# Patient Record
Sex: Male | Born: 1945
Health system: Southern US, Community
[De-identification: ages and names within clinical notes are randomized; demographics above are authoritative.]

## PROBLEM LIST (undated history)

## (undated) DIAGNOSIS — M1990 Unspecified osteoarthritis, unspecified site: Secondary | ICD-10-CM

## (undated) DIAGNOSIS — T4145XA Adverse effect of unspecified anesthetic, initial encounter: Secondary | ICD-10-CM

## (undated) DIAGNOSIS — H269 Unspecified cataract: Secondary | ICD-10-CM

## (undated) DIAGNOSIS — T8859XA Other complications of anesthesia, initial encounter: Secondary | ICD-10-CM

## (undated) DIAGNOSIS — I10 Essential (primary) hypertension: Secondary | ICD-10-CM

## (undated) DIAGNOSIS — G709 Myoneural disorder, unspecified: Secondary | ICD-10-CM

## (undated) DIAGNOSIS — K219 Gastro-esophageal reflux disease without esophagitis: Secondary | ICD-10-CM

## (undated) DIAGNOSIS — G4730 Sleep apnea, unspecified: Secondary | ICD-10-CM

## (undated) DIAGNOSIS — E785 Hyperlipidemia, unspecified: Secondary | ICD-10-CM

## (undated) HISTORY — PX: COLONOSCOPY: SHX174

## (undated) HISTORY — DX: Sleep apnea, unspecified: G47.30

## (undated) HISTORY — DX: Hyperlipidemia, unspecified: E78.5

## (undated) HISTORY — PX: JOINT REPLACEMENT: SHX530

## (undated) HISTORY — DX: Unspecified cataract: H26.9

## (undated) HISTORY — PX: CATARACT EXTRACTION: SUR2

## (undated) HISTORY — DX: Gastro-esophageal reflux disease without esophagitis: K21.9

---

## 1997-12-18 ENCOUNTER — Emergency Department (HOSPITAL_COMMUNITY): Admission: EM | Admit: 1997-12-18 | Discharge: 1997-12-18 | Payer: Self-pay | Admitting: Emergency Medicine

## 2000-02-23 ENCOUNTER — Ambulatory Visit (HOSPITAL_COMMUNITY): Admission: RE | Admit: 2000-02-23 | Discharge: 2000-02-24 | Payer: Self-pay | Admitting: Orthopedic Surgery

## 2000-02-23 ENCOUNTER — Encounter: Payer: Self-pay | Admitting: Orthopedic Surgery

## 2000-02-28 ENCOUNTER — Encounter: Admission: RE | Admit: 2000-02-28 | Discharge: 2000-05-28 | Payer: Self-pay | Admitting: Orthopedic Surgery

## 2002-02-20 ENCOUNTER — Encounter: Admission: RE | Admit: 2002-02-20 | Discharge: 2002-02-20 | Payer: Self-pay | Admitting: Family Medicine

## 2002-02-20 ENCOUNTER — Encounter: Payer: Self-pay | Admitting: Family Medicine

## 2002-10-14 ENCOUNTER — Inpatient Hospital Stay (HOSPITAL_COMMUNITY): Admission: RE | Admit: 2002-10-14 | Discharge: 2002-10-18 | Payer: Self-pay | Admitting: Orthopedic Surgery

## 2002-10-14 ENCOUNTER — Encounter: Payer: Self-pay | Admitting: Orthopedic Surgery

## 2004-04-26 ENCOUNTER — Ambulatory Visit: Payer: Self-pay | Admitting: Family Medicine

## 2004-05-03 ENCOUNTER — Ambulatory Visit: Payer: Self-pay | Admitting: Family Medicine

## 2005-02-09 ENCOUNTER — Emergency Department (HOSPITAL_COMMUNITY): Admission: EM | Admit: 2005-02-09 | Discharge: 2005-02-09 | Payer: Self-pay | Admitting: Emergency Medicine

## 2005-02-13 ENCOUNTER — Ambulatory Visit: Payer: Self-pay | Admitting: Family Medicine

## 2005-04-14 ENCOUNTER — Ambulatory Visit: Payer: Self-pay | Admitting: Family Medicine

## 2005-04-20 ENCOUNTER — Ambulatory Visit: Payer: Self-pay | Admitting: Family Medicine

## 2005-04-21 ENCOUNTER — Encounter: Admission: RE | Admit: 2005-04-21 | Discharge: 2005-04-21 | Payer: Self-pay | Admitting: Family Medicine

## 2005-05-02 ENCOUNTER — Ambulatory Visit: Payer: Self-pay | Admitting: Family Medicine

## 2005-06-22 ENCOUNTER — Ambulatory Visit: Admission: RE | Admit: 2005-06-22 | Discharge: 2005-06-22 | Payer: Self-pay | Admitting: Orthopedic Surgery

## 2005-07-18 ENCOUNTER — Ambulatory Visit: Payer: Self-pay | Admitting: Physical Medicine & Rehabilitation

## 2005-07-18 ENCOUNTER — Inpatient Hospital Stay (HOSPITAL_COMMUNITY): Admission: RE | Admit: 2005-07-18 | Discharge: 2005-07-23 | Payer: Self-pay | Admitting: Orthopedic Surgery

## 2005-07-18 ENCOUNTER — Encounter (INDEPENDENT_AMBULATORY_CARE_PROVIDER_SITE_OTHER): Payer: Self-pay | Admitting: *Deleted

## 2006-05-24 ENCOUNTER — Ambulatory Visit: Payer: Self-pay | Admitting: Family Medicine

## 2006-05-24 LAB — CONVERTED CEMR LAB
AST: 19 units/L (ref 0–37)
Alkaline Phosphatase: 108 units/L (ref 39–117)
Basophils Relative: 0.7 % (ref 0.0–1.0)
Calcium: 10.1 mg/dL (ref 8.4–10.5)
Chloride: 97 meq/L (ref 96–112)
Eosinophil percent: 3.4 % (ref 0.0–5.0)
GFR calc non Af Amer: 72 mL/min
Glomerular Filtration Rate, Af Am: 88 mL/min/{1.73_m2}
HDL: 41.2 mg/dL (ref >39.0)
Hemoglobin: 14.3 g/dL (ref 13.0–17.0)
LDL DIRECT: 153.4 mg/dL
Lymphocytes Relative: 45.6 % (ref 12.0–46.0)
MCHC: 34.4 g/dL (ref 30.0–36.0)
Monocytes Absolute: 0.5 10*3/uL (ref 0.2–0.7)
Monocytes Relative: 8.7 % (ref 3.0–11.0)
Neutro Abs: 2.3 10*3/uL (ref 1.4–7.7)
PSA: 1.18 ng/mL (ref 0.10–4.00)
Potassium: 4 meq/L (ref 3.5–5.1)
RDW: 11.4 % — ABNORMAL LOW (ref 11.5–14.6)
Total Protein: 7.6 g/dL (ref 6.0–8.3)
VLDL: 12 mg/dL (ref 0–40)

## 2006-06-06 ENCOUNTER — Ambulatory Visit: Payer: Self-pay | Admitting: Family Medicine

## 2006-07-02 ENCOUNTER — Ambulatory Visit: Payer: Self-pay | Admitting: Gastroenterology

## 2006-07-16 ENCOUNTER — Encounter: Payer: Self-pay | Admitting: Gastroenterology

## 2006-07-16 ENCOUNTER — Ambulatory Visit: Payer: Self-pay | Admitting: Gastroenterology

## 2006-07-16 ENCOUNTER — Encounter: Payer: Self-pay | Admitting: Family Medicine

## 2006-07-25 ENCOUNTER — Ambulatory Visit: Payer: Self-pay | Admitting: Family Medicine

## 2006-07-30 ENCOUNTER — Ambulatory Visit: Payer: Self-pay | Admitting: Family Medicine

## 2006-08-22 ENCOUNTER — Ambulatory Visit: Payer: Self-pay | Admitting: Family Medicine

## 2006-12-21 DIAGNOSIS — N259 Disorder resulting from impaired renal tubular function, unspecified: Secondary | ICD-10-CM | POA: Insufficient documentation

## 2006-12-21 DIAGNOSIS — I10 Essential (primary) hypertension: Secondary | ICD-10-CM | POA: Insufficient documentation

## 2006-12-27 ENCOUNTER — Telehealth: Payer: Self-pay | Admitting: Family Medicine

## 2007-05-06 ENCOUNTER — Ambulatory Visit: Payer: Self-pay | Admitting: Family Medicine

## 2007-05-06 LAB — CONVERTED CEMR LAB
AST: 18 units/L (ref 0–37)
Alkaline Phosphatase: 95 units/L (ref 39–117)
Blood in Urine, dipstick: NEGATIVE
CO2: 32 meq/L (ref 19–32)
Chloride: 96 meq/L (ref 96–112)
Cholesterol: 213 mg/dL (ref 0–200)
Creatinine, Ser: 1 mg/dL (ref 0.4–1.5)
Eosinophils Absolute: 0.2 10*3/uL (ref 0.0–0.6)
Eosinophils Relative: 2.8 % (ref 0.0–5.0)
GFR calc non Af Amer: 80 mL/min
Glucose, Urine, Semiquant: NEGATIVE
HDL: 34.9 mg/dL — ABNORMAL LOW (ref >39.0)
Lymphocytes Relative: 47 % — ABNORMAL HIGH (ref 12.0–46.0)
MCHC: 35.1 g/dL (ref 30.0–36.0)
Monocytes Absolute: 0.7 10*3/uL (ref 0.2–0.7)
Neutro Abs: 2.5 10*3/uL (ref 1.4–7.7)
Platelets: 268 10*3/uL (ref 150–400)
RBC: 4.4 M/uL (ref 4.22–5.81)
RDW: 11.4 % — ABNORMAL LOW (ref 11.5–14.6)
Sodium: 138 meq/L (ref 135–145)
Specific Gravity, Urine: 1.02
Triglycerides: 75 mg/dL (ref 0–149)
VLDL: 15 mg/dL (ref 0–40)
pH: 6.5

## 2007-05-17 ENCOUNTER — Ambulatory Visit: Payer: Self-pay | Admitting: Family Medicine

## 2007-05-17 DIAGNOSIS — M1710 Unilateral primary osteoarthritis, unspecified knee: Secondary | ICD-10-CM

## 2007-05-17 DIAGNOSIS — N529 Male erectile dysfunction, unspecified: Secondary | ICD-10-CM | POA: Insufficient documentation

## 2007-05-17 DIAGNOSIS — IMO0002 Reserved for concepts with insufficient information to code with codable children: Secondary | ICD-10-CM | POA: Insufficient documentation

## 2007-05-17 DIAGNOSIS — L301 Dyshidrosis [pompholyx]: Secondary | ICD-10-CM | POA: Insufficient documentation

## 2007-07-15 DIAGNOSIS — J45990 Exercise induced bronchospasm: Secondary | ICD-10-CM | POA: Insufficient documentation

## 2007-07-18 ENCOUNTER — Ambulatory Visit: Payer: Self-pay | Admitting: Family Medicine

## 2007-07-18 DIAGNOSIS — J069 Acute upper respiratory infection, unspecified: Secondary | ICD-10-CM | POA: Insufficient documentation

## 2007-08-12 DIAGNOSIS — R259 Unspecified abnormal involuntary movements: Secondary | ICD-10-CM | POA: Insufficient documentation

## 2007-08-14 ENCOUNTER — Ambulatory Visit: Payer: Self-pay | Admitting: Family Medicine

## 2007-10-31 ENCOUNTER — Ambulatory Visit: Payer: Self-pay | Admitting: Family Medicine

## 2007-10-31 LAB — CONVERTED CEMR LAB
Cholesterol: 188 mg/dL (ref 0–200)
HDL: 29.9 mg/dL — ABNORMAL LOW (ref >39.0)

## 2007-11-07 ENCOUNTER — Ambulatory Visit: Payer: Self-pay | Admitting: Family Medicine

## 2008-06-25 ENCOUNTER — Ambulatory Visit: Payer: Self-pay | Admitting: Family Medicine

## 2008-06-25 LAB — CONVERTED CEMR LAB
AST: 22 units/L (ref 0–37)
Albumin: 3.7 g/dL (ref 3.5–5.2)
Alkaline Phosphatase: 83 units/L (ref 39–117)
BUN: 14 mg/dL (ref 6–23)
Basophils Relative: 0.6 % (ref 0.0–3.0)
Bilirubin, Direct: 0.2 mg/dL (ref 0.0–0.3)
CO2: 33 meq/L — ABNORMAL HIGH (ref 19–32)
Cholesterol: 187 mg/dL (ref 0–200)
GFR calc Af Amer: 87 mL/min
Glucose, Urine, Semiquant: NEGATIVE
HCT: 40.4 % (ref 39.0–52.0)
HDL: 31.9 mg/dL — ABNORMAL LOW (ref >39.0)
Hemoglobin: 13.8 g/dL (ref 13.0–17.0)
LDL Cholesterol: 137 mg/dL — ABNORMAL HIGH (ref 0–99)
MCV: 93.9 fL (ref 78.0–100.0)
Monocytes Absolute: 0.6 10*3/uL (ref 0.1–1.0)
Neutro Abs: 2.5 10*3/uL (ref 1.4–7.7)
Neutrophils Relative %: 42.1 % — ABNORMAL LOW (ref 43.0–77.0)
Potassium: 3.9 meq/L (ref 3.5–5.1)
Specific Gravity, Urine: 1.015
Total Bilirubin: 1.3 mg/dL — ABNORMAL HIGH (ref 0.3–1.2)
Triglycerides: 90 mg/dL (ref 0–149)
VLDL: 18 mg/dL (ref 0–40)
WBC Urine, dipstick: NEGATIVE
pH: 7.5

## 2008-07-02 ENCOUNTER — Ambulatory Visit: Payer: Self-pay | Admitting: Family Medicine

## 2008-07-14 ENCOUNTER — Ambulatory Visit: Payer: Self-pay | Admitting: Family Medicine

## 2008-07-14 LAB — CONVERTED CEMR LAB
Bilirubin Urine: NEGATIVE
Glucose, Urine, Semiquant: NEGATIVE
Protein, U semiquant: NEGATIVE
Urobilinogen, UA: 1
WBC Urine, dipstick: NEGATIVE
pH: 6

## 2008-08-12 ENCOUNTER — Ambulatory Visit: Payer: Self-pay | Admitting: Family Medicine

## 2008-09-08 ENCOUNTER — Ambulatory Visit: Payer: Self-pay | Admitting: Family Medicine

## 2008-10-22 DIAGNOSIS — M5030 Other cervical disc degeneration, unspecified cervical region: Secondary | ICD-10-CM | POA: Insufficient documentation

## 2008-10-23 ENCOUNTER — Ambulatory Visit: Payer: Self-pay | Admitting: Family Medicine

## 2008-10-26 ENCOUNTER — Ambulatory Visit: Payer: Self-pay | Admitting: Family Medicine

## 2009-07-08 ENCOUNTER — Ambulatory Visit: Payer: Self-pay | Admitting: Family Medicine

## 2009-07-08 LAB — CONVERTED CEMR LAB
AST: 17 units/L (ref 0–37)
BUN: 12 mg/dL (ref 6–23)
Bilirubin, Direct: 0.1 mg/dL (ref 0.0–0.3)
CO2: 31 meq/L (ref 19–32)
Chloride: 101 meq/L (ref 96–112)
Cholesterol: 204 mg/dL — ABNORMAL HIGH (ref 0–200)
Creatinine, Ser: 1 mg/dL (ref 0.4–1.5)
Eosinophils Relative: 0.7 % (ref 0.0–5.0)
HDL: 45.2 mg/dL (ref >39.00)
Hemoglobin: 13.4 g/dL (ref 13.0–17.0)
Ketones, urine, test strip: NEGATIVE
MCHC: 33.6 g/dL (ref 30.0–36.0)
Monocytes Absolute: 0.8 10*3/uL (ref 0.1–1.0)
Monocytes Relative: 11.2 % (ref 3.0–12.0)
PSA: 1.55 ng/mL (ref 0.10–4.00)
RBC: 4.29 M/uL (ref 4.22–5.81)
RDW: 11 % — ABNORMAL LOW (ref 11.5–14.6)
Total CHOL/HDL Ratio: 5
Triglycerides: 71 mg/dL (ref 0.0–149.0)
Urobilinogen, UA: 4
VLDL: 14.2 mg/dL (ref 0.0–40.0)
WBC Urine, dipstick: NEGATIVE
pH: 5.5

## 2009-07-15 ENCOUNTER — Ambulatory Visit: Payer: Self-pay | Admitting: Family Medicine

## 2009-07-27 ENCOUNTER — Telehealth: Payer: Self-pay | Admitting: Family Medicine

## 2010-01-11 ENCOUNTER — Ambulatory Visit: Payer: Self-pay | Admitting: Family Medicine

## 2010-01-11 LAB — CONVERTED CEMR LAB
BUN: 14 mg/dL (ref 6–23)
Calcium: 9.8 mg/dL (ref 8.4–10.5)
Chloride: 102 meq/L (ref 96–112)
Creatinine, Ser: 1 mg/dL (ref 0.4–1.5)
Glucose, Bld: 105 mg/dL — ABNORMAL HIGH (ref 70–99)
Hgb A1c MFr Bld: 5.5 % (ref 4.6–6.5)
LDL Cholesterol: 136 mg/dL — ABNORMAL HIGH (ref 0–99)
VLDL: 13.2 mg/dL (ref 0.0–40.0)

## 2010-01-18 ENCOUNTER — Ambulatory Visit: Payer: Self-pay | Admitting: Family Medicine

## 2010-01-18 DIAGNOSIS — E739 Lactose intolerance, unspecified: Secondary | ICD-10-CM | POA: Insufficient documentation

## 2010-06-14 NOTE — Progress Notes (Signed)
Summary: rx sent  Phone Note Refill Request Message from:  Fax from Pharmacy on July 27, 2009 12:02 PM  Refills Requested: Medication #1:  LIDEX 0.05 %  CREA apply at bedtime Initial call taken by: Kern Reap CMA Duncan Dull),  July 27, 2009 12:04 PM    Prescriptions: LIDEX 0.05 %  CREA (FLUOCINONIDE) apply at bedtime  #60 grms. x 4   Entered by:   Kern Reap CMA (AAMA)   Authorized by:   Roderick Pee MD   Signed by:   Kern Reap CMA (AAMA) on 07/27/2009   Method used:   [facility] to        [id number]       [address]       [place], [place]  [address]       Ph: [phone] or [phone]       Fax: [phone]   RxID:   [id number]

## 2010-06-14 NOTE — Assessment & Plan Note (Signed)
Summary: 6 month rov/njr   Vital Signs:  Patient profile:   65 year old male Weight:      268 pounds Temp:     98 degrees F oral Pulse rate:   70 / minute Pulse rhythm:   regular BP sitting:   140 / 80  (right arm) Flu Vaccine Consent Questions     Do you have a history of severe allergic reactions to this vaccine? no    Any prior history of allergic reactions to egg and/or gelatin? no    Do you have a sensitivity to the preservative Thimersol? no    Do you have a past history of Guillan-Barre Syndrome? no    Do you currently have an acute febrile illness? no    Have you ever had a severe reaction to latex? no    Vaccine information given and explained to patient? yes    Are you currently pregnant? no    Lot Number:[id number]   Exp Date:11/12/2010   Site Given  Left Deltoid IM  History of Present Illness: [name] is a 65 year old, married male, nonsmoker, who comes in today for evaluation of hypertension, and glucose intolerance, and renal insufficiency.  His blood pressure and is treated with Norvasc 10 mg daily, hydrochlorothiazide 25 mg daily.  BP at home 139/80.  BP here today by me 140/80.  Right arm sitting position.  His lipids have normalized on diet and exercise.  Renal function is improved with a GFR of 94.  Fasting blood sugar 105 with a hemoglobin A1c of 5.5%.  He is on no medication for his diabetes.  He is controlling it with diet and daily walking.  Allergies: No Known Drug Allergies  Past History:  Past medical, surgical, family and social histories (including risk factors) reviewed for relevance to current acute and chronic problems.  Past Medical History: Reviewed history from 05/17/2007 and no changes required. 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 TKR-right Total knee replacement  L  Family  History: Reviewed history from 05/17/2007 and no changes required. Family History Hypertension  Social History: Reviewed history from 05/17/2007 and no changes required. Retired Married Never Smoked Alcohol use-no Drug use-no Regular exercise-yes  Review of Systems      See HPI  Physical Exam  General:  Well-developed,well-nourished,in no acute distress; alert,appropriate and cooperative throughout examination Heart:  140/80 right arm sitting position   Impression & Recommendations:  Problem # 1:  HYPERTENSION (ICD-401.9) Assessment Improved  His updated medication list for this problem includes:    Norvasc 10 Mg Tabs (Amlodipine besylate) .Marland Kitchen... Take 1 tablet by mouth every morning    Hydrochlorothiazide 25 Mg Tabs (Hydrochlorothiazide) .Marland Kitchen... Take 1 tablet by mouth every morning  Problem # 2:  RENAL INSUFFICIENCY (ICD-588.9) Assessment: Improved  Problem # 3:  IMPAIRED GLUCOSE TOLERANCE (ICD-271.3) Assessment: Improved  Complete Medication List: 1)  Adult Aspirin Ec Low Strength 81 Mg Tbec (Aspirin) 2)  Viagra 100 Mg Tabs (Sildenafil citrate) .... Uad 3)  Lidex 0.05 % Crea (Fluocinonide) .... Apply at bedtime 4)  Norvasc 10 Mg Tabs (Amlodipine besylate) .... Take 1 tablet by mouth every morning 5)  Hydrochlorothiazide 25 Mg Tabs (Hydrochlorothiazide) .... Take 1 tablet by mouth every morning 6)  Prednisone 20 Mg Tabs (Prednisone) .... Uad 7)  Flexeril 10 Mg Tabs (Cyclobenzaprine hcl) .... Take 1 tablet by mouth three times a day 8)  Vicodin Es 7.5-750 Mg Tabs (Hydrocodone-acetaminophen) .... Take 1 tablet by mouth three times a day  Other Orders: Admin 1st Vaccine (98119) Flu Vaccine 53yrs + ([address]782)  Patient Instructions: 1)  Please schedule a follow-up appointment in 6 months. 2)  BMP prior to visit, ICD-9: 3)  Hepatic Panel prior to visit, ICD-9: 4)  Lipid Panel prior to visit, ICD-9: 5)  TSH prior to visit, ICD-9: 6)  CBC w/ Diff prior to visit, ICD-9: 7)   PSA prior to visit, ICD-9: 8)  HbgA1C prior to visit, ICD-9:

## 2010-06-14 NOTE — Procedures (Signed)
Summary: Colonoscopy Report/[facility]  Colonoscopy Report/[facility]   Imported By: Maryln Gottron 10/06/2009 14:33:42  _____________________________________________________________________  External Attachment:    Type:   Image     Comment:   External Document

## 2010-06-14 NOTE — Assessment & Plan Note (Signed)
Summary: cpx//ccm   Vital Signs:  Patient profile:   65 year old male Height:      68.25 inches Weight:      268 pounds BMI:     40.60 Temp:     98.1 degrees F oral BP sitting:   150 / 90  (left arm) Cuff size:   regular  Vitals Entered By: Kern Reap CMA Duncan Dull) (July 15, 2009 2:38 PM)  Reason for Visit cpx  History of Present Illness: [name] is a 65 year old, married male, nonsmoker, who comes in today for evaluation of multiple issues.  Is a history of underlying erectile dysfunction for which he takes for her 100 mg p.r.n. working well.  He also has underlying hypertension.  He takes Norvasc 10 mg daily, hydrochlorothiazide, 25 mg daily, one adult aspirin daily.  BP at home 140/80.  He also has a history of renal insufficiency from his hypertension.  Will monitor renal functions.  He gets routine eye care, not dental care, colonoscopy, normal in GI, tetanus, 2004, seasonal flu 2010.  He has had both knees replaced for severe degenerative joint disease.  He is now able to walk and is begun losing weight.  Weight today 268  Allergies: No Known Drug Allergies  Past History:  Past medical, surgical, family and social histories (including risk factors) reviewed, and no changes noted (except as noted below).  Past Medical History: Reviewed history from 05/17/2007 and no changes required. 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 TKR-right Total knee replacement  L  Family History: Reviewed history from 05/17/2007 and no changes required. Family History Hypertension  Social History: Reviewed history from 05/17/2007 and no changes required. Retired Married Never Smoked Alcohol use-no Drug use-no Regular exercise-yes  Review of Systems      See HPI  Physical Exam  General:  Well-developed,well-nourished,in no  acute distress; alert,appropriate and cooperative throughout examination Head:  Normocephalic and atraumatic without obvious abnormalities. No apparent alopecia or balding. Eyes:  No corneal or conjunctival inflammation noted. EOMI. Perrla. Funduscopic exam benign, without hemorrhages, exudates or papilledema. Vision grossly normal. Ears:  External ear exam shows no significant lesions or deformities.  Otoscopic examination reveals clear canals, tympanic membranes are intact bilaterally without bulging, retraction, inflammation or discharge. Hearing is grossly normal bilaterally. Nose:  External nasal examination shows no deformity or inflammation. Nasal mucosa are pink and moist without lesions or exudates. Mouth:  Oral mucosa and oropharynx without lesions or exudates.  Teeth in good repair. Neck:  No deformities, masses, or tenderness noted. Chest Wall:  No deformities, masses, tenderness or gynecomastia noted. Breasts:  No masses or gynecomastia noted Lungs:  Normal respiratory effort, chest expands symmetrically. Lungs are clear to auscultation, no crackles or wheezes. Heart:  Normal rate and regular rhythm. S1 and S2 normal without gallop, murmur, click, rub or other extra sounds. Abdomen:  Bowel sounds positive,abdomen soft and non-tender without masses, organomegaly or hernias noted. Rectal:  No external abnormalities noted. Normal sphincter tone. No rectal masses or tenderness. Genitalia:  Testes bilaterally descended without nodularity, tenderness or masses. No scrotal masses or lesions. No penis lesions or urethral discharge. Prostate:  Prostate gland firm and smooth, no enlargement, nodularity, tenderness, mass, asymmetry or induration. Msk:  No deformity or scoliosis noted of thoracic or lumbar spine.   Pulses:  R and L carotid,radial,femoral,dorsalis pedis and posterior tibial pulses are full and  equal bilaterally Extremities:  No clubbing, cyanosis, edema, or deformity noted with  normal full range of motion of all joints.   Neurologic:  No cranial nerve deficits noted. Station and gait are normal. Plantar reflexes are down-going bilaterally. DTRs are symmetrical throughout. Sensory, motor and coordinative functions appear intact. Skin:  total body skin exam normal except for scars on both knees from previous total joint replacements Cervical Nodes:  No lymphadenopathy noted Axillary Nodes:  No palpable lymphadenopathy Inguinal Nodes:  No significant adenopathy Psych:  Cognition and judgment appear intact. Alert and cooperative with normal attention span and concentration. No apparent delusions, illusions, hallucinations   Impression & Recommendations:  Problem # 1:  ERECTILE DYSFUNCTION, ORGANIC (ICD-607.84) Assessment Improved  His updated medication list for this problem includes:    Viagra 100 Mg Tabs (Sildenafil citrate) ..... Uad  Orders: Prescription Created Electronically [phone])  Problem # 2:  DYSHIDROSIS (ICD-705.81) Assessment: Unchanged  Orders: Prescription Created Electronically ([phone])  Problem # 3:  RENAL INSUFFICIENCY (ICD-588.9) Assessment: Improved  Orders: Prescription Created Electronically ([phone])  Problem # 4:  HYPERTENSION (ICD-401.9) Assessment: Improved  His updated medication list for this problem includes:    Norvasc 10 Mg Tabs (Amlodipine besylate) .Marland Kitchen... Take 1 tablet by mouth every morning    Hydrochlorothiazide 25 Mg Tabs (Hydrochlorothiazide) .Marland Kitchen... Take 1 tablet by mouth every morning  Orders: Prescription Created Electronically ([phone]) EKG w/ Interpretation (93000)  Problem # 5:  Preventive Health Care (ICD-V70.0) Assessment: Unchanged  Complete Medication List: 1)  Adult Aspirin Ec Low Strength 81 Mg Tbec (Aspirin) 2)  Viagra 100 Mg Tabs (Sildenafil citrate) .... Uad 3)  Lidex 0.05 % Crea (Fluocinonide) .... Apply at bedtime 4)  Norvasc 10 Mg Tabs (Amlodipine besylate) .... Take 1 tablet by mouth every  morning 5)  Hydrochlorothiazide 25 Mg Tabs (Hydrochlorothiazide) .... Take 1 tablet by mouth every morning 6)  Prednisone 20 Mg Tabs (Prednisone) .... Uad 7)  Flexeril 10 Mg Tabs (Cyclobenzaprine hcl) .... Take 1 tablet by mouth three times a day 8)  Vicodin Es 7.5-750 Mg Tabs (Hydrocodone-acetaminophen) .... Take 1 tablet by mouth three times a day  Patient Instructions: 1)  continue your walking program and weight loss.  Follow-up in 6 months........... 2)  BMP prior to visit, ICD-9:               3)  HbgA1C prior to visit, ICD-9: 4)  Please schedule a follow-up appointment in 1 year. 5)  Schedule a colonoscopy/sigmoidoscopy to help detect colon cancer. 6)  Take an Aspirin every day. 7)  apply Lidex and over-the-counter antifungal cream to the area between your toes, nightly until the skin heals Prescriptions: HYDROCHLOROTHIAZIDE 25 MG TABS (HYDROCHLOROTHIAZIDE) Take 1 tablet by mouth every morning  #100 x 3   Entered and Authorized by:   Roderick Pee MD   Signed by:   Roderick Pee MD on 07/15/2009   Method used:   [facility]  [address] [id number]       [address]       [place], [place]  [address]       Ph: [phone] or [phone]       Fax: [phone]   RxID:   [id number] NORVASC 10 MG TABS (AMLODIPINE BESYLATE) Take 1 tablet by mouth every morning  #100 x 3   Entered and Authorized by:   Roderick Pee MD  Signed by:   Roderick Pee MD on 07/15/2009   Method used:   [facility]        [facility]  [address] [id number]       [address]       [place], [place]  [address]       Ph: [phone] or [phone]       Fax: [phone]   RxID:   [id number] LIDEX 0.05 %  CREA (FLUOCINONIDE) apply at bedtime  #60 grms. x 4   Entered and Authorized by:   Roderick Pee MD   Signed by:   Roderick Pee MD on 07/15/2009   Method used:   [facility]        [facility]  [id number]       [address]       [place], [place]  [address]       Ph: [phone] or [phone]       Fax: [phone]   RxID:   [id number] VIAGRA 100 MG  TABS (SILDENAFIL CITRATE) UAD  #6 x 11   Entered and Authorized by:   Roderick Pee MD   Signed by:   Roderick Pee MD on 07/15/2009   Method used:   [facility]        [facility]  [id number]       [address]       [place], [place]  [address]       Ph: [phone] or [phone]       Fax: [phone]   RxID:   [id number]    Immunization History:  Influenza Immunization History:    Influenza:  historical (02/12/2009)

## 2010-07-19 ENCOUNTER — Other Ambulatory Visit (INDEPENDENT_AMBULATORY_CARE_PROVIDER_SITE_OTHER): Payer: Medicare Other

## 2010-07-19 DIAGNOSIS — E119 Type 2 diabetes mellitus without complications: Secondary | ICD-10-CM

## 2010-07-19 DIAGNOSIS — D649 Anemia, unspecified: Secondary | ICD-10-CM

## 2010-07-19 DIAGNOSIS — R972 Elevated prostate specific antigen [PSA]: Secondary | ICD-10-CM

## 2010-07-19 DIAGNOSIS — E785 Hyperlipidemia, unspecified: Secondary | ICD-10-CM

## 2010-07-19 DIAGNOSIS — T887XXA Unspecified adverse effect of drug or medicament, initial encounter: Secondary | ICD-10-CM

## 2010-07-19 DIAGNOSIS — I10 Essential (primary) hypertension: Secondary | ICD-10-CM

## 2010-07-19 DIAGNOSIS — E039 Hypothyroidism, unspecified: Secondary | ICD-10-CM

## 2010-07-19 LAB — HEPATIC FUNCTION PANEL
ALT: 16 U/L (ref 0–53)
Albumin: 4.1 g/dL (ref 3.5–5.2)
Bilirubin, Direct: 0.1 mg/dL (ref 0.0–0.3)
Total Protein: 7.4 g/dL (ref 6.0–8.3)

## 2010-07-19 LAB — BASIC METABOLIC PANEL
Calcium: 9.8 mg/dL (ref 8.4–10.5)
Creatinine, Ser: 1 mg/dL (ref 0.4–1.5)
GFR: 95.24 mL/min (ref >60.00)
Glucose, Bld: 126 mg/dL — ABNORMAL HIGH (ref 70–99)
Potassium: 4.2 mEq/L (ref 3.5–5.1)

## 2010-07-19 LAB — CBC WITH DIFFERENTIAL/PLATELET
Eosinophils Absolute: 0.2 10*3/uL (ref 0.0–0.7)
HCT: 39.6 % (ref 39.0–52.0)
Hemoglobin: 13.8 g/dL (ref 13.0–17.0)
MCV: 91.7 fl (ref 78.0–100.0)
Platelets: 248 10*3/uL (ref 150.0–400.0)
RBC: 4.32 Mil/uL (ref 4.22–5.81)
RDW: 12.5 % (ref 11.5–14.6)

## 2010-07-19 LAB — HEMOGLOBIN A1C: Hgb A1c MFr Bld: 5.6 % (ref 4.6–6.5)

## 2010-07-19 LAB — LIPID PANEL
Cholesterol: 194 mg/dL (ref 0–200)
HDL: 40 mg/dL (ref >39.00)

## 2010-07-26 ENCOUNTER — Ambulatory Visit (INDEPENDENT_AMBULATORY_CARE_PROVIDER_SITE_OTHER): Payer: Medicare Other | Admitting: Family Medicine

## 2010-07-26 ENCOUNTER — Encounter: Payer: Self-pay | Admitting: Family Medicine

## 2010-07-26 DIAGNOSIS — L259 Unspecified contact dermatitis, unspecified cause: Secondary | ICD-10-CM

## 2010-07-26 DIAGNOSIS — L309 Dermatitis, unspecified: Secondary | ICD-10-CM

## 2010-07-26 DIAGNOSIS — Z0000 Encounter for general adult medical examination without abnormal findings: Secondary | ICD-10-CM

## 2010-07-26 DIAGNOSIS — I10 Essential (primary) hypertension: Secondary | ICD-10-CM

## 2010-07-26 DIAGNOSIS — E739 Lactose intolerance, unspecified: Secondary | ICD-10-CM

## 2010-07-26 DIAGNOSIS — Z23 Encounter for immunization: Secondary | ICD-10-CM

## 2010-07-26 DIAGNOSIS — N259 Disorder resulting from impaired renal tubular function, unspecified: Secondary | ICD-10-CM

## 2010-07-26 MED ORDER — FLUOCINONIDE 0.05 % EX CREA: TOPICAL_CREAM | Freq: Two times a day (BID) | CUTANEOUS | Status: DC

## 2010-07-26 MED ORDER — AMLODIPINE BESYLATE 10 MG PO TABS: 10.0000 mg | ORAL_TABLET | Freq: Every day | ORAL | Status: DC

## 2010-07-26 MED ORDER — HYDROCHLOROTHIAZIDE 25 MG PO TABS: 25.0000 mg | ORAL_TABLET | Freq: Every day | ORAL | Status: DC

## 2010-07-26 MED ORDER — PNEUMOCOCCAL VAC POLYVALENT 25 MCG/0.5ML IJ INJ: 0.5000 mL | INJECTION | Freq: Once | INTRAMUSCULAR | Status: DC

## 2010-07-26 NOTE — Progress Notes (Signed)
  Subjective:    Patient ID: [name], male    DOB: January 20, 1946, 65 y.o.   MRN: [id number]  HPI  [name] is a 65 year old male, who comes in today for evaluation of hypertension.  Seven long-standing history of hypertension, treated with hydrochlorothiazide 25 mg daily, and Norvasc, 10 mg daily, BP 122/82.  He also uses Lidex cream as needed for eczema.  He has bilateral total knee replacements.  Doing well.  No problems.  He continues to care for his elderly sister has had a stroke.  Vaccinations up to date.  Health maintenance up to date  Review of Systems  Constitutional: Negative.   HENT: Negative.   Eyes: Negative.   Respiratory: Negative.   Cardiovascular: Negative.   Gastrointestinal: Negative.   Genitourinary: Negative.   Musculoskeletal: Negative.   Skin: Negative.   Neurological: Negative.   Hematological: Negative.   Psychiatric/Behavioral: Negative.        Objective:   Physical Exam  Constitutional: He is oriented to person, place, and time. He appears well-developed and well-nourished.  HENT:  Head: Normocephalic and atraumatic.  Right Ear: External ear normal.  Left Ear: External ear normal.  Nose: Nose normal.  Mouth/Throat: Oropharynx is clear and moist.  Eyes: Conjunctivae and EOM are normal. Pupils are equal, round, and reactive to light.  Neck: Normal range of motion. Neck supple. No JVD present. No tracheal deviation present. No thyromegaly present.  Cardiovascular: Normal rate, regular rhythm, normal heart sounds and intact distal pulses.  Exam reveals no gallop and no friction rub.   No murmur heard. Pulmonary/Chest: Effort normal and breath sounds normal. No stridor. No respiratory distress. He has no wheezes. He has no rales. He exhibits no tenderness.  Abdominal: Soft. Bowel sounds are normal. He exhibits no distension and no mass. There is no tenderness. There is no rebound and no guarding.  Genitourinary: Rectum normal, prostate normal and  penis normal. Guaiac negative stool. No penile tenderness.  Musculoskeletal: Normal range of motion. He exhibits no edema and no tenderness.  Lymphadenopathy:    He has no cervical adenopathy.  Neurological: He is alert and oriented to person, place, and time. He has normal reflexes. No cranial nerve deficit. He exhibits normal muscle tone.  Skin: Skin is warm and dry. No rash noted. No erythema. No pallor.  Psychiatric: He has a normal mood and affect. His behavior is normal. Judgment and thought content normal.          Assessment & Plan:  Hypertension......... Continue current medications.  Eczema........... Continue steroid cream p.r.n.  Bilateral total knee replacements........... Continue weight loss, and exercise

## 2010-07-26 NOTE — Patient Instructions (Signed)
Continue current medications follow-up in one year 

## 2010-08-04 ENCOUNTER — Other Ambulatory Visit: Payer: Self-pay | Admitting: Family Medicine

## 2010-09-30 NOTE — H&P (Signed)
[place]. [facility]  Patient:    [name], [name]                      MRN: [id number] Adm. Date:  [id number] Attending:  Burnard Bunting Dictator:   Cammy Copa, M.D.                         History and Physical  CHIEF COMPLAINT: Right knee pain.  HISTORY OF PRESENT ILLNESS: [name] is an active working 65 year old patient with right knee osteoarthritis, who underwent his first Amviscinjection two week ago by Dr. Coralee North.  This was done without difficulty.  One week later he had a second Amvisc injection performed by Dr. Ophelia Charter DD:  02/23/00 TD:  02/24/00 Job: [id number]

## 2010-09-30 NOTE — Discharge Summary (Signed)
   [name], [name]                         ACCOUNT NO.:  [id number]   MEDICAL RECORD NO.:  [id number]                   PATIENT TYPE:  INP   LOCATION:  5027                                 FACILITY:  [facility]   PHYSICIAN:  Burnard Bunting, M.D.                 DATE OF BIRTH:  02/02/1946   DATE OF ADMISSION:  10/14/2002  DATE OF DISCHARGE:  10/18/2002                                 DISCHARGE SUMMARY   DISCHARGE DIAGNOSIS:  Right knee arthritis.   SECONDARY DIAGNOSIS:  Hypertension.   OPERATION:  Right total knee replacement performed October 14, 2002.   HOSPITAL COURSE:  [name] is a 65 year old male with right knee  arthritis who presents for total knee replacement.  The patient underwent  total knee replacement 10/14/2002 without complications.  The patient was  started on physical therapy for mobilization.  CPM was started  postprocedure.  The patient's motor and sensory functions to the foot was  intact.  Hematocrit was 30 on postop day #1.  The patient was started on  Coumadin for DVT prophylaxis.  Epidural was discontinued on postop day #2.  The patient achieved excellent range of motion prior to discharge, was  ambulating in the hall with walker.  The patient had an otherwise  unremarkable recovery.  Incision was intact at the time of discharge.   He was discharged to home in good condition with home health PT and will  follow up with me in one week for staple removal.   DISCHARGE MEDICATIONS:  1. Preadmission medications.  2. Coumadin.  3. Percocet.                                                Burnard Bunting, M.D.    GSD/MEDQ  D:  12/05/2002  T:  12/05/2002  Job:  [id number]

## 2010-09-30 NOTE — H&P (Signed)
[place]  Patient:    [name], [name]                      MRN: [id number] Adm. Date:  [id number] Attending:  Burnard Bunting                         History and Physical  CHIEF COMPLAINT:  Right knee pain.  HISTORY OF PRESENT ILLNESS:  [name] is a 65 year old patient with right knee osteoarthritis.  He underwent his first Synvisc injection two weeks ago by Dr. Coralee North without difficulty.  The patient had knee swelling after his second Synvisc injection one week later which required aspiration.  This was performed by Dr. Ophelia Charter.  The white count in this aspirate was 23,000.  No cultures were sent.  Two days ago, the patient had his third injection performed with no immediate problems.  The day after his injection, the patient presented to my clinic with significant right knee pain and swelling, but no fever or chills.  White count in the knee aspirate at that time had increased to 61,000 with a sed rate of 47 and a C-reactive protein 8.1. His white count was 8000.  Gram stain was negative for any organisms.  He presents now for diagnostic and operative arthroscopy.  CURRENT MEDICATIONS:  Antihypertensive medications.  ALLERGIES:  He denies any allergies.  PHYSICAL EXAMINATION:  HEENT:  Normal.  CHEST:  Clear to auscultation.  HEART:  Regular rate and rhythm.  ABDOMEN:  Benign.  EXTREMITIES:  Right knee demonstrates recurrent effusion and range of motion 0 to 90 which is painful.  He does have palpable pedal pulses.  No inguinal lymphadenopathy.  LABORATORY DATA:  Plain x-rays show tricompartmental osteoarthritis but no focal osteolytic lesions.  White count in the aspirated fluid was 61,000, sed rate 47, C-reactive protein 8.1.  IMPRESSION:  Possible infection versus Synvisc flare.  PLAN:  Because of the increasing white count in the knee aspirate, plan to do diagnostic and operative arthroscopy and prophylactic  antibiotics.  We will obtain an ID consult as well.  The patient was explained the risks and benefits of the procedure and will proceed with surgery. DD:  02/23/00 TD:  02/24/00 Job: [id number]

## 2010-09-30 NOTE — Op Note (Signed)
[name], [name]               ACCOUNT NO.:  [id number]   MEDICAL RECORD NO.:  [id number]          PATIENT TYPE:  INP   LOCATION:  5003                         FACILITY:  [facility]   PHYSICIAN:  Burnard Bunting, M.D.    DATE OF BIRTH:  1945/08/21   DATE OF PROCEDURE:  07/18/2005  DATE OF DISCHARGE:  07/23/2005                                 OPERATIVE REPORT   PREOPERATIVE DIAGNOSIS:  Left knee arthritis.   POSTOPERATIVE DIAGNOSIS:  Left knee arthritis.   OPERATION PERFORMED:  Left total knee replacement.   SURGEON:  Burnard Bunting, M.D.   ASSISTANT:  Jerolyn Shin. Tresa Res, M.D.   ANESTHESIA:  General endotracheal.   ESTIMATED BLOOD LOSS:  200 mL.   DRAINS:  None.   TOURNIQUET TIME:  Two hours at 300 mmHg with 30 minute let down followed by  15 more minutes at 300 mmHg.   IMPLANTS:  DePuy posterior stabilized cemented 5 femur, 5 tibia, 38 patella  thin poly.  Surgery was computer assisted.   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   DESCRIPTION OF PROCEDURE:  The patient was brought to the operating room  where general endotracheal anesthesia was induced.  Preoperative antibiotics  were administered.  Left leg was prepped with DuraPrep solution including  the foot, draped in sterile manner.  Collier Flowers was used to cover the operative  field.  The left leg was then elevated and exsanguinated with the Esmarch  wrap.  Tourniquet was inflated.  Anterior approach to the knee was utilized.  Skin and subcutaneous tissue sharply divided.  The median parapatellar  arthrotomy was made.  Precise location of this arthrotomy was marked with #1  Vicryl suture.  Thickened synovium was resected.  The synovial hypertrophy  was consistent with PVNS and therefore a complete synovectomy was performed.  The lateral patellofemoral ligament was released.  Fat pad was partially  excised. Soft tissue was resected from the anterior distal aspect of the  femur.  Two  pins were then placed in the distal anterior femur and proximal  anteromedial tibia.  Registration points including the hip center rotation,  medial and lateral malleoli and various points around the knee were  obtained.  Using computer assisted templating and planning, tibial resection  was performed followed by ligament balancing in extension and flexion.  Distal femoral cut was then made.  Both the tibia and the femur had to be  recut because of excessive tightness of the soft tissues because of the  patient's severe arthritis.  Chamfer and box cut was then performed.  Tibial  trial punch was then performed and trial reduction was made.  Patient was  noted to have fully extension, excellent alignment with the free hand  patellar cut made and a size 38 patellar trial in place.  Knee had excellent  tracking and full range of motion with good stability.  Trial components  were removed at this time.  Tourniquet was released.  Bleeding points  encountered controlled using bipolar electrocautery.  The tourniquet was  then reinflated after 30 minutes of  being down and the components were then  cemented into position with the 10 mm spacer which gave excellent range of  motion and stability in both extension, midflexion and full flexion.  After  cement was hardened and excess cement was removed, a tourniquet was again  released.  Incision was thoroughly irrigated and closed over a Hemovac drain  using a #1 Vicryl suture to reapproximate the median parapatellar arthrotomy  followed by interrupted inverted 2-0 Vicryl suture and skin staples.  The  patient tolerated the procedure well without immediate complications.           ______________________________  G. Dorene Grebe, M.D.     GSD/MEDQ  D:  07/25/2005  T:  07/27/2005  Job:  [id number]

## 2010-09-30 NOTE — Op Note (Signed)
. [facility]  Patient:    [name], [name]                      MRN: [id number] Proc. Date: 02/23/00 Adm. Date:  [id number] Disc. Date: [id number] Attending:  Burnard Bunting                           Operative Report  PREOPERATIVE DIAGNOSES:  Right knee infection versus Synvisc flare.  POSTOPERATIVE DIAGNOSES:  Right knee infection versus Synvisc flare.  PROCEDURE:  Right knee diagnostic and operative arthroscopy with irrigation and debridement.  SURGEON:  Dorene Grebe, M.D.  ANESTHESIA:  General endotracheal.  ESTIMATED BLOOD LOSS:  10 cc.  DRAINS:  Hemovac x 1.  CULTURES:  About 100 cc of knee effusion was sent for culture and analysis as well as cell count.  INDICATIONS FOR PROCEDURE:  [name] is a 65 year old patient who developed right knee swelling following his third Synvisc injection 2 days ago. Cell count and fluid was 61,000 white cells. He presents now for arthroscopic knee washout.  OPERATIVE FINDINGS: 1. Examination under anesthesia, range of motion 0-110 degrees with large    effusion. 2. Diagnostic and operative arthroscopy, 100 cc of serous quality effusion    sent for culture. 3. General synovitis in the suprapatellar, ______ gutters and retropatellar    notch region. 4. 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 5. Intact anterior cruciate ligament and posterior cruciate ligament although    the anterior cruciate ligament was sprayed. 6. Grade 4 chondromalacia over about 50% of the weightbearing surface of the    lateral tibial plateau and lateral femoral condyle. 7. Grade 2 chondromalacia on the trochlea and on the under surface of the    patella.  DESCRIPTION OF PROCEDURE:  The patient was brought to the operating room where general endotracheal anesthesia was induced. The right leg was prepped  with duraprep solution and draped in a sterile manner. The anterior superior lateral portal was established and 100 cc of serous clotty effusion was obtained and this was sent for culture and cell count. The anterior inferolateral portal was established and the anterior inferomedial portal was established under direct vision. Systematic examination of the knee joint was performed. Grade 4 chondromalacia was noted in the medial and lateral compartments. Grade 2 chondromalacia was noted in the patellofemoral compartments. No loose bodies were seen. There was some free cartilage fragments within the aspirate itself. Generalized synovitis was noted throughout the knee. The shaver was introduced and thus some loose synovitis was debrided. Following the debridement within the intercondylar notch area, the medial and lateral gutters as well as suprapatellar pouch, the knee was irrigated with a total of 10 liters of irrigating solution. A drain was placed through the anterior, superior and lateral portal. Portals were closed using 3-0 nylon suture. The patient tolerated the procedure. The patient was then injected with 10 cc of Marcaine and 4 mg of morphine. Hemovac was charged in the recovery room. A bulky dressing was placed. The patient tolerated the procedure well without immediate complications. DD:  02/23/00 TD:  02/25/00 Job: [id number]

## 2010-09-30 NOTE — Op Note (Signed)
NAMEREESE, STOCKMAN                         ACCOUNT NO.:  [id number]   MEDICAL RECORD NO.:  [id number]                   PATIENT TYPE:  INP   LOCATION:  5027                                 FACILITY:  [facility]   PHYSICIAN:  Burnard Bunting, M.D.                 DATE OF BIRTH:  1945/12/14   DATE OF PROCEDURE:  10/17/2002  DATE OF DISCHARGE:                                 OPERATIVE REPORT   PREOPERATIVE DIAGNOSIS:  Right knee arthritis.   POSTOPERATIVE DIAGNOSIS:  Right knee arthritis.   OPERATION PERFORMED:  Right total knee arthroplasty.   SURGEON:  Burnard Bunting, M.D.   ASSISTANT:  Jerolyn Shin. Tresa Res, M.D.   ANESTHESIA:  General endotracheal plus postoperative epidural.   DRAINS:  Hemovac times one.   TOURNIQUET TIME:  Two hours at followed by release for approximately  30 minutes and reinflation for 35 minutes at .   COMPONENT SIZES:  Patella size 7, tibial insert 10 mm, femoral component  size 11, tibial tray size 9.   DESCRIPTION OF PROCEDURE:  The patient was brought to the operating room  where general endotracheal anesthesia was induced.  Preoperative intravenous  antibiotics were administered.  The right leg was prepped with DuraPrep  solution down to the foot including the foot and draped in a sterile manner.  Collier Flowers was used to cover the operative field.  The leg was elevated and  exsanguinated with the Esmarch wrap and the tourniquet was inflated.  The  anterior longitudinal midline incision was made and the skin and  subcutaneous tissue sharply divided.  Median parapatellar arthrotomy was  made.  The precise location of the arthrotomy was marked with a 0 Vicryl  suture.  This patient's knee was very right.  Patella eversion was  difficult. Patellar preparation was undertaken first.  The patella was  clamped and approximately 10 to 12 mm was resected in a free hand manner.  Osteophytes were then removed from the medial and lateral femoral condyles.  The lateral patellofemoral ligament was released.  Soft tissue was cleared  from the distal anterior aspect of the femur.  ACL and PCL were released.  The periosteal soft tissue sleeve was released medially back to the  semimembranosus bursa.  At this time with the retractors in position, the  distal cutting guide was placed in 5 degrees of valgus, 12 mm resection was  performed.  At this time the tibia was prepared.  The leveling step was  performed and osteophytes were removed from the medial and lateral plateau  surfaces.  The patient had an interesting wear pattern on the medial and  lateral plateau with essentially slightly more wear laterally.  Nonetheless  the intramedullary alignment guide was placed and a size 9 tibial tray was  noted to fit nicely on the femur.  About a 10 mm resection was based off the  least effective medial  plateau.  This cut was performed with the posterior  retractor and lateral ligament retractors in position.  Box cut was then  placed on the femur.  The patellar button was also placed.  The patient was  found to have a good knee range of motion with full extension and 120  degrees of flexion with no lift off.  Patellar tracking was excellent.  Caliper measured patellar height was decreased by 2 mm following resection  and placement of trial prosthesis.  At this time the tourniquet was  released.  Further soft tissue preparation including removal of posterior  osteophytes was performed.  The tourniquet was reinflated approximately 25  minutes later. The cut bony surfaces were irrigated and components were  cemented into position.  After cementing components into position, the  excess cement was removed.  The knee again had excellent range of motion and  good stability to varus and valgus stress with full extension and flexion.  At this time the bleeding points encountered were controlled with  electrocautery.  Hemovac drain was placed.  Parapatellar  arthrotomy was  closed with the knee in slight flexion over a bolster using #1 Vicryl figure-  of-eight suture.  Skin and subcutaneous tissue were closed using interrupted  inverted 2-0 Vicryl suture and skin staples. Marcaine  morphine mixture was  placed into the knee.  The patient was then placed a knee immobilizer.  The  patient tolerated the procedure well without immediate complications.                                                Burnard Bunting, M.D.    GSD/MEDQ  D:  10/17/2002  T:  10/17/2002  Job:  [id number]

## 2010-09-30 NOTE — Discharge Summary (Addendum)
NAMEAERIC, BURNHAM               ACCOUNT NO.:  [id number]   MEDICAL RECORD NO.:  [id number]          PATIENT TYPE:  INP   LOCATION:  5003                         FACILITY:  [facility]   PHYSICIAN:  Burnard Bunting, M.D.    DATE OF BIRTH:  09/25/1945   DATE OF ADMISSION:  07/18/2005  DATE OF DISCHARGE:  07/23/2005                                 DISCHARGE SUMMARY   DISCHARGE DIAGNOSIS:  Left knee arthritis.   SECONDARY DIAGNOSIS:  Hypertension.   OPERATIONS AND CLINICAL ____ QA MARKER: 35#1:DICTATOR EFFECT ____        PROCEDURES:  Left total knee replacement performed July 18, 2005.   HOSPITAL COURSE:  Damondre Pfeifle is a 65 year old patient with end-stage left  knee arthritis.  He has done well with his right total knee replacement.  The patient underwent left total knee replacement on July 18, 2005.  He  tolerated the procedure well without any complications.  It was transferred  to the recovery room in stable condition where he was noted to have intact  pedal pulses and dorsiflexion of his foot.  The patient was started on  Coumadin for DVT prophylaxis and physical therapy for mobilization.  Hematocrit was 25 on postop day #2.  INR responded appropriately by that  time.  Incision was intact on postop day #3.  He is to start on a CPM  machine for range of motion.  He was ambulating in the halls by the time of  discharge.  He received 1 unit of packed red blood cell transfusion on July 21, 2005 for a hematocrit of 22.  He did have 2-3 days of ileus, which  resolved with DC of pain medication.  The patient, otherwise, had an  unremarkable recovery.  He was DC'd home in good condition on July 23, 2005, weight bearing as tolerated with a CPM machine.  He will follow up  with me in 1 week for staple removal.   DISCHARGE MEDICATIONS:  1.  Coumadin with INR 2 to 2.5.  2.  Percocet for pain.  3.  Robaxin as a muscle relaxer.  4.  Atenolol/hydrochlorothiazide 50/25 mg 1/2 tab  daily.           ______________________________  G. Dorene Grebe, M.D.     GSD/MEDQ  D:  09/14/2005  T:  09/14/2005  Job:  [id number]

## 2011-04-17 ENCOUNTER — Other Ambulatory Visit: Payer: Self-pay | Admitting: Family Medicine

## 2011-05-17 NOTE — H&P (Signed)
NAME:  RAIFORD, FETTERMAN                    ACCOUNT NO.:  MEDICAL RECORD NO.:  [id number]  LOCATION:                                 FACILITY:  PHYSICIAN:  Burnard Bunting, M.D.         DATE OF BIRTH:  DATE OF ADMISSION: DATE OF DISCHARGE:                             HISTORY & PHYSICAL   CHIEF COMPLAINT:  Right knee swelling.  HISTORY OF PRESENT ILLNESS:  Sunil Hue is a 66 year old patient who underwent right total knee replacement on October 17, 2002.  He has done well with that.  Reports no pain or symptoms with the right knee.  Does have some occasional swelling in the knee.  He is currently taking no pain medications.  He had left total knee replacement on July 18, 2005. He has done well with that.  He is a former smoker.  I have been following the right knee for decreased spacing of the polyethylene spacer.  This is a fixed-bearing Stryker total knee replacement.  In general, the patient is having no fever and chills and has no symptoms related to his knees.  She currently does not smoke.  Please see the orthopedic patient history form for pertinent patient information.  All systems reviewed are negative as they relate to the knee.  PHYSICAL EXAMINATION:  GENERAL:  He is well developed, well nourished, no acute distress.  Alert and oriented, some increased body mass index. Normal gait alignment. CHEST:  Clear to auscultation. HEART:  Regular rate and rhythm. ABDOMEN:  Benign. HEENT:  Normal. EXTREMITIES:  Right knee demonstrates excellent range of motion.  Full extension to 110 of flexion.  There is an effusion in the right knee. No effusion in the left.  There is no proximal lymphadenopathy on the right.  Pedal pulses are palpable on the right-hand side.  Collaterals are stable at 0, 30, and 90 degrees bilaterally.  Radiographs are reviewed.  Two views of the knee does did show some fairly significant decreasing joint space on the right knee with progressive narrowing of the  polyethylene space.  IMPRESSION:  Right total knee arthroplasty polyethylene spacer wear with increasing effusion.  There is no evidence of osteolysis in any of the bony areas in the distal femur or proximal tibia.  PLAN:  Before this becomes metal on metal, Kennett needs to have his liner exchanged.  There is no evidence of loosening either radiographically or clinically.  Risks and benefits of procedure discussed with the patient including but not limited to infection, knee stiffness, nerve and vessel damage, recovery which would be similar to but not quite as extensive as that from the total knee replacement was also discussed.  The patient understands and will proceed with surgery sometime in January.  All questions were answered.     Burnard Bunting, M.D.     GSD/MEDQ  D:  05/08/2011  T:  05/09/2011  Job:  [id number]

## 2011-05-18 ENCOUNTER — Other Ambulatory Visit (HOSPITAL_COMMUNITY): Payer: Self-pay | Admitting: Orthopedic Surgery

## 2011-05-23 ENCOUNTER — Encounter (HOSPITAL_COMMUNITY): Payer: Self-pay | Admitting: Pharmacy Technician

## 2011-05-25 ENCOUNTER — Other Ambulatory Visit (HOSPITAL_COMMUNITY): Payer: Self-pay | Admitting: *Deleted

## 2011-05-25 ENCOUNTER — Encounter (HOSPITAL_COMMUNITY): Payer: Self-pay

## 2011-05-25 ENCOUNTER — Encounter (HOSPITAL_COMMUNITY)
Admission: RE | Admit: 2011-05-25 | Discharge: 2011-05-25 | Disposition: A | Discharge status: Home / Self Care | Payer: Medicare Other | Source: Ambulatory Visit | Attending: Orthopedic Surgery | Admitting: Orthopedic Surgery

## 2011-05-25 HISTORY — DX: Essential (primary) hypertension: I10

## 2011-05-25 HISTORY — DX: Unspecified osteoarthritis, unspecified site: M19.90

## 2011-05-25 LAB — BASIC METABOLIC PANEL
CO2: 28 mEq/L (ref 19–32)
Chloride: 100 mEq/L (ref 96–112)
Glucose, Bld: 134 mg/dL — ABNORMAL HIGH (ref 70–99)
Potassium: 3.9 mEq/L (ref 3.5–5.1)
Sodium: 137 mEq/L (ref 135–145)

## 2011-05-25 LAB — CBC
Hemoglobin: 13.9 g/dL (ref 13.0–17.0)
MCH: 30.9 pg (ref 26.0–34.0)
MCV: 86.9 fL (ref 78.0–100.0)
Platelets: 272 10*3/uL (ref 150–400)
RBC: 4.5 MIL/uL (ref 4.22–5.81)
WBC: 5.7 10*3/uL (ref 4.0–10.5)

## 2011-05-25 LAB — DIFFERENTIAL
Eosinophils Absolute: 0.2 10*3/uL (ref 0.0–0.7)
Eosinophils Relative: 4 % (ref 0–5)
Lymphocytes Relative: 44 % (ref 12–46)
Lymphs Abs: 2.5 10*3/uL (ref 0.7–4.0)
Monocytes Relative: 10 % (ref 3–12)

## 2011-05-25 LAB — URINALYSIS, ROUTINE W REFLEX MICROSCOPIC
Hgb urine dipstick: NEGATIVE
Leukocytes, UA: NEGATIVE
Protein, ur: NEGATIVE mg/dL
Specific Gravity, Urine: 1.014 (ref 1.005–1.030)
Urobilinogen, UA: 4 mg/dL — ABNORMAL HIGH (ref 0.0–1.0)

## 2011-05-25 LAB — URINE CULTURE

## 2011-05-25 LAB — TYPE AND SCREEN
ABO/RH(D): B POS
Antibody Screen: NEGATIVE

## 2011-05-25 LAB — SURGICAL PCR SCREEN: Staphylococcus aureus: NEGATIVE

## 2011-05-25 NOTE — Pre-Procedure Instructions (Signed)
20 [name]  05/25/2011   Your procedure is scheduled on: 05-30-2011 @ 11:29 AM Report to [facility] at 9:30 AM.  Call this number if you have problems the morning of surgery: [phone]   Remember:   Do not eat food:After Midnight.  May have clear liquids: up to 4 Hours before arrival.  Clear liquids include soda, tea, black coffee, apple or grape juice, broth. Until 5:30 AM  Take these medicines the morning of surgery with A SIP OF WATER:Norvasc   Do not wear jewelry, make-up or nail polish.  Do not wear lotions, powders, or perfumes. You may wear deodorant.  Do not shave 48 hours prior to surgery.  Do not bring valuables to the hospital.  Contacts, dentures or bridgework may not be worn into surgery.  Leave suitcase in the car. After surgery it may be brought to your room.  For patients admitted to the hospital, checkout time is 11:00 AM the day of discharge.      Special Instructions: CHG Shower Use Special Wash: 1/2 bottle night before surgery and 1/2 bottle morning of surgery.   Please read over the following fact sheets that you were given: Pain Booklet, Blood Transfusion Information, MRSA Information and Surgical Site Infection Prevention

## 2011-05-29 MED ORDER — CEFAZOLIN SODIUM-DEXTROSE 2-3 GM-% IV SOLR
2.0000 g | INTRAVENOUS | Status: AC
  Administered 2011-05-30: 2 g via INTRAVENOUS
  Filled 2011-05-29: qty 50

## 2011-05-29 MED ORDER — CHLORHEXIDINE GLUCONATE 4 % EX LIQD: 60.0000 mL | Freq: Once | CUTANEOUS | Status: DC

## 2011-05-30 ENCOUNTER — Encounter (HOSPITAL_COMMUNITY): Payer: Self-pay | Admitting: Anesthesiology

## 2011-05-30 ENCOUNTER — Ambulatory Visit (HOSPITAL_COMMUNITY): Payer: Medicare Other

## 2011-05-30 ENCOUNTER — Inpatient Hospital Stay (HOSPITAL_COMMUNITY)
Admission: RE | Admit: 2011-05-30 | Discharge: 2011-06-02 | DRG: 489 | Disposition: A | Discharge status: Home Health | Payer: Medicare Other | Source: Ambulatory Visit | Attending: Orthopedic Surgery | Admitting: Orthopedic Surgery

## 2011-05-30 ENCOUNTER — Encounter (HOSPITAL_COMMUNITY): Payer: Self-pay

## 2011-05-30 ENCOUNTER — Encounter (HOSPITAL_COMMUNITY)
Admission: RE | Disposition: A | Discharge status: Home Health | Payer: Self-pay | Source: Ambulatory Visit | Attending: Orthopedic Surgery

## 2011-05-30 ENCOUNTER — Ambulatory Visit (HOSPITAL_COMMUNITY): Payer: Medicare Other | Admitting: Anesthesiology

## 2011-05-30 ENCOUNTER — Other Ambulatory Visit: Payer: Self-pay

## 2011-05-30 DIAGNOSIS — Z87891 Personal history of nicotine dependence: Secondary | ICD-10-CM

## 2011-05-30 DIAGNOSIS — Z96659 Presence of unspecified artificial knee joint: Secondary | ICD-10-CM

## 2011-05-30 DIAGNOSIS — Z01812 Encounter for preprocedural laboratory examination: Secondary | ICD-10-CM

## 2011-05-30 DIAGNOSIS — Y92009 Unspecified place in unspecified non-institutional (private) residence as the place of occurrence of the external cause: Secondary | ICD-10-CM

## 2011-05-30 DIAGNOSIS — T8489XA Other specified complication of internal orthopedic prosthetic devices, implants and grafts, initial encounter: Principal | ICD-10-CM | POA: Diagnosis present

## 2011-05-30 DIAGNOSIS — I10 Essential (primary) hypertension: Secondary | ICD-10-CM | POA: Diagnosis present

## 2011-05-30 DIAGNOSIS — N259 Disorder resulting from impaired renal tubular function, unspecified: Secondary | ICD-10-CM

## 2011-05-30 DIAGNOSIS — Y831 Surgical operation with implant of artificial internal device as the cause of abnormal reaction of the patient, or of later complication, without mention of misadventure at the time of the procedure: Secondary | ICD-10-CM | POA: Diagnosis present

## 2011-05-30 DIAGNOSIS — T84063A Wear of articular bearing surface of internal prosthetic left knee joint, initial encounter: Secondary | ICD-10-CM

## 2011-05-30 HISTORY — DX: Adverse effect of unspecified anesthetic, initial encounter: T41.45XA

## 2011-05-30 HISTORY — DX: Other complications of anesthesia, initial encounter: T88.59XA

## 2011-05-30 HISTORY — DX: Myoneural disorder, unspecified: G70.9

## 2011-05-30 HISTORY — PX: TOTAL KNEE REVISION: SHX996

## 2011-05-30 SURGERY — TOTAL KNEE REVISION
Anesthesia: General | Site: Knee | Laterality: Right | Wound class: Clean

## 2011-05-30 MED ORDER — ACETAMINOPHEN 325 MG PO TABS
650.0000 mg | ORAL_TABLET | Freq: Four times a day (QID) | ORAL | Status: DC | PRN
  Administered 2011-06-01: 650 mg via ORAL
  Filled 2011-05-30: qty 2

## 2011-05-30 MED ORDER — BUPIVACAINE-EPINEPHRINE 0.5% -1:200000 IJ SOLN
INTRAMUSCULAR | Status: DC | PRN
  Administered 2011-05-30: 20 mL

## 2011-05-30 MED ORDER — RIVAROXABAN 10 MG PO TABS
10.0000 mg | ORAL_TABLET | Freq: Every day | ORAL | Status: DC
  Administered 2011-05-31 – 2011-06-02 (×3): 10 mg via ORAL
  Filled 2011-05-30 (×4): qty 1

## 2011-05-30 MED ORDER — ONDANSETRON HCL 4 MG PO TABS: 4.0000 mg | ORAL_TABLET | Freq: Four times a day (QID) | ORAL | Status: DC | PRN

## 2011-05-30 MED ORDER — MORPHINE SULFATE 4 MG/ML IJ SOLN
INTRAMUSCULAR | Status: DC | PRN
  Administered 2011-05-30: 8 mg via INTRAVENOUS

## 2011-05-30 MED ORDER — MENTHOL 3 MG MT LOZG: 1.0000 | LOZENGE | OROMUCOSAL | Status: DC | PRN

## 2011-05-30 MED ORDER — METOCLOPRAMIDE HCL 10 MG PO TABS
5.0000 mg | ORAL_TABLET | Freq: Three times a day (TID) | ORAL | Status: DC | PRN
Start: 2011-05-30 — End: 2011-06-02

## 2011-05-30 MED ORDER — MORPHINE SULFATE 2 MG/ML IJ SOLN
1.0000 mg | INTRAMUSCULAR | Status: DC | PRN
  Administered 2011-05-30 – 2011-05-31 (×2): 1 mg via INTRAVENOUS
  Filled 2011-05-30 (×2): qty 1

## 2011-05-30 MED ORDER — 0.9 % SODIUM CHLORIDE (POUR BTL) OPTIME
TOPICAL | Status: DC | PRN
  Administered 2011-05-30: 1000 mL

## 2011-05-30 MED ORDER — METHOCARBAMOL 500 MG PO TABS
500.0000 mg | ORAL_TABLET | Freq: Four times a day (QID) | ORAL | Status: DC | PRN
  Administered 2011-05-31 – 2011-06-02 (×3): 500 mg via ORAL
  Filled 2011-05-30 (×4): qty 1

## 2011-05-30 MED ORDER — ASPIRIN 81 MG PO TABS: 81.0000 mg | ORAL_TABLET | Freq: Every day | ORAL | Status: DC

## 2011-05-30 MED ORDER — POTASSIUM CHLORIDE IN NACL 20-0.9 MEQ/L-% IV SOLN
INTRAVENOUS | Status: DC
Start: 2011-05-30 — End: 2011-06-02
  Administered 2011-05-30: 1000 mL via INTRAVENOUS
  Administered 2011-05-31: 04:00:00 via INTRAVENOUS
  Filled 2011-05-30 (×8): qty 1000

## 2011-05-30 MED ORDER — PROMETHAZINE HCL 25 MG/ML IJ SOLN: 6.2500 mg | INTRAMUSCULAR | Status: DC | PRN

## 2011-05-30 MED ORDER — ONDANSETRON HCL 4 MG/2ML IJ SOLN
4.0000 mg | Freq: Four times a day (QID) | INTRAMUSCULAR | Status: DC | PRN
  Administered 2011-06-01: 4 mg via INTRAVENOUS
  Filled 2011-05-30 (×2): qty 2

## 2011-05-30 MED ORDER — LACTATED RINGERS IV SOLN: INTRAVENOUS | Status: DC

## 2011-05-30 MED ORDER — PROPOFOL 10 MG/ML IV EMUL
INTRAVENOUS | Status: DC | PRN
  Administered 2011-05-30: 200 mg via INTRAVENOUS

## 2011-05-30 MED ORDER — ASPIRIN EC 81 MG PO TBEC
81.0000 mg | DELAYED_RELEASE_TABLET | Freq: Every day | ORAL | Status: DC
  Administered 2011-05-31 – 2011-06-02 (×2): 81 mg via ORAL
  Filled 2011-05-30 (×3): qty 1

## 2011-05-30 MED ORDER — ROCURONIUM BROMIDE 100 MG/10ML IV SOLN
INTRAVENOUS | Status: DC | PRN
  Administered 2011-05-30: 50 mg via INTRAVENOUS

## 2011-05-30 MED ORDER — PHENYLEPHRINE HCL 10 MG/ML IJ SOLN
INTRAMUSCULAR | Status: DC | PRN
  Administered 2011-05-30 (×2): 80 ug via INTRAVENOUS

## 2011-05-30 MED ORDER — LACTATED RINGERS IV SOLN
INTRAVENOUS | Status: DC | PRN
  Administered 2011-05-30 (×2): via INTRAVENOUS

## 2011-05-30 MED ORDER — ONDANSETRON HCL 4 MG/2ML IJ SOLN
INTRAMUSCULAR | Status: DC | PRN
  Administered 2011-05-30: 4 mg via INTRAVENOUS

## 2011-05-30 MED ORDER — HYDROCHLOROTHIAZIDE 25 MG PO TABS
25.0000 mg | ORAL_TABLET | Freq: Every day | ORAL | Status: DC
  Administered 2011-05-30 – 2011-06-02 (×4): 25 mg via ORAL
  Filled 2011-05-30 (×4): qty 1

## 2011-05-30 MED ORDER — SODIUM CHLORIDE 0.9 % IR SOLN
Status: DC | PRN
  Administered 2011-05-30: 3000 mL

## 2011-05-30 MED ORDER — CLONIDINE HCL (ANALGESIA) 100 MCG/ML EP SOLN
EPIDURAL | Status: DC | PRN
  Administered 2011-05-30: 1 mL via INTRA_ARTICULAR

## 2011-05-30 MED ORDER — OXYCODONE HCL 5 MG PO TABS
5.0000 mg | ORAL_TABLET | ORAL | Status: DC | PRN
  Administered 2011-05-31 – 2011-06-02 (×8): 10 mg via ORAL
  Filled 2011-05-30 (×9): qty 2

## 2011-05-30 MED ORDER — PHENOL 1.4 % MT LIQD
1.0000 | OROMUCOSAL | Status: DC | PRN
  Filled 2011-05-30: qty 177

## 2011-05-30 MED ORDER — ACETAMINOPHEN 650 MG RE SUPP: 650.0000 mg | Freq: Four times a day (QID) | RECTAL | Status: DC | PRN

## 2011-05-30 MED ORDER — MIDAZOLAM HCL 5 MG/5ML IJ SOLN
INTRAMUSCULAR | Status: DC | PRN
  Administered 2011-05-30: 2 mg via INTRAVENOUS

## 2011-05-30 MED ORDER — CEFAZOLIN SODIUM 1-5 GM-% IV SOLN
1.0000 g | Freq: Three times a day (TID) | INTRAVENOUS | Status: AC
  Administered 2011-05-30 – 2011-05-31 (×3): 1 g via INTRAVENOUS
  Filled 2011-05-30 (×3): qty 50

## 2011-05-30 MED ORDER — METOCLOPRAMIDE HCL 5 MG/ML IJ SOLN
5.0000 mg | Freq: Three times a day (TID) | INTRAMUSCULAR | Status: DC | PRN
  Filled 2011-05-30: qty 2

## 2011-05-30 MED ORDER — NEOSTIGMINE METHYLSULFATE 1 MG/ML IJ SOLN
INTRAMUSCULAR | Status: DC | PRN
  Administered 2011-05-30: 3 mg via INTRAVENOUS

## 2011-05-30 MED ORDER — CLONIDINE HCL (ANALGESIA) 100 MCG/ML EP SOLN
150.0000 ug | EPIDURAL | Status: DC
  Filled 2011-05-30: qty 1.5

## 2011-05-30 MED ORDER — ALUM & MAG HYDROXIDE-SIMETH 200-200-20 MG/5ML PO SUSP: 30.0000 mL | ORAL | Status: DC | PRN

## 2011-05-30 MED ORDER — GLYCOPYRROLATE 0.2 MG/ML IJ SOLN
INTRAMUSCULAR | Status: DC | PRN
  Administered 2011-05-30: .4 mg via INTRAVENOUS

## 2011-05-30 MED ORDER — HYDROMORPHONE HCL PF 1 MG/ML IJ SOLN: 0.2500 mg | INTRAMUSCULAR | Status: DC | PRN

## 2011-05-30 MED ORDER — FENTANYL CITRATE 0.05 MG/ML IJ SOLN
INTRAMUSCULAR | Status: DC | PRN
  Administered 2011-05-30: 50 ug via INTRAVENOUS
  Administered 2011-05-30: 100 ug via INTRAVENOUS
  Administered 2011-05-30: 50 ug via INTRAVENOUS
  Administered 2011-05-30: 250 ug via INTRAVENOUS

## 2011-05-30 MED ORDER — METHOCARBAMOL 100 MG/ML IJ SOLN
500.0000 mg | Freq: Four times a day (QID) | INTRAVENOUS | Status: DC | PRN
  Filled 2011-05-30: qty 5

## 2011-05-30 MED ORDER — AMLODIPINE BESYLATE 10 MG PO TABS
10.0000 mg | ORAL_TABLET | Freq: Every day | ORAL | Status: DC
  Administered 2011-05-30 – 2011-06-02 (×4): 10 mg via ORAL
  Filled 2011-05-30 (×4): qty 1

## 2011-05-30 SURGICAL SUPPLY — 80 items
BANDAGE ELASTIC 4 VELCRO ST LF (GAUZE/BANDAGES/DRESSINGS) ×2 IMPLANT
BANDAGE ELASTIC 6 VELCRO ST LF (GAUZE/BANDAGES/DRESSINGS) ×4 IMPLANT
BANDAGE ESMARK 6X9 LF (GAUZE/BANDAGES/DRESSINGS) ×1 IMPLANT
BLADE SAG 18X100X1.27 (BLADE) ×1 IMPLANT
BLADE SAW SGTL 13.0X1.19X90.0M (BLADE) ×1 IMPLANT
BLADE SURG 10 STRL SS (BLADE) ×6 IMPLANT
BNDG CMPR 9X6 STRL LF SNTH (GAUZE/BANDAGES/DRESSINGS) ×1
BNDG COHESIVE 6X5 TAN STRL LF (GAUZE/BANDAGES/DRESSINGS) ×2 IMPLANT
BNDG ESMARK 6X9 LF (GAUZE/BANDAGES/DRESSINGS) ×2
BOWL SMART MIX CTS (DISPOSABLE) ×1 IMPLANT
CLOTH BEACON ORANGE TIMEOUT ST (SAFETY) ×2 IMPLANT
COVER BACK TABLE 24X17X13 BIG (DRAPES) IMPLANT
COVER SURGICAL LIGHT HANDLE (MISCELLANEOUS) ×2 IMPLANT
CUFF TOURNIQUET SINGLE 34IN LL (TOURNIQUET CUFF) ×2 IMPLANT
CUFF TOURNIQUET SINGLE 44IN (TOURNIQUET CUFF) IMPLANT
DRAPE INCISE IOBAN 66X45 STRL (DRAPES) ×1 IMPLANT
DRAPE ORTHO SPLIT 77X108 STRL (DRAPES) ×4
DRAPE PROXIMA HALF (DRAPES) ×2 IMPLANT
DRAPE SURG ORHT 6 SPLT 77X108 (DRAPES) ×2 IMPLANT
DRAPE U-SHAPE 47X51 STRL (DRAPES) ×2 IMPLANT
DRAPE X-RAY CASS 24X20 (DRAPES) IMPLANT
DRSG PAD ABDOMINAL 8X10 ST (GAUZE/BANDAGES/DRESSINGS) ×3 IMPLANT
DURAPREP 26ML APPLICATOR (WOUND CARE) ×2 IMPLANT
ELECT REM PT RETURN 9FT ADLT (ELECTROSURGICAL) ×2
ELECTRODE REM PT RTRN 9FT ADLT (ELECTROSURGICAL) ×1 IMPLANT
EVACUATOR 1/8 PVC DRAIN (DRAIN) ×1 IMPLANT
FACESHIELD LNG OPTICON STERILE (SAFETY) ×1 IMPLANT
GAUZE SPONGE 4X4 12PLY STRL LF (GAUZE/BANDAGES/DRESSINGS) ×1 IMPLANT
GAUZE XEROFORM 1X8 LF (GAUZE/BANDAGES/DRESSINGS) ×1 IMPLANT
GAUZE XEROFORM 5X9 LF (GAUZE/BANDAGES/DRESSINGS) ×2 IMPLANT
GLOVE BIO SURGEON ST LM GN SZ9 (GLOVE) ×2 IMPLANT
GLOVE BIOGEL PI IND STRL 7.5 (GLOVE) IMPLANT
GLOVE BIOGEL PI IND STRL 8 (GLOVE) ×1 IMPLANT
GLOVE BIOGEL PI INDICATOR 7.5 (GLOVE)
GLOVE BIOGEL PI INDICATOR 8 (GLOVE) ×1
GLOVE ECLIPSE 7.0 STRL STRAW (GLOVE) IMPLANT
GLOVE SURG ORTHO 8.0 STRL STRW (GLOVE) ×2 IMPLANT
GOWN PREVENTION PLUS LG XLONG (DISPOSABLE) ×1 IMPLANT
GOWN PREVENTION PLUS XLARGE (GOWN DISPOSABLE) ×2 IMPLANT
GOWN STRL NON-REIN LRG LVL3 (GOWN DISPOSABLE) ×5 IMPLANT
HANDPIECE INTERPULSE COAX TIP (DISPOSABLE) ×2
HOOD PEEL AWAY FACE SHEILD DIS (HOOD) ×6 IMPLANT
IMMBOLIZER KNEE 19 BLUE UNIV (SOFTGOODS) ×1 IMPLANT
IMMOBILIZER KNEE 20 (SOFTGOODS)
IMMOBILIZER KNEE 20 THIGH 36 (SOFTGOODS) IMPLANT
IMMOBILIZER KNEE 22 UNIV (SOFTGOODS) ×2 IMPLANT
IMMOBILIZER KNEE 24 THIGH 36 (MISCELLANEOUS) IMPLANT
IMMOBILIZER KNEE 24 UNIV (MISCELLANEOUS)
INSERT TIBIAL X3 (Orthopedic Implant) ×1 IMPLANT
KIT BASIN OR (CUSTOM PROCEDURE TRAY) ×2 IMPLANT
KIT ROOM TURNOVER OR (KITS) ×2 IMPLANT
MANIFOLD NEPTUNE II (INSTRUMENTS) ×2 IMPLANT
NDL SPNL 18GX3.5 QUINCKE PK (NEEDLE) ×1 IMPLANT
NEEDLE SPNL 18GX3.5 QUINCKE PK (NEEDLE) IMPLANT
NS IRRIG 1000ML POUR BTL (IV SOLUTION) ×2 IMPLANT
PACK TOTAL JOINT (CUSTOM PROCEDURE TRAY) ×2 IMPLANT
PAD ARMBOARD 7.5X6 YLW CONV (MISCELLANEOUS) ×4 IMPLANT
PAD CAST 4YDX4 CTTN HI CHSV (CAST SUPPLIES) ×1 IMPLANT
PADDING CAST COTTON 4X4 STRL (CAST SUPPLIES) ×2
PADDING CAST COTTON 6X4 STRL (CAST SUPPLIES) ×6 IMPLANT
PADDING WEBRIL 6 STERILE (GAUZE/BANDAGES/DRESSINGS) ×1 IMPLANT
RUBBERBAND STERILE (MISCELLANEOUS) ×1 IMPLANT
SET HNDPC FAN SPRY TIP SCT (DISPOSABLE) ×1 IMPLANT
SPONGE GAUZE 4X4 12PLY (GAUZE/BANDAGES/DRESSINGS) ×2 IMPLANT
SPONGE LAP 18X18 X RAY DECT (DISPOSABLE) ×3 IMPLANT
STAPLER VISISTAT 35W (STAPLE) ×2 IMPLANT
STRIP CLOSURE SKIN 1/2X4 (GAUZE/BANDAGES/DRESSINGS) ×1 IMPLANT
SUCTION FRAZIER TIP 10 FR DISP (SUCTIONS) ×1 IMPLANT
SUT ETHILON 3 0 PS 1 (SUTURE) ×2 IMPLANT
SUT VIC AB 0 CTB1 27 (SUTURE) ×6 IMPLANT
SUT VIC AB 1 CT1 27 (SUTURE) ×10
SUT VIC AB 1 CT1 27XBRD ANBCTR (SUTURE) ×5 IMPLANT
SUT VIC AB 2-0 CT1 27 (SUTURE) ×4
SUT VIC AB 2-0 CT1 TAPERPNT 27 (SUTURE) ×2 IMPLANT
SYR 30ML SLIP (SYRINGE) ×2 IMPLANT
TOWEL OR 17X24 6PK STRL BLUE (TOWEL DISPOSABLE) ×2 IMPLANT
TOWEL OR 17X26 10 PK STRL BLUE (TOWEL DISPOSABLE) ×4 IMPLANT
TRAY FOLEY CATH 14FR (SET/KITS/TRAYS/PACK) ×2 IMPLANT
TUBE ANAEROBIC SPECIMEN COL (MISCELLANEOUS) ×1 IMPLANT
WATER STERILE IRR 1000ML POUR (IV SOLUTION) ×4 IMPLANT

## 2011-05-30 NOTE — Preoperative (Signed)
Beta Blockers   Reason not to administer Beta Blockers:Not Applicable 

## 2011-05-30 NOTE — Interval H&P Note (Signed)
History and Physical Interval Note:  05/30/2011 7:29 AM  [name]  has presented today for surgery, with the diagnosis of Right TKA polyethylene spacer wearing with effusion  The various methods of treatment have been discussed with the patient and family. After consideration of risks, benefits and other options for treatment, the patient has consented to  Procedure(s): TOTAL KNEE REVISION as a surgical intervention .  The patients' history has been reviewed, patient examined, no change in status, stable for surgery.  I have reviewed the patients' chart and labs.  Questions were answered to the patient's satisfaction.     DEAN,GREGORY SCOTT

## 2011-05-30 NOTE — Progress Notes (Signed)
Xray done as ordered.

## 2011-05-30 NOTE — Brief Op Note (Signed)
05/30/2011  9:55 AM  PATIENT:  [name]  66 y.o. male  PRE-OPERATIVE DIAGNOSIS:  Right TKA polyethylene spacer wearing with effusion  POST-OPERATIVE DIAGNOSIS:  Right TKA polyethylene spacer wearing with effusion  PROCEDURE:  Procedure(s): partial KNEE REVISION, tibial poly exchange  SURGEON:  Surgeon(s): Corrie Mckusick Allycia Pitz  ASSISTANT: Marita Kansas PA  ANESTHESIA:   general  EBL: 50  ml    Total I/O In: 1600 [I.V.:1600] Out: 150 [Urine:150]  BLOOD ADMINISTERED: none  DRAINS: (clamped) Hemovact drain(s) in the knee with  Suction Clamped   LOCAL MEDICATIONS USED:  none  SPECIMEN:  No Specimen  COUNTS:  YES  TOURNIQUET:   Total Tourniquet Time Documented: Thigh (Right) - 47 minutes  DICTATION: .Other Dictation: Dictation Number [id number]  PLAN OF CARE: Admit to inpatient   PATIENT DISPOSITION:  PACU - hemodynamically stable

## 2011-05-30 NOTE — Transfer of Care (Signed)
Immediate Anesthesia Transfer of Care Note  Patient: [name]  Procedure(s) Performed:  TOTAL KNEE REVISION - Right total knee arthroplasty polyethylene spacer exchange  Patient Location: PACU  Anesthesia Type: General  Level of Consciousness: awake  Airway & Oxygen Therapy: Patient Spontanous Breathing and Patient connected to face mask oxygen  Post-op Assessment: Report given to PACU RN and Post -op Vital signs reviewed and stable  Post vital signs: Reviewed and stable Filed Vitals:   05/30/11 1025  BP:   Pulse:   Temp: 36.5 C  Resp:     Complications: No apparent anesthesia complications

## 2011-05-30 NOTE — Progress Notes (Signed)
Report given to shelly rn as caregiver 

## 2011-05-30 NOTE — Progress Notes (Signed)
calll in to dr. Alfonso Patten or room to ask if he wanted patient placed in cpm now or later at order did not state.  He would like pt placed in cpm at 3pm today.

## 2011-05-30 NOTE — Anesthesia Preprocedure Evaluation (Signed)
Anesthesia Evaluation  Patient identified by MRN, date of birth, ID band Patient awake    Reviewed: Allergy & Precautions, H&P , NPO status , Patient's Chart, lab work & pertinent test results  Airway Mallampati: III TM Distance: >3 FB Neck ROM: Full  Mouth opening: Limited Mouth Opening  Dental  (+) Partial Upper, Caps and Dental Advisory Given   Pulmonary  clear to auscultation  Pulmonary exam normal       Cardiovascular hypertension, Pt. on medications Regular Normal    Neuro/Psych    GI/Hepatic negative GI ROS, Neg liver ROS,   Endo/Other  Morbid obesity  Renal/GU negative Renal ROS     Musculoskeletal  (+) Arthritis -,   Abdominal (+) obese,   Peds  Hematology   Anesthesia Other Findings   Reproductive/Obstetrics                           Anesthesia Physical Anesthesia Plan  ASA: II  Anesthesia Plan: General   Post-op Pain Management:    Induction: Intravenous  Airway Management Planned: Oral ETT  Additional Equipment:   Intra-op Plan:   Post-operative Plan: Extubation in OR  Informed Consent: I have reviewed the patients History and Physical, chart, labs and discussed the procedure including the risks, benefits and alternatives for the proposed anesthesia with the patient or authorized representative who has indicated his/her understanding and acceptance.   Dental advisory given  Plan Discussed with: Surgeon and CRNA  Anesthesia Plan Comments: (Plan routine monitors, GETA.  No Femoral nerve block as per Dr. August Saucer.)        Anesthesia Quick Evaluation

## 2011-05-30 NOTE — Anesthesia Postprocedure Evaluation (Signed)
  Anesthesia Post-op Note  Patient: [name]  Procedure(s) Performed:  TOTAL KNEE REVISION - Right total knee arthroplasty polyethylene spacer exchange  Patient Location: PACU  Anesthesia Type: General  Level of Consciousness: awake, alert  and oriented  Airway and Oxygen Therapy: Patient Spontanous Breathing and Patient connected to nasal cannula oxygen  Post-op Pain: mild  Post-op Assessment: Post-op Vital signs reviewed, Patient's Cardiovascular Status Stable, Respiratory Function Stable, Patent Airway, No signs of Nausea or vomiting and Pain level controlled  Post-op Vital Signs: Reviewed and stable  Complications: No apparent anesthesia complications

## 2011-05-31 LAB — CBC
MCHC: 35.2 g/dL (ref 30.0–36.0)
Platelets: 214 10*3/uL (ref 150–400)
RDW: 11.9 % (ref 11.5–15.5)

## 2011-05-31 LAB — GLUCOSE, CAPILLARY: Glucose-Capillary: 136 mg/dL — ABNORMAL HIGH (ref 70–99)

## 2011-05-31 NOTE — Progress Notes (Signed)
D/cd foley, per dr dean. Pt tol well.

## 2011-05-31 NOTE — Progress Notes (Signed)
Occupational Therapy Evaluation Patient Details Name: [name] MRN: [id number] DOB: 1945/12/25 Today's Date: 05/31/2011 14:50-15:25  evII Problem List:  Patient Active Problem List  Diagnoses  . IMPAIRED GLUCOSE TOLERANCE  . HYPERTENSION  . EXERCISE INDUCED BRONCHOSPASM  . RENAL INSUFFICIENCY  . ERECTILE DYSFUNCTION, ORGANIC  . DYSHIDROSIS  . DEGENERATIVE JOINT DISEASE, BOTH KNEES, SEVERE  . DISC DISEASE, CERVICAL  . 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 Hypertension   . Arthritis   . Complication of anesthesia   . Neuromuscular disorder     pinched nerve in foot   Past Surgical History:  Past Surgical History  Procedure Date  . Joint replacement     bilateral knee replacenent    OT Assessment/Plan/Recommendation OT Assessment Clinical Impression Statement: Pleasant 66 yr old male admitted for elective right TKA.  Pt currently presents at a supervision/min assist level for simulated ADLs.  Will have 24 hour assistance from his wife and family.  Already has all needed DME and is not interested in purchasing any tub equipment at this time.  Feel he does not warrant any further OT needs. OT Recommendation/Assessment: Patient does not need any further OT services OT Recommendation Follow Up Recommendations: No OT follow up Equipment Recommended: None recommended by OT Individuals Consulted Consulted and Agree with Results and Recommendations: Patient OT Goals    OT Evaluation Precautions/Restrictions  Precautions Precautions: Knee Required Braces or Orthoses: Yes Knee Immobilizer: On when out of bed or walking Restrictions Weight Bearing Restrictions: No RLE Weight Bearing: Weight bearing as tolerated Prior Functioning Home Living Lives With: Spouse Receives Help From: Family Type of Home: House Home Layout: One level Home Access: Stairs to enter Entrance Stairs-Rails:  Right;Left;Can reach both Secretary/administrator of Steps: 4 Bathroom Shower/Tub: Engineer, manufacturing systems: Handicapped height Bathroom Accessibility: Yes Home Adaptive Equipment: Bedside commode/3-in-1;Walker - rolling Prior Function Level of Independence: Independent with homemaking with ambulation;Independent with basic ADLs Driving: Yes Vocation: Retired ADL ADL Eating/Feeding: Simulated;Independent Where Assessed - Eating/Feeding: Chair Grooming: Simulated;Supervision/safety Where Assessed - Grooming: Standing at sink Upper Body Bathing: Simulated;Set up Where Assessed - Upper Body Bathing: Sitting, chair Lower Body Bathing: Simulated;Supervision/safety Where Assessed - Lower Body Bathing: Sit to stand from chair Upper Body Dressing: Simulated;Set up Where Assessed - Upper Body Dressing: Sit to stand from chair Lower Body Dressing: Performed;Minimal assistance Where Assessed - Lower Body Dressing: Sit to stand from chair Toilet Transfer: Performed;Supervision/safety Toilet Transfer Method: Proofreader: Comfort height toilet Toileting - Clothing Manipulation: Performed;Supervision/safety Where Assessed - Toileting Clothing Manipulation: Sit to stand from 3-in-1 or toilet Toileting - Hygiene: Simulated;Supervision/safety Where Assessed - Toileting Hygiene: Sit to stand from 3-in-1 or toilet Tub/Shower Transfer: Not assessed (Pt plans to sponge bathe at home.) Tub/Shower Transfer Method: Not assessed Equipment Used: Rolling walker Ambulation Related to ADLs: Pt supervision for amubulation to and from 3:1 over the toilet. ADL Comments: Pt overall supervision except with LB dressing where he will have initial assistance from his family.  Discussed possible shower/tub equipment however pt chosing to sponge bathe until he can step over the edge of the tub.   Vision/Perception  Vision - History Baseline Vision: No visual deficits Patient Visual  Report: No change from baseline Cognition Cognition Arousal/Alertness: Awake/alert Overall Cognitive Status: Appears within functional limits for tasks assessed Orientation Level: Oriented X4 Sensation/Coordination Sensation Light Touch: Appears Intact Stereognosis: Not tested Hot/Cold: Not tested  Proprioception: Not tested Extremity Assessment RUE Assessment RUE Assessment: Within Functional Limits LUE Assessment LUE Assessment: Within Functional Limits Mobility  Bed Mobility Bed Mobility: Yes Sit to Supine: 4: Min assist;HOB flat Transfers Transfers: Yes Sit to Stand: 5: Supervision;With upper extremity assist;With armrests Stand to Sit: With upper extremity assist;To chair/3-in-1 Stand to Sit Details: Pt needed min insturctional cues to remember to slide the RLE out to help prevent knee pain. Exercises   End of Session OT - End of Session Equipment Utilized During Treatment: Other (comment) (Rolling Walker) Activity Tolerance: Patient tolerated treatment well Patient left: in bed;with call bell in reach General Behavior During Session: [facility] for tasks performed Cognition: [facility] for tasks performed   Markasia Carrol OTR/L 05/31/2011, 4:27 PM  Pager number [phone]]

## 2011-05-31 NOTE — Progress Notes (Signed)
Physical Therapy Evaluation Patient Details Name: [name] MRN: [id number] DOB: [birth year] Today's Date: 05/31/2011  Problem List:  Patient Active Problem List  Diagnoses  . IMPAIRED GLUCOSE TOLERANCE  . HYPERTENSION  . EXERCISE INDUCED BRONCHOSPASM  . RENAL INSUFFICIENCY  . ERECTILE DYSFUNCTION, ORGANIC  . DYSHIDROSIS  . DEGENERATIVE JOINT DISEASE, BOTH KNEES, SEVERE  . DISC DISEASE, CERVICAL  . 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 Hypertension   . Arthritis   . Complication of anesthesia   . Neuromuscular disorder     pinched nerve in foot   Past Surgical History:  Past Surgical History  Procedure Date  . Joint replacement     bilateral knee replacenent    PT Assessment/Plan/Recommendation PT Assessment Clinical Impression Statement: 66yo male s/p TKA revision right presents with decr mobility and impairments listed below; will benefit from PT to maximize independence and safety with mobility and enable safe dc home PT Recommendation/Assessment: Patient will need skilled PT in the acute care venue PT Problem List: Decreased strength;Decreased range of motion;Decreased mobility;Decreased knowledge of use of DME;Decreased knowledge of precautions;Pain PT Therapy Diagnosis : Difficulty walking;Abnormality of gait;Acute pain PT Plan PT Frequency: 7X/week PT Treatment/Interventions: DME instruction;Gait training;Stair training;Functional mobility training;Therapeutic activities;Therapeutic exercise;Patient/family education PT Recommendation Recommendations for Other Services: OT consult Follow Up Recommendations: Home health PT Equipment Recommended: None recommended by PT (will need to confirm, but I think pt already has equipment) PT Goals  Acute Rehab PT Goals PT Goal Formulation: With patient Time For Goal Achievement: 7 days Pt will go Supine/Side to Sit: with modified  independence PT Goal: Supine/Side to Sit - Progress: Other (comment) Pt will go Sit to Supine/Side: with modified independence PT Goal: Sit to Supine/Side - Progress: Other (comment) Pt will go Sit to Stand: with modified independence PT Goal: Sit to Stand - Progress: Other (comment) Pt will go Stand to Sit: with modified independence PT Goal: Stand to Sit - Progress: Other (comment) Pt will Ambulate: >150 feet;with modified independence;with rolling walker PT Goal: Ambulate - Progress: Other (comment) Pt will Go Up / Down Stairs: 3-5 stairs;with rail(s) PT Goal: Up/Down Stairs - Progress: Other (comment) Pt will Perform Home Exercise Program: Independently PT Goal: Perform Home Exercise Program - Progress: Other (comment)  PT Evaluation Precautions/Restrictions  Precautions Precautions: Knee Required Braces or Orthoses: Yes Knee Immobilizer: On when out of bed or walking Restrictions Weight Bearing Restrictions: Yes RLE Weight Bearing: Weight bearing as tolerated Prior Functioning  Home Living Lives With: Spouse Receives Help From: Family Type of Home: House Home Layout: One level Home Access: Stairs to enter Entrance Stairs-Rails: Right;Left;Can reach both Entrance Stairs-Number of Steps: 4 Home Adaptive Equipment: Bedside commode/3-in-1;Walker - rolling Prior Function Level of Independence: Independent with homemaking with ambulation Driving: Yes Vocation: Full time employment Cognition Cognition Arousal/Alertness: Awake/alert Overall Cognitive Status: Appears within functional limits for tasks assessed Orientation Level: Oriented X4 Sensation/Coordination Sensation Light Touch: Appears Intact Coordination Gross Motor Movements are Fluid and Coordinated: Yes Fine Motor Movements are Fluid and Coordinated: Not tested Extremity Assessment RUE Assessment RUE Assessment: Within Functional Limits LUE Assessment LUE Assessment: Within Functional Limits RLE  Assessment RLE Assessment: Exceptions to [facility] RLE Strength RLE Overall Strength Comments: grossly dcereased AROM and strength, liimited by pain postop; good quad activation LLE Assessment LLE Assessment: Within Functional Limits Mobility (including Balance) Bed Mobility Bed Mobility: Yes Supine to Sit: 3: Mod assist;With rails;HOB  flat Supine to Sit Details (indicate cue type and reason): cues for technique Transfers Transfers: Yes Sit to Stand: 4: Min assist;From bed;With upper extremity assist Sit to Stand Details (indicate cue type and reason): cues for safe technique Stand to Sit: 4: Min assist;With upper extremity assist;To chair/3-in-1 Stand to Sit Details: cues for safe technique Ambulation/Gait Ambulation/Gait: Yes Ambulation/Gait Assistance: 4: Min assist (second person pushing IV) Ambulation/Gait Assistance Details (indicate cue type and reason): cues for sequence and to activate Right quad in stance for stability Ambulation Distance (Feet): 30 Feet Assistive device: Rolling walker Gait Pattern: Step-to pattern Gait velocity: slow Stairs: No Wheelchair Mobility Wheelchair Mobility: No  Posture/Postural Control Posture/Postural Control: No significant limitations Balance Balance Assessed: No Exercise  Total Joint Exercises Ankle Circles/Pumps: AROM;Both;20 reps;Supine Quad Sets: AROM;Right;10 reps;Supine Heel Slides: AAROM;Right;5 reps;Supine End of Session PT - End of Session Equipment Utilized During Treatment: Gait belt;Right knee immobilizer Activity Tolerance: Patient tolerated treatment well Patient left: in chair;with call bell in reach General Behavior During Session: [facility] for tasks performed Cognition: [facility] for tasks performed  Van Clines [facility] [place], [place] [phone]  05/31/2011, 12:01 PM

## 2011-05-31 NOTE — Progress Notes (Signed)
UR COMPLETED  

## 2011-05-31 NOTE — Progress Notes (Signed)
Referred to this CSW today for ?SNF. Chart reviewed and have spoken with RNCM and Care Coordinator who indicate patient plans to d/c home with HH and DME. CSW to sign off- please contact us if SW needs arise. Samaiyah Howes, MSW, LCSWA [phone]   

## 2011-05-31 NOTE — Op Note (Signed)
NAMEZAKARI, BATHE NO.:  [id number]  MEDICAL RECORD NO.:  [id number]  LOCATION:  5020                         FACILITY:  [facility]  PHYSICIAN:  Burnard Bunting, M.D.    DATE OF BIRTH:  Oct 19, 1945  DATE OF PROCEDURE:  05/30/2011 DATE OF DISCHARGE:                              OPERATIVE REPORT   PREOPERATIVE DIAGNOSIS:  Right knee poly wear.  POSTOP DIAGNOSIS:  Right knee poly wear.  PROCEDURE:  Right knee arthrotomy with synovectomy and poly exchange, 9- 10 cm, Scorpio size 9.  SURGEON:  Burnard Bunting, MD  ASSISTANT:  Wende Neighbors, PA  ANESTHESIA:  General endotracheal.  ESTIMATED BLOOD LOSS:  75 mL.  DRAINS:  Hemovac x1.  INDICATIONS:  [name] is a patient who had knee replacement done in 2005.  He has had progressive effusion and some narrowing on x-ray. He has had no pain, no fevers and chills, but presents now for polyethylene exchange due to persistence of synovitis and effusion and narrowing of polyethylene on the plain radiograph.  OPERATIVE FINDINGS:  Upon entering the knee, there was evidence of particle wear.  There was no evidence of infection.  PROCEDURE:  The patient was brought to the operating room, where general endotracheal anesthesia induced.  Preoperative antibiotics were administered.  Right leg was prescrubbed with alcohol and Betadine, which allowed to air dry, prepped with DuraPrep solution, draped in a sterile manner.  Median parapatellar arthrotomy was made.  The patient's knee generally needs really was very constrained.  There was evidence of reactive synovitis from 3rd  particle where.  Complete synovectomy was performed.  No evidence of infection.  No evidence of loosening of either component, although there was some wear particle debris present at the bone prosthetic interface.  All this was removed.  The polyethylene was removed once adequate exposure was achieved.  The patient did have wear on the medial  greater than lateral aspect of the polyethylene.  There was no pitting or cracks in the polyethylene, just wear.  Knee joint was thoroughly irrigated.  Following complete synovectomy, new poly was placed.  The tourniquet was released. Bleeding points encountered and controlled using electrocautery. Incision was then closed over Hemovac drain using interrupted inverted #1 suture Vicryl, interrupted inverted 0 Vicryl suture, 2-0 Vicryl suture and a running 3-0 pullout Prolene.  The patient was then placed in a bulky knee dressing and knee immobilizer.  Solution of Marcaine, morphine, and clonidine injected into the knee for postop pain relief. He tolerated well without immediate complication.  Velna Hatchet Vernon's assistance was required all times during the case for retraction of important neurovascular structures, limb positioning.  Her assistance was a medical necessity.     Burnard Bunting, M.D.     GSD/MEDQ  D:  05/30/2011  T:  05/31/2011  Job:  [id number]

## 2011-05-31 NOTE — Progress Notes (Signed)
Subjective: Pt stable   Objective: Vital signs in last 24 hours: Temp:  [97.3 F (36.3 C)-99.5 F (37.5 C)] 99.5 F (37.5 C) (01/16 0628) Pulse Rate:  [70-82] 82  (01/16 0628) Resp:  [10-18] 18  (01/16 0628) BP: (122-132)/(64-82) 132/64 mmHg (01/16 0628) SpO2:  [93 %-100 %] 95 % (01/16 0628)  Intake/Output from previous day: 01/15 0701 - 01/16 0700 In: 3116.3 [P.O.:125; I.V.:2991.3] Out: 1625 [Urine:1475; Drains:100; Blood:50] Intake/Output this shift:    Exam:  Neurovascular intact Sensation intact distally Intact pulses distally  Labs:  Basename 05/31/11 0555  HGB 10.6*    Basename 05/31/11 0555  WBC 9.3  RBC 3.43*  HCT 30.1*  PLT 214   No results found for this basename: NA:2,K:2,CL:2,CO2:2,BUN:2,CREATININE:2,GLUCOSE:2,CALCIUM:2 in the last 72 hours No results found for this basename: LABPT:2,INR:2 in the last 72 hours  Assessment/Plan: Dc foley mobilize - possible dc am   [name] 05/31/2011, 7:56 AM

## 2011-06-01 ENCOUNTER — Encounter (HOSPITAL_COMMUNITY): Payer: Self-pay | Admitting: Orthopedic Surgery

## 2011-06-01 LAB — CBC
MCV: 87 fL (ref 78.0–100.0)
Platelets: 185 10*3/uL (ref 150–400)
RDW: 11.6 % (ref 11.5–15.5)
WBC: 8.2 10*3/uL (ref 4.0–10.5)

## 2011-06-01 NOTE — Progress Notes (Signed)
Physical Therapy Treatment Patient Details Name: [name] MRN: [id number] DOB: Jul 25, 1945 Today's Date: 06/01/2011  PT Assessment/Plan  PT - Assessment/Plan Comments on Treatment Session: Pt. progressing well in PT though limited to full potential by nausea.  Anticipate he can be ready for DC this afternoon  from PT standpoint if pt. pushing  for Thurs. DC., However feel nausea needs to under better control, so Friday  DC may be most practical and in pt's best interest. PT Plan: Discharge plan remains appropriate;Frequency remains appropriate PT Frequency: 7X/week Follow Up Recommendations: Home health PT Equipment Recommended: None recommended by PT PT Goals  Acute Rehab PT Goals PT Goal: Supine/Side to Sit - Progress: Progressing toward goal PT Goal: Sit to Stand - Progress: Progressing toward goal PT Goal: Stand to Sit - Progress: Progressing toward goal PT Goal: Ambulate - Progress: Progressing toward goal PT Goal: Perform Home Exercise Program - Progress: Progressing toward goal  PT Treatment Precautions/Restrictions  Precautions Precautions: Knee Precaution Booklet Issued: Yes (comment) Precaution Comments: no pillow under right knee discussed  Required Braces or Orthoses: Yes Knee Immobilizer: On when out of bed or walking Restrictions Weight Bearing Restrictions: Yes RLE Weight Bearing: Weight bearing as tolerated Mobility (including Balance) Bed Mobility Bed Mobility: Yes Supine to Sit: 4: Min assist;With rails Supine to Sit Details (indicate cue type and reason): technique cues Sit to Supine: Not Tested (comment) Transfers Transfers: Yes Sit to Stand: 5: Supervision;From bed;With upper extremity assist Sit to Stand Details (indicate cue type and reason): cues for hand placement as pt tried to pull up on RW Stand to Sit: 5: Supervision;With armrests;To chair/3-in-1 Stand to Sit Details: remembered to slide right LE forward without cues  today Ambulation/Gait Ambulation/Gait: Yes Ambulation/Gait Assistance: 4: Min assist Ambulation/Gait Assistance Details (indicate cue type and reason): initial cues for sequence Ambulation Distance (Feet): 80 Feet Assistive device: Rolling walker Gait Pattern: Step-to pattern Gait velocity: decreased Stairs: No    Exercise  Total Joint Exercises Ankle Circles/Pumps: AROM;Both;20 reps Quad Sets: AROM;10 reps;Supine;Other (comment) (;good quad activation; near zero active extension) Short Arc Quad: AAROM;Right;Supine;10 reps (could control descent to flexion by end of ex) Knee Flexion: AROM;Right;Seated (95 degrees actively) End of Session PT - End of Session Equipment Utilized During Treatment: Gait belt;Right knee immobilizer Activity Tolerance: Patient tolerated treatment well;Other (comment) (pt. experiencing nausea which limited progress; RN aware ) Nurse Communication: Weight bearing status;Mobility status for ambulation;Mobility status for transfers (pt. c/o nausea; RN in with nausea med)  Ferman Hamming 06/01/2011, 9:49 AM

## 2011-06-01 NOTE — Progress Notes (Signed)
Physical Therapy Treatment Patient Details Name: [name] MRN: [id number] DOB: 10/13/1945 Today's Date: 06/01/2011  PT Assessment/Plan  PT - Assessment/Plan Comments on Treatment Session: Pt. was not bothered by nausea this pm, and was able to begin steps negotiation.  Pt. is not DC'ing this pm.  Will see in am prior to DC. PT Plan: Discharge plan remains appropriate;Frequency remains appropriate PT Frequency: 7X/week Follow Up Recommendations: Home health PT Equipment Recommended: Defer to next venue;None recommended by PT PT Goals  Acute Rehab PT Goals PT Goal: Sit to Stand - Progress: Progressing toward goal PT Goal: Stand to Sit - Progress: Progressing toward goal PT Goal: Ambulate - Progress: Progressing toward goal PT Goal: Up/Down Stairs - Progress: Progressing toward goal  PT Treatment Precautions/Restrictions  Precautions Precautions: Knee Precaution Booklet Issued: Yes (comment) Precaution Comments: no pillow under right knee discussed  Required Braces or Orthoses: Yes Knee Immobilizer: On when out of bed or walking Restrictions Weight Bearing Restrictions: Yes RLE Weight Bearing: Weight bearing as tolerated Mobility (including Balance) Bed Mobility Bed Mobility: No Transfers Transfers: Yes Sit to Stand: 5: Supervision;With upper extremity assist;From chair/3-in-1 Sit to Stand Details (indicate cue type and reason): pt. did not try to pull up on RW; used proper technique for sit to stand Stand to Sit: 5: Supervision;To chair/3-in-1;With upper extremity assist Stand to Sit Details: cues for backing up completely to recliner for safe sit technique Ambulation/Gait Ambulation/Gait: Yes Ambulation/Gait Assistance: 4: Min assist Ambulation/Gait Assistance Details (indicate cue type and reason): pt. able to demonstrate correct gait sequence, min assist for balance and stability Ambulation Distance (Feet): 90 Feet Assistive device: Rolling walker Gait Pattern:  Step-to pattern Stairs: Yes Stairs Assistance: 4: Min assist (second person to brace RW) Stairs Assistance Details (indicate cue type and reason): vc's for technique and safe use of RW on steps Stair Management Technique: No rails;Step to pattern;Backwards;With walker Number of Stairs: 3  Height of Stairs: 6     Exercise    End of Session PT - End of Session Equipment Utilized During Treatment: Gait belt;Right knee immobilizer Activity Tolerance: Patient tolerated treatment well (denies nausea this pm) Patient left: in chair;with call bell in reach (left in chair at pt's request) General Behavior During Session: [facility] for tasks performed Cognition: [facility] for tasks performed  Ferman Hamming 06/01/2011, 2:21 PM Acute Rehabilitation Services [phone] (pager)

## 2011-06-01 NOTE — Progress Notes (Signed)
Subjective: i feel better - can get around some    Objective: Vital signs in last 24 hours: Temp:  [98.7 F (37.1 C)-99.1 F (37.3 C)] 98.8 F (37.1 C) (01/17 1300) Pulse Rate:  [77-87] 77  (01/17 1300) Resp:  [18-22] 18  (01/17 1300) BP: (117-156)/(64-81) 132/81 mmHg (01/17 1300) SpO2:  [93 %-99 %] 99 % (01/17 1300)  Intake/Output from previous day: 01/16 0701 - 01/17 0700 In: 240 [P.O.:240] Out: 1825 [Urine:1825] Intake/Output this shift:    Exam:  Neurovascular intact Sensation intact distally Intact pulses distally Dorsiflexion/Plantar flexion intact  Labs:  Basename 06/01/11 0635 05/31/11 0555  HGB 10.5* 10.6*    Basename 06/01/11 0635 05/31/11 0555  WBC 8.2 9.3  RBC 3.46* 3.43*  HCT 30.1* 30.1*  PLT 185 214   No results found for this basename: NA:2,K:2,CL:2,CO2:2,BUN:2,CREATININE:2,GLUCOSE:2,CALCIUM:2 in the last 72 hours No results found for this basename: LABPT:2,INR:2 in the last 72 hours  Assessment/Plan: Pt stable mobilizing well - will plan dc am - change dressing today   [name] 06/01/2011, 2:27 PM

## 2011-06-02 LAB — CBC
Hemoglobin: 10 g/dL — ABNORMAL LOW (ref 13.0–17.0)
MCHC: 35.5 g/dL (ref 30.0–36.0)
RBC: 3.25 MIL/uL — ABNORMAL LOW (ref 4.22–5.81)

## 2011-06-02 MED ORDER — METHOCARBAMOL 500 MG PO TABS: 500.0000 mg | ORAL_TABLET | Freq: Four times a day (QID) | ORAL | Status: AC | PRN

## 2011-06-02 MED ORDER — OXYCODONE HCL 5 MG PO TABS: 5.0000 mg | ORAL_TABLET | ORAL | Status: AC | PRN

## 2011-06-02 MED ORDER — RIVAROXABAN 10 MG PO TABS: 10.0000 mg | ORAL_TABLET | Freq: Every day | ORAL | Status: DC

## 2011-06-02 NOTE — Progress Notes (Signed)
Pt stable amb in hall - pain controlled - dc today

## 2011-06-02 NOTE — Progress Notes (Signed)
Physical Therapy Treatment Patient Details Name: [name] MRN: [id number] DOB: 1946/01/02 Today's Date: 06/02/2011  PT Assessment/Plan  PT - Assessment/Plan Comments on Treatment Session: Pt. has progressed in PT and is ready for DC home with f/u HHPT. PT Plan: All goals met and education completed, patient dischaged from PT services Follow Up Recommendations: Home health PT Equipment Recommended: None recommended by PT (has in the home already) PT Goals  Acute Rehab PT Goals PT Goal: Sit to Stand - Progress: Met PT Goal: Stand to Sit - Progress: Met PT Goal: Ambulate - Progress: Met PT Goal: Up/Down Stairs - Progress: Met PT Goal: Perform Home Exercise Program - Progress: Progressing toward goal  PT Treatment Precautions/Restrictions  Precautions Precautions: Knee Precaution Booklet Issued: Yes (comment) Precaution Comments: no pillow under right knee discussed  Required Braces or Orthoses: Yes Knee Immobilizer: On when out of bed or walking Restrictions Weight Bearing Restrictions: Yes RLE Weight Bearing: Weight bearing as tolerated Mobility (including Balance) Bed Mobility Bed Mobility: No Supine to Sit: 6: Modified independent (Device/Increase time) Transfers Transfers: Yes Sit to Stand: 6: Modified independent (Device/Increase time) Sit to Stand Details (indicate cue type and reason): no cues needed for safe and appropriate technique Stand to Sit: 6: Modified independent (Device/Increase time) Stand to Sit Details: demonstrated safe and appropriate technique Ambulation/Gait Ambulation/Gait: Yes Ambulation/Gait Assistance: 6: Modified independent (Device/Increase time) Ambulation/Gait Assistance Details (indicate cue type and reason): pt. with no overt LOB while walking.  Steady and stable with walking. Ambulation Distance (Feet): 180 Feet Assistive device: Rolling walker Gait Pattern: Step-to pattern Stairs: Yes Stairs Assistance: 4: Min assist (assist only  for bracing RW) Stairs Assistance Details (indicate cue type and reason): one cue for correct placement of RW on 2nd step going up Stair Management Technique: No rails;Step to pattern;Backwards;With walker Number of Stairs: 3  Height of Stairs: 6     Exercise  Total Joint Exercises Ankle Circles/Pumps: AROM;Both;20 reps Quad Sets: AROM;10 reps;Seated;Right Knee Flexion: AROM;Right;Seated (93 degrees) End of Session PT - End of Session Equipment Utilized During Treatment: Gait belt;Right knee immobilizer Activity Tolerance: Patient tolerated treatment well Patient left: in chair;with call bell in reach Nurse Communication: Mobility status for transfers;Mobility status for ambulation;Weight bearing status General Behavior During Session: [facility] for tasks performed Cognition: [facility] for tasks performed  Ferman Hamming 06/02/2011, 1:39 PM Acute Rehabilitation Services [phone] (pager)

## 2011-06-02 NOTE — Progress Notes (Signed)
CARE MANAGEMENT NOTE 06/02/2011  Discharge planning. Spoke with patient. Choice offered. Has used [facility], will do so now. Has rolling walker, 3in1, cane, CPM has been delivered Family support. Entered in [facility]. Patient will be going home on Xarelto 10 mg,, Called his pharmacy- [facility] on [address] [phone], spoke with Jill Alexanders, Pharmacist, they have it in stock.

## 2011-06-13 NOTE — Discharge Planning (Signed)
Physician Discharge Summary  Patient ID: [name] MRN: [id number] DOB/AGE: 67-May-1947 67 y.o.  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  They were brought to the operating room on 05/30/2011 and underwent the above named procedures.    Antibiotics given:  Anti-infectives     Start     Dose/Rate Route Frequency Ordered Stop   05/30/11 1400   ceFAZolin (ANCEF) IVPB 1 g/50 mL premix        1 g 100 mL/hr over 30 Minutes Intravenous 3 times per day 05/30/11 1300 05/31/11 0544   05/29/11 1530   ceFAZolin (ANCEF) IVPB 2 g/50 mL premix        2 g 100 mL/hr over 30 Minutes Intravenous 60 min pre-op 05/29/11 1524 05/30/11 0748        .  Recent vital signs:  Filed Vitals:   06/02/11 0927  BP: 129/73  Pulse:   Temp:   Resp:     Recent laboratory studies:  Results for orders placed during the hospital encounter of 05/30/11  CBC      Component Value Range   WBC 9.3  4.0 - 10.5 (K/uL)   RBC 3.43 (*) 4.22 - 5.81 (MIL/uL)   Hemoglobin 10.6 (*) 13.0 - 17.0 (g/dL)   HCT 40.9 (*) 81.1 - 52.0 (%)   MCV 87.8  78.0 - 100.0 (fL)   MCH 30.9  26.0 - 34.0 (pg)   MCHC 35.2  30.0 - 36.0 (g/dL)   RDW 91.4  78.2 - 95.6 (%)   Platelets 214  150 - 400 (K/uL)  GLUCOSE, CAPILLARY      Component Value Range   Glucose-Capillary 136 (*) 70 - 99 (mg/dL)  CBC      Component Value Range   WBC 8.2  4.0 - 10.5 (K/uL)   RBC 3.46 (*) 4.22 - 5.81 (MIL/uL)   Hemoglobin 10.5 (*) 13.0 - 17.0 (g/dL)   HCT 21.3 (*) 08.6 - 52.0 (%)   MCV 87.0  78.0 - 100.0  (fL)   MCH 30.3  26.0 - 34.0 (pg)   MCHC 34.9  30.0 - 36.0 (g/dL)   RDW 57.8  46.9 - 62.9 (%)   Platelets 185  150 - 400 (K/uL)  CBC      Component Value Range   WBC 8.3  4.0 - 10.5 (K/uL)   RBC 3.25 (*) 4.22 - 5.81 (MIL/uL)   Hemoglobin 10.0 (*) 13.0 - 17.0 (g/dL)   HCT 52.8 (*) 41.3 - 52.0 (%)   MCV 86.8  78.0 - 100.0 (fL)   MCH 30.8  26.0 - 34.0 (pg)   MCHC 35.5  30.0 - 36.0 (g/dL)   RDW 24.4 (*) 01.0 - 15.5 (%)   Platelets 196  150 - 400 (K/uL)    Discharge Medications:   Medication List  As of 06/13/2011  7:25 AM   STOP taking these medications         aspirin 81 MG tablet         TAKE these medications         amLODipine  10 MG tablet   Commonly known as: NORVASC   TAKE 1 TABLET BY MOUTH EVERY MORNING      fluocinonide cream 0.05 %   Commonly known as: LIDEX   APPLY TOPICALLY 2 (TWO) TIMES DAILY.      hydrochlorothiazide 25 MG tablet   Commonly known as: HYDRODIURIL   Take 1 tablet (25 mg total) by mouth daily.      rivaroxaban 10 MG Tabs tablet   Commonly known as: XARELTO   Take 1 tablet (10 mg total) by mouth daily with breakfast.            Diagnostic Studies: Dg Chest 2 View  05/25/2011  *RADIOLOGY REPORT*  Clinical Data: Preop, bilateral knee osteoarthritis, hypertension  CHEST - 2 VIEW  Comparison: 04/21/2005  Findings: Cardiomediastinal silhouette is stable.  No acute infiltrate or pleural effusion. Minimal basilar atelectasis.  Bony thorax is stable.  IMPRESSION: No active disease.  No significant change.  Original Report Authenticated By: Natasha Mead, M.D.   X-ray Knee Right Port  05/30/2011  *RADIOLOGY REPORT*  Clinical Data: Postop arthroplasty  PORTABLE RIGHT KNEE - 1-2 VIEW  Comparison: None.  Findings: There are immediate postoperative changes of right total knee arthroplasty. A drain is seen within the joint.  Expected suprapatellar joint fluid and small locules of gas.  No hardware complication or periprosthetic fracture.  IMPRESSION: Postop  changes of right knee total arthroplasty.  No acute complicating features.  Original Report Authenticated By: Britta Mccreedy, M.D.    Disposition: Home-Health Care Svc  Discharge Orders    Future Orders Please Complete By Expires   Diet - low sodium heart healthy      Call MD / Call 911      Comments:   If you experience chest pain or shortness of breath, CALL 911 and be transported to the hospital emergency room.  If you develope a fever above 101 F, pus (white drainage) or increased drainage or redness at the wound, or calf pain, call your surgeon's office.   Constipation Prevention      Comments:   Drink plenty of fluids.  Prune juice may be helpful.  You may use a stool softener, such as Colace (over the counter) 100 mg twice a day.  Use MiraLax (over the counter) for constipation as needed.   Increase activity slowly as tolerated      Weight Bearing as taught in Physical Therapy      Comments:   Use a walker or crutches as instructed.   Discharge instructions      Comments:   CPM 6 hours per day Keep incision dry Ok to discontinue knee immobilizer on Monday         Signed: DEAN,GREGORY SCOTT 06/13/2011, 7:25 AM

## 2011-08-08 ENCOUNTER — Other Ambulatory Visit: Payer: Self-pay | Admitting: Family Medicine

## 2011-08-11 ENCOUNTER — Other Ambulatory Visit: Payer: Self-pay | Admitting: Family Medicine

## 2011-08-29 ENCOUNTER — Other Ambulatory Visit (INDEPENDENT_AMBULATORY_CARE_PROVIDER_SITE_OTHER): Payer: Medicare Other

## 2011-08-29 DIAGNOSIS — I10 Essential (primary) hypertension: Secondary | ICD-10-CM

## 2011-08-29 DIAGNOSIS — Z125 Encounter for screening for malignant neoplasm of prostate: Secondary | ICD-10-CM

## 2011-08-29 DIAGNOSIS — Z0000 Encounter for general adult medical examination without abnormal findings: Secondary | ICD-10-CM

## 2011-08-29 DIAGNOSIS — N259 Disorder resulting from impaired renal tubular function, unspecified: Secondary | ICD-10-CM

## 2011-08-29 LAB — POCT URINALYSIS DIPSTICK
Bilirubin, UA: NEGATIVE
Leukocytes, UA: NEGATIVE
Nitrite, UA: NEGATIVE

## 2011-08-29 LAB — BASIC METABOLIC PANEL
GFR: 104.39 mL/min (ref >60.00)
Glucose, Bld: 120 mg/dL — ABNORMAL HIGH (ref 70–99)
Potassium: 4 mEq/L (ref 3.5–5.1)
Sodium: 141 mEq/L (ref 135–145)

## 2011-08-29 LAB — CBC WITH DIFFERENTIAL/PLATELET
Basophils Relative: 0.7 % (ref 0.0–3.0)
Eosinophils Relative: 3.9 % (ref 0.0–5.0)
HCT: 39 % (ref 39.0–52.0)
Hemoglobin: 13.2 g/dL (ref 13.0–17.0)
Lymphs Abs: 2.9 10*3/uL (ref 0.7–4.0)
Monocytes Relative: 9.6 % (ref 3.0–12.0)
Neutro Abs: 2 10*3/uL (ref 1.4–7.7)
RDW: 12.7 % (ref 11.5–14.6)

## 2011-08-29 LAB — HEPATIC FUNCTION PANEL
ALT: 13 U/L (ref 0–53)
AST: 15 U/L (ref 0–37)
Albumin: 4 g/dL (ref 3.5–5.2)
Total Protein: 7.3 g/dL (ref 6.0–8.3)

## 2011-08-29 LAB — TSH: TSH: 1.78 u[IU]/mL (ref 0.35–5.50)

## 2011-08-29 LAB — PSA: PSA: 1.5 ng/mL (ref 0.10–4.00)

## 2011-09-05 ENCOUNTER — Encounter: Payer: Medicare Other | Admitting: Family Medicine

## 2011-10-18 ENCOUNTER — Encounter: Payer: Self-pay | Admitting: Family Medicine

## 2011-10-19 ENCOUNTER — Ambulatory Visit (INDEPENDENT_AMBULATORY_CARE_PROVIDER_SITE_OTHER): Payer: Medicare Other | Admitting: Family Medicine

## 2011-10-19 ENCOUNTER — Encounter: Payer: Self-pay | Admitting: Family Medicine

## 2011-10-19 VITALS — BP 120/80 | Temp 98.1°F | Ht 68.5 in | Wt 255.0 lb

## 2011-10-19 DIAGNOSIS — I10 Essential (primary) hypertension: Secondary | ICD-10-CM

## 2011-10-19 DIAGNOSIS — N259 Disorder resulting from impaired renal tubular function, unspecified: Secondary | ICD-10-CM

## 2011-10-19 DIAGNOSIS — Z0000 Encounter for general adult medical examination without abnormal findings: Secondary | ICD-10-CM

## 2011-10-19 DIAGNOSIS — L301 Dyshidrosis [pompholyx]: Secondary | ICD-10-CM

## 2011-10-19 DIAGNOSIS — E739 Lactose intolerance, unspecified: Secondary | ICD-10-CM

## 2011-10-19 MED ORDER — FLUOCINONIDE 0.05 % EX CREA: TOPICAL_CREAM | Freq: Two times a day (BID) | CUTANEOUS | Status: DC

## 2011-10-19 MED ORDER — AMLODIPINE BESYLATE 10 MG PO TABS: 10.0000 mg | ORAL_TABLET | Freq: Every day | ORAL | Status: DC

## 2011-10-19 MED ORDER — HYDROCHLOROTHIAZIDE 25 MG PO TABS: 25.0000 mg | ORAL_TABLET | Freq: Every day | ORAL | Status: DC

## 2011-10-19 NOTE — Patient Instructions (Signed)
Continue your current medications  Return in one year sooner if any problems 

## 2011-10-19 NOTE — Progress Notes (Signed)
  Subjective:    Patient ID: [name], male    DOB: 06-26-1945, 65 y.o.   MRN: [id number]  HPI [name] is a 66 year old male who comes in today for a Medicare wellness examination because of a history of hypertension renal insufficiency and dyshidrotic eczema  He takes Norvasc 10 mg daily along with hydrochlorothiazide 25 mg daily BP 120/80  Head his right knee redone in January no complications.  He gets routine eye care, dental care, colonoscopy and GI, tetanus 2004, Pneumovax 2012, information given on shingles  Cognitive function normal he walks on a regular basis home health safety reviewed no issues identified, no guns in the house, he does have a health care power of attorney and living will   Review of Systems  Constitutional: Negative.   HENT: Negative.   Eyes: Negative.   Respiratory: Negative.   Cardiovascular: Negative.   Gastrointestinal: Negative.   Genitourinary: Negative.   Musculoskeletal: Negative.   Skin: Negative.   Neurological: Negative.   Hematological: Negative.   Psychiatric/Behavioral: Negative.        Objective:   Physical Exam  Constitutional: He is oriented to person, place, and time. He appears well-developed and well-nourished.  HENT:  Head: Normocephalic and atraumatic.  Right Ear: External ear normal.  Left Ear: External ear normal.  Nose: Nose normal.  Mouth/Throat: Oropharynx is clear and moist.  Eyes: Conjunctivae and EOM are normal. Pupils are equal, round, and reactive to light.  Neck: Normal range of motion. Neck supple. No JVD present. No tracheal deviation present. No thyromegaly present.  Cardiovascular: Normal rate, regular rhythm, normal heart sounds and intact distal pulses.  Exam reveals no gallop and no friction rub.   No murmur heard. Pulmonary/Chest: Effort normal and breath sounds normal. No stridor. No respiratory distress. He has no wheezes. He has no rales. He exhibits no tenderness.  Abdominal: Soft. Bowel sounds  are normal. He exhibits no distension and no mass. There is no tenderness. There is no rebound and no guarding.  Genitourinary: Rectum normal, prostate normal and penis normal. Guaiac negative stool. No penile tenderness.  Musculoskeletal: Normal range of motion. He exhibits no edema and no tenderness.  Lymphadenopathy:    He has no cervical adenopathy.  Neurological: He is alert and oriented to person, place, and time. He has normal reflexes. No cranial nerve deficit. He exhibits normal muscle tone.  Skin: Skin is warm and dry. No rash noted. No erythema. No pallor.       Skin exam normal scars right and left knee from previous knee replacements  Psychiatric: He has a normal mood and affect. His behavior is normal. Judgment and thought content normal.          Assessment & Plan:  Healthy male  Hypertension continue current meds  Status post total knee replacements right and left

## 2011-11-12 ENCOUNTER — Other Ambulatory Visit: Payer: Self-pay | Admitting: Family Medicine

## 2012-03-15 ENCOUNTER — Other Ambulatory Visit: Payer: Self-pay | Admitting: Family Medicine

## 2012-10-17 ENCOUNTER — Other Ambulatory Visit (INDEPENDENT_AMBULATORY_CARE_PROVIDER_SITE_OTHER): Payer: Medicare Other

## 2012-10-17 DIAGNOSIS — Z0000 Encounter for general adult medical examination without abnormal findings: Secondary | ICD-10-CM

## 2012-10-17 DIAGNOSIS — N259 Disorder resulting from impaired renal tubular function, unspecified: Secondary | ICD-10-CM

## 2012-10-17 DIAGNOSIS — I10 Essential (primary) hypertension: Secondary | ICD-10-CM

## 2012-10-17 LAB — HEPATIC FUNCTION PANEL
Albumin: 3.8 g/dL (ref 3.5–5.2)
Alkaline Phosphatase: 97 U/L (ref 39–117)
Bilirubin, Direct: 0.1 mg/dL (ref 0.0–0.3)

## 2012-10-17 LAB — BASIC METABOLIC PANEL
BUN: 15 mg/dL (ref 6–23)
Creatinine, Ser: 1 mg/dL (ref 0.4–1.5)
GFR: 92.47 mL/min (ref >60.00)

## 2012-10-17 LAB — CBC WITH DIFFERENTIAL/PLATELET
Basophils Relative: 1.2 % (ref 0.0–3.0)
Eosinophils Absolute: 0.1 10*3/uL (ref 0.0–0.7)
Hemoglobin: 13.7 g/dL (ref 13.0–17.0)
MCHC: 33.8 g/dL (ref 30.0–36.0)
MCV: 93.3 fl (ref 78.0–100.0)
Monocytes Absolute: 0.5 10*3/uL (ref 0.1–1.0)
Neutro Abs: 1.8 10*3/uL (ref 1.4–7.7)
Neutrophils Relative %: 37.2 % — ABNORMAL LOW (ref 43.0–77.0)
RBC: 4.34 Mil/uL (ref 4.22–5.81)

## 2012-10-17 LAB — POCT URINALYSIS DIPSTICK
Glucose, UA: NEGATIVE
Spec Grav, UA: 1.025

## 2012-10-17 LAB — PSA: PSA: 1.85 ng/mL (ref 0.10–4.00)

## 2012-10-17 LAB — TSH: TSH: 1.21 u[IU]/mL (ref 0.35–5.50)

## 2012-10-17 LAB — LIPID PANEL: Cholesterol: 183 mg/dL (ref 0–200)

## 2012-10-24 ENCOUNTER — Encounter: Payer: Self-pay | Admitting: Family Medicine

## 2012-10-24 ENCOUNTER — Ambulatory Visit (INDEPENDENT_AMBULATORY_CARE_PROVIDER_SITE_OTHER): Payer: Medicare Other | Admitting: Family Medicine

## 2012-10-24 VITALS — BP 110/80 | Temp 98.3°F | Ht 68.5 in | Wt 254.0 lb

## 2012-10-24 DIAGNOSIS — E739 Lactose intolerance, unspecified: Secondary | ICD-10-CM

## 2012-10-24 DIAGNOSIS — N529 Male erectile dysfunction, unspecified: Secondary | ICD-10-CM

## 2012-10-24 DIAGNOSIS — I10 Essential (primary) hypertension: Secondary | ICD-10-CM

## 2012-10-24 DIAGNOSIS — N259 Disorder resulting from impaired renal tubular function, unspecified: Secondary | ICD-10-CM

## 2012-10-24 DIAGNOSIS — Z23 Encounter for immunization: Secondary | ICD-10-CM

## 2012-10-24 DIAGNOSIS — L301 Dyshidrosis [pompholyx]: Secondary | ICD-10-CM

## 2012-10-24 MED ORDER — AMLODIPINE BESYLATE 10 MG PO TABS: ORAL_TABLET | ORAL | Status: DC

## 2012-10-24 MED ORDER — HYDROCHLOROTHIAZIDE 25 MG PO TABS: ORAL_TABLET | ORAL | Status: DC

## 2012-10-24 MED ORDER — FLUOCINONIDE 0.05 % EX CREA: TOPICAL_CREAM | Freq: Two times a day (BID) | CUTANEOUS | Status: DC

## 2012-10-24 NOTE — Progress Notes (Signed)
  Subjective:    Patient ID: [name], male    DOB: 31-Oct-1945, 67 y.o.   MRN: [id number]  HPI [name] is a 67 year old married male nonsmoker who comes in today for a Medicare wellness exam because of a history of hypertension  He takes 10 mg of Norvasc along with hydrochlorothiazide 25 mg daily BP 110/80 no side effects to medication  He gets routine eye care, dental care, colonoscopy, tetanus booster today  Cognitive function normal he walks on a regular basis home health safety reviewed no issues identified, no guns in the house, he does have a health care power of attorney and living well  He's had both knees replaced and a redo on his right knee,,,,,,,,, they replaced the spacer   Review of Systems  Constitutional: Negative.   HENT: Negative.   Eyes: Negative.   Respiratory: Negative.   Cardiovascular: Negative.   Gastrointestinal: Negative.   Genitourinary: Negative.   Musculoskeletal: Negative.   Skin: Negative.   Neurological: Negative.   Psychiatric/Behavioral: Negative.        Objective:   Physical Exam  Constitutional: He is oriented to person, place, and time. He appears well-developed and well-nourished.  HENT:  Head: Normocephalic and atraumatic.  Right Ear: External ear normal.  Left Ear: External ear normal.  Nose: Nose normal.  Mouth/Throat: Oropharynx is clear and moist.  Eyes: Conjunctivae and EOM are normal. Pupils are equal, round, and reactive to light. Right eye exhibits no discharge. Left eye exhibits no discharge. No scleral icterus.  Early bilateral cataracts  Neck: Normal range of motion. Neck supple. No JVD present. No tracheal deviation present. No thyromegaly present.  Cardiovascular: Normal rate, regular rhythm, normal heart sounds and intact distal pulses.  Exam reveals no gallop and no friction rub.   No murmur heard. Pulmonary/Chest: Effort normal and breath sounds normal. No stridor. No respiratory distress. He has no wheezes. He  has no rales. He exhibits no tenderness.  Abdominal: Soft. Bowel sounds are normal. He exhibits no distension and no mass. There is no tenderness. There is no rebound and no guarding.  Genitourinary: Rectum normal, prostate normal and penis normal. Guaiac negative stool. No penile tenderness.  Musculoskeletal: Normal range of motion. He exhibits no edema and no tenderness.  Scars right and left knees from previous TKR  Lymphadenopathy:    He has no cervical adenopathy.  Neurological: He is alert and oriented to person, place, and time. He has normal reflexes. No cranial nerve deficit. He exhibits normal muscle tone.  Skin: Skin is warm and dry. No rash noted. No erythema. No pallor.  Psychiatric: He has a normal mood and affect. His behavior is normal. Judgment and thought content normal.          Assessment & Plan:  Healthy male  Hypertension at goal continue current therapy  History of bilateral TKR,,,,,,,, with redo right knee replaced a spacer,,,,,,,,  Early bilateral cataracts

## 2012-10-24 NOTE — Patient Instructions (Signed)
Continue your current medications Norvasc and hydrochlorothiazide,,,,,,,,,,, one of each daily in the morning for good blood pressure control  Lidex cream when necessary for rashes  Continue to walk 30 minutes daily  Followup in 1 year sooner if any problems

## 2012-10-30 ENCOUNTER — Encounter: Payer: Medicare Other | Admitting: Family Medicine

## 2013-08-07 ENCOUNTER — Encounter: Payer: Self-pay | Admitting: Family Medicine

## 2013-08-07 ENCOUNTER — Ambulatory Visit (INDEPENDENT_AMBULATORY_CARE_PROVIDER_SITE_OTHER): Payer: Medicare Other | Admitting: Family Medicine

## 2013-08-07 VITALS — BP 110/68 | Temp 100.0°F | Wt 258.0 lb

## 2013-08-07 DIAGNOSIS — J181 Lobar pneumonia, unspecified organism: Principal | ICD-10-CM

## 2013-08-07 DIAGNOSIS — J189 Pneumonia, unspecified organism: Secondary | ICD-10-CM

## 2013-08-07 MED ORDER — HYDROCODONE-HOMATROPINE 5-1.5 MG/5ML PO SYRP: 5.0000 mL | ORAL_SOLUTION | Freq: Three times a day (TID) | ORAL | Status: DC | PRN

## 2013-08-07 MED ORDER — MINOCYCLINE HCL 100 MG PO CAPS: 100.0000 mg | ORAL_CAPSULE | Freq: Two times a day (BID) | ORAL | Status: DC

## 2013-08-07 NOTE — Progress Notes (Signed)
Pre visit review using our clinic review tool, if applicable. No additional management support is needed unless otherwise documented below in the visit note. 

## 2013-08-07 NOTE — Progress Notes (Signed)
   Subjective:    Patient ID: [name], male    DOB: 1946-05-03, 68 y.o.   MRN: [id number]  HPI [name] is a 68 year old married male nonsmoker who comes in today with a cough for week  Michela Pitcher his daughter had a cold and then up in the hospital for 4 days.  He's had no fever earache nausea vomiting diarrhea sore throat. He has some slightly discolored sputum.   Review of Systems    review of systems negative Objective:   Physical Exam  Well-developed well-nourished male no acute distress vital signs stable is afebrile HEENT negative neck was supple no adenopathy lungs are clear except for crackles left base 6 consistent with a left lower lobe pneumonia      Assessment & Plan:  Left lower no pneumonia,,,,,,,,,,, treat with antibiotics liquids cough syrup etc.,

## 2013-08-07 NOTE — Patient Instructions (Signed)
Rest at home  Drink lots of water  Hydromet...Marland KitchenMarland KitchenMarland Kitchen 1/2-1 teaspoon 3 times daily. For cough  Doxycycline........ one twice daily x10 days  Return when necessary

## 2013-10-21 ENCOUNTER — Other Ambulatory Visit (INDEPENDENT_AMBULATORY_CARE_PROVIDER_SITE_OTHER): Payer: Medicare Other

## 2013-10-21 DIAGNOSIS — I10 Essential (primary) hypertension: Secondary | ICD-10-CM

## 2013-10-21 DIAGNOSIS — Z0000 Encounter for general adult medical examination without abnormal findings: Secondary | ICD-10-CM

## 2013-10-21 LAB — POCT URINALYSIS DIPSTICK
Bilirubin, UA: NEGATIVE
Glucose, UA: NEGATIVE
KETONES UA: NEGATIVE
Leukocytes, UA: NEGATIVE
Nitrite, UA: NEGATIVE
PH UA: 7
SPEC GRAV UA: 1.02
Urobilinogen, UA: 2

## 2013-10-21 LAB — BASIC METABOLIC PANEL
BUN: 13 mg/dL (ref 6–23)
CHLORIDE: 103 meq/L (ref 96–112)
CO2: 27 meq/L (ref 19–32)
Calcium: 9.7 mg/dL (ref 8.4–10.5)
Creatinine, Ser: 0.9 mg/dL (ref 0.4–1.5)
GFR: 106.36 mL/min (ref >60.00)
GLUCOSE: 115 mg/dL — AB (ref 70–99)
POTASSIUM: 3.9 meq/L (ref 3.5–5.1)
SODIUM: 139 meq/L (ref 135–145)

## 2013-10-21 LAB — HEPATIC FUNCTION PANEL
ALBUMIN: 4 g/dL (ref 3.5–5.2)
ALT: 14 U/L (ref 0–53)
AST: 21 U/L (ref 0–37)
Alkaline Phosphatase: 89 U/L (ref 39–117)
Bilirubin, Direct: 0.2 mg/dL (ref 0.0–0.3)
Total Bilirubin: 0.9 mg/dL (ref 0.2–1.2)
Total Protein: 7.5 g/dL (ref 6.0–8.3)

## 2013-10-21 LAB — LIPID PANEL
CHOL/HDL RATIO: 4
Cholesterol: 192 mg/dL (ref 0–200)
HDL: 43.5 mg/dL (ref >39.00)
LDL CALC: 142 mg/dL — AB (ref 0–99)
NONHDL: 148.5
Triglycerides: 33 mg/dL (ref 0.0–149.0)
VLDL: 6.6 mg/dL (ref 0.0–40.0)

## 2013-10-21 LAB — CBC WITH DIFFERENTIAL/PLATELET
BASOS ABS: 0 10*3/uL (ref 0.0–0.1)
Basophils Relative: 0.4 % (ref 0.0–3.0)
Eosinophils Absolute: 0.3 10*3/uL (ref 0.0–0.7)
Eosinophils Relative: 6 % — ABNORMAL HIGH (ref 0.0–5.0)
HEMATOCRIT: 41 % (ref 39.0–52.0)
HEMOGLOBIN: 13.9 g/dL (ref 13.0–17.0)
LYMPHS ABS: 2.6 10*3/uL (ref 0.7–4.0)
Lymphocytes Relative: 47 % — ABNORMAL HIGH (ref 12.0–46.0)
MCHC: 33.9 g/dL (ref 30.0–36.0)
MCV: 91.3 fl (ref 78.0–100.0)
MONO ABS: 0.5 10*3/uL (ref 0.1–1.0)
MONOS PCT: 8.8 % (ref 3.0–12.0)
NEUTROS ABS: 2.1 10*3/uL (ref 1.4–7.7)
Neutrophils Relative %: 37.8 % — ABNORMAL LOW (ref 43.0–77.0)
PLATELETS: 247 10*3/uL (ref 150.0–400.0)
RBC: 4.49 Mil/uL (ref 4.22–5.81)
RDW: 12.6 % (ref 11.5–15.5)
WBC: 5.5 10*3/uL (ref 4.0–10.5)

## 2013-10-21 LAB — TSH: TSH: 1 u[IU]/mL (ref 0.35–4.50)

## 2013-10-21 LAB — PSA: PSA: 2 ng/mL (ref 0.10–4.00)

## 2013-10-28 ENCOUNTER — Encounter: Payer: Self-pay | Admitting: Family Medicine

## 2013-10-28 ENCOUNTER — Ambulatory Visit (INDEPENDENT_AMBULATORY_CARE_PROVIDER_SITE_OTHER): Payer: Medicare Other | Admitting: Family Medicine

## 2013-10-28 VITALS — BP 130/80 | Temp 98.5°F | Ht 68.25 in | Wt 255.0 lb

## 2013-10-28 DIAGNOSIS — I10 Essential (primary) hypertension: Secondary | ICD-10-CM

## 2013-10-28 DIAGNOSIS — E739 Lactose intolerance, unspecified: Secondary | ICD-10-CM

## 2013-10-28 DIAGNOSIS — L301 Dyshidrosis [pompholyx]: Secondary | ICD-10-CM

## 2013-10-28 DIAGNOSIS — N529 Male erectile dysfunction, unspecified: Secondary | ICD-10-CM

## 2013-10-28 DIAGNOSIS — N259 Disorder resulting from impaired renal tubular function, unspecified: Secondary | ICD-10-CM

## 2013-10-28 DIAGNOSIS — Z23 Encounter for immunization: Secondary | ICD-10-CM

## 2013-10-28 MED ORDER — FLUOCINONIDE 0.05 % EX CREA: TOPICAL_CREAM | Freq: Two times a day (BID) | CUTANEOUS | Status: DC

## 2013-10-28 MED ORDER — AMLODIPINE BESYLATE 10 MG PO TABS: ORAL_TABLET | ORAL | Status: DC

## 2013-10-28 MED ORDER — HYDROCHLOROTHIAZIDE 25 MG PO TABS: ORAL_TABLET | ORAL | Status: DC

## 2013-10-28 NOTE — Patient Instructions (Signed)
Continue to walk 30 minutes daily  Continue your current medication daily  Followup in 1 year sooner if any problems  Dr. Hillis Range ophthalmologist.......... cataract surgeon when necessary

## 2013-10-28 NOTE — Progress Notes (Signed)
Pre visit review using our clinic review tool, if applicable. No additional management support is needed unless otherwise documented below in the visit note. 

## 2013-10-28 NOTE — Progress Notes (Signed)
   Subjective:    Patient ID: [name], male    DOB: 12-15-1945, 68 y.o.   MRN: [id number]  HPI [name] is a 68 year old male nonsmoker who comes in today for evaluation of hypertension and eczema  He takes Norvasc 10 mg hydrochlorothiazide 25 mg daily BP 130/80  He has eczema and uses Lidex when necessary  He gets routine eye care, dental care, colonoscopy and GI, vaccinations updated by [name]   Review of Systems  Constitutional: Negative.   HENT: Negative.   Eyes: Negative.   Respiratory: Negative.   Cardiovascular: Negative.   Gastrointestinal: Negative.   Genitourinary: Negative.   Musculoskeletal: Negative.   Skin: Negative.   Neurological: Negative.   Psychiatric/Behavioral: Negative.        Objective:   Physical Exam  Nursing note and vitals reviewed. Constitutional: He is oriented to person, place, and time. He appears well-developed and well-nourished.  HENT:  Head: Normocephalic and atraumatic.  Right Ear: External ear normal.  Left Ear: External ear normal.  Nose: Nose normal.  Mouth/Throat: Oropharynx is clear and moist.  Eyes: Conjunctivae and EOM are normal. Pupils are equal, round, and reactive to light.  Neck: Normal range of motion. Neck supple. No JVD present. No tracheal deviation present. No thyromegaly present.  Cardiovascular: Normal rate, regular rhythm, normal heart sounds and intact distal pulses.  Exam reveals no gallop and no friction rub.   No murmur heard. No carotid nor aortic bruits peripheral pulses 1+ and symmetrical  Pulmonary/Chest: Effort normal and breath sounds normal. No stridor. No respiratory distress. He has no wheezes. He has no rales. He exhibits no tenderness.  Abdominal: Soft. Bowel sounds are normal. He exhibits no distension and no mass. There is no tenderness. There is no rebound and no guarding.  Genitourinary: Rectum normal and penis normal. Guaiac negative stool. No penile tenderness.  1+ symmetrical nonnodular  BPH  Musculoskeletal: Normal range of motion. He exhibits no edema and no tenderness.  Scar right and left anterior knees from previous knee replacements  Lymphadenopathy:    He has no cervical adenopathy.  Neurological: He is alert and oriented to person, place, and time. He has normal reflexes. No cranial nerve deficit. He exhibits normal muscle tone.  Skin: Skin is warm and dry. No rash noted. No erythema. No pallor.  Psychiatric: He has a normal mood and affect. His behavior is normal. Judgment and thought content normal.          Assessment & Plan:  Healthy male  Hypertension at goal continue current therapy  And [facility] August checked sugar and A1 C.  Status post right left total knee replacement continue exercise and weight loss program  Renal insufficiency monitor labs

## 2013-10-29 ENCOUNTER — Telehealth: Payer: Self-pay | Admitting: Family Medicine

## 2013-10-29 NOTE — Telephone Encounter (Signed)
Relevant patient education assigned to patient using Emmi. ° °

## 2013-11-01 ENCOUNTER — Other Ambulatory Visit: Payer: Self-pay | Admitting: Family Medicine

## 2014-02-26 ENCOUNTER — Ambulatory Visit (INDEPENDENT_AMBULATORY_CARE_PROVIDER_SITE_OTHER): Payer: Medicare Other

## 2014-02-26 DIAGNOSIS — Z23 Encounter for immunization: Secondary | ICD-10-CM

## 2014-03-03 ENCOUNTER — Encounter: Payer: Self-pay | Admitting: Family Medicine

## 2014-03-03 ENCOUNTER — Ambulatory Visit (INDEPENDENT_AMBULATORY_CARE_PROVIDER_SITE_OTHER): Payer: Medicare Other | Admitting: Family Medicine

## 2014-03-03 VITALS — BP 130/80 | Temp 97.5°F | Wt 250.0 lb

## 2014-03-03 DIAGNOSIS — J069 Acute upper respiratory infection, unspecified: Secondary | ICD-10-CM

## 2014-03-03 MED ORDER — HYDROCODONE-HOMATROPINE 5-1.5 MG/5ML PO SYRP: 5.0000 mL | ORAL_SOLUTION | Freq: Three times a day (TID) | ORAL | Status: DC | PRN

## 2014-03-03 NOTE — Progress Notes (Signed)
Pre visit review using our clinic review tool, if applicable. No additional management support is needed unless otherwise documented below in the visit note. 

## 2014-03-03 NOTE — Progress Notes (Signed)
   Subjective:    Patient ID: [name], male    DOB: 1946-03-06, 68 y.o.   MRN: [id number]  HPI [name] is a 68 year old male married nonsmoker who comes in today for evaluation of a cough for 5 days  His granddaughter was sick a week ago  He's had no fever earache sore throat nausea vomiting diarrhea or urinary tract symptoms. No history of asthma or lung disease he's a nonsmoker   Review of Systems Review of systems otherwise negative    Objective:   Physical Exam  Well-developed well-nourished male no acute distress vital signs stable he is afebrile HEENT negative neck was supple no adenopathy lungs are clear      Assessment & Plan:  Viral syndrome plan treat symptomatically with liquids and cough syrup antibiotics not indicated

## 2014-03-03 NOTE — Patient Instructions (Signed)
Drink lots of liquids  Hydromet...............Marland Kitchen 1/2-1 teaspoon 3 times daily. For cough  Return when necessary

## 2014-06-11 ENCOUNTER — Telehealth: Payer: Self-pay | Admitting: Family Medicine

## 2014-06-11 ENCOUNTER — Ambulatory Visit (INDEPENDENT_AMBULATORY_CARE_PROVIDER_SITE_OTHER): Payer: Medicare Other | Admitting: Family Medicine

## 2014-06-11 ENCOUNTER — Encounter: Payer: Self-pay | Admitting: Family Medicine

## 2014-06-11 VITALS — BP 120/78 | Temp 97.3°F | Wt 250.0 lb

## 2014-06-11 DIAGNOSIS — R300 Dysuria: Secondary | ICD-10-CM

## 2014-06-11 DIAGNOSIS — N410 Acute prostatitis: Secondary | ICD-10-CM

## 2014-06-11 LAB — POCT URINALYSIS DIPSTICK
BILIRUBIN UA: NEGATIVE
Glucose, UA: NEGATIVE
Ketones, UA: NEGATIVE
Nitrite, UA: POSITIVE
Protein, UA: NEGATIVE
Spec Grav, UA: 1.01
UROBILINOGEN UA: 0.2
pH, UA: 6

## 2014-06-11 MED ORDER — DOXYCYCLINE HYCLATE 100 MG PO TABS: 100.0000 mg | ORAL_TABLET | Freq: Two times a day (BID) | ORAL | Status: DC

## 2014-06-11 NOTE — Progress Notes (Signed)
   Subjective:    Patient ID: COLBI STAUBS, male    DOB: Mar 02, 1946, 69 y.o.   MRN: [id number]  HPI [name] is a 69 year old male who comes in today for evaluation of dysuria 1 day  He's had a history of mild BPH but never had any prostatitis in the past. No fever chills or back pain   Review of Systems Review of systems otherwise negative urinalysis shows white cells bacteria positive nitrate    Objective:   Physical Exam  Well-developed well-nourished male no acute distress vital signs stable he is afebrile general exam normal      Assessment & Plan:  Prostatitis............. doxycycline twice a day for 3 weeks.

## 2014-06-11 NOTE — Progress Notes (Signed)
Pre visit review using our clinic review tool, if applicable. No additional management support is needed unless otherwise documented below in the visit note. 

## 2014-06-11 NOTE — Telephone Encounter (Signed)
Patient is having pain with urination.  Can we work him in after 1??  He has to take his to wife to an appointment at 10:30.

## 2014-06-11 NOTE — Patient Instructions (Signed)
Doxycycline,,,,,,,,,, one twice daily until the bottle is empty,,,,,,,,,, refills 1

## 2014-06-11 NOTE — Telephone Encounter (Signed)
12:15 pm per rachel

## 2014-08-17 ENCOUNTER — Encounter: Payer: Self-pay | Admitting: Family Medicine

## 2014-08-17 ENCOUNTER — Ambulatory Visit (INDEPENDENT_AMBULATORY_CARE_PROVIDER_SITE_OTHER): Payer: Medicare Other | Admitting: Family Medicine

## 2014-08-17 VITALS — BP 124/68 | HR 75 | Temp 98.1°F | Wt 257.0 lb

## 2014-08-17 DIAGNOSIS — J209 Acute bronchitis, unspecified: Secondary | ICD-10-CM

## 2014-08-17 MED ORDER — HYDROCODONE-HOMATROPINE 5-1.5 MG/5ML PO SYRP: 5.0000 mL | ORAL_SOLUTION | Freq: Four times a day (QID) | ORAL | Status: AC | PRN

## 2014-08-17 NOTE — Progress Notes (Signed)
   Subjective:    Patient ID: [name], male    DOB: June 28, 1945, 69 y.o.   MRN: [id number]  HPI Patient seen for acute visit regarding cough. Former smoker seen with five-day history of mostly dry cough. Denies any nasal congestion. No fevers or chills. Cough was severe last night. He took some cough drops with minimal relief. No history of any known asthma or wheezing. No alleviating factors.  Past Medical History  Diagnosis Date  . Hypertension   . Arthritis   . Complication of anesthesia   . Neuromuscular disorder     pinched nerve in foot   Past Surgical History  Procedure Laterality Date  . Joint replacement      bilateral knee replacenent  . Total knee revision  05/30/2011    Procedure: TOTAL KNEE REVISION;  Surgeon: Meredith Pel, MD;  Location: [place];  Service: Orthopedics;  Laterality: Right;  Right total knee arthroplasty polyethylene spacer exchange    reports that he has quit smoking. He does not have any smokeless tobacco history on file. He reports that he drinks alcohol. He reports that he does not use illicit drugs. family history includes Hypertension in his other. No Known Allergies    Review of Systems  Constitutional: Negative for fever and chills.  HENT: Negative for congestion.   Respiratory: Positive for cough. Negative for shortness of breath.   Cardiovascular: Negative for chest pain.       Objective:   Physical Exam  Constitutional: He appears well-developed and well-nourished. No distress.  HENT:  Head: Normocephalic and atraumatic.  Right Ear: External ear normal.  Left Ear: External ear normal.  Mouth/Throat: Oropharynx is clear and moist.  Neck: Neck supple.  Cardiovascular: Normal rate and regular rhythm.   Pulmonary/Chest: Effort normal and breath sounds normal. No respiratory distress. He has no wheezes. He has no rales.  Lymphadenopathy:    He has no cervical adenopathy.          Assessment & Plan:  Cough. Suspect  acute viral bronchitis. Hycodan cough syrup 1 teaspoon every 6 hours as needed. Follow-up for fever or worsening symptoms

## 2014-08-17 NOTE — Progress Notes (Signed)
Pre visit review using our clinic review tool, if applicable. No additional management support is needed unless otherwise documented below in the visit note. 

## 2014-10-21 ENCOUNTER — Other Ambulatory Visit: Payer: Self-pay | Admitting: Family Medicine

## 2014-11-19 DIAGNOSIS — H40023 Open angle with borderline findings, high risk, bilateral: Secondary | ICD-10-CM | POA: Diagnosis not present

## 2014-11-19 DIAGNOSIS — H25013 Cortical age-related cataract, bilateral: Secondary | ICD-10-CM | POA: Diagnosis not present

## 2014-11-25 ENCOUNTER — Other Ambulatory Visit (INDEPENDENT_AMBULATORY_CARE_PROVIDER_SITE_OTHER): Payer: Medicare Other

## 2014-11-25 DIAGNOSIS — N400 Enlarged prostate without lower urinary tract symptoms: Secondary | ICD-10-CM | POA: Diagnosis not present

## 2014-11-25 DIAGNOSIS — I10 Essential (primary) hypertension: Secondary | ICD-10-CM

## 2014-11-25 DIAGNOSIS — Z0000 Encounter for general adult medical examination without abnormal findings: Secondary | ICD-10-CM | POA: Diagnosis not present

## 2014-11-25 LAB — LIPID PANEL
CHOL/HDL RATIO: 4
Cholesterol: 173 mg/dL (ref 0–200)
HDL: 39.3 mg/dL (ref >39.00)
LDL Cholesterol: 113 mg/dL — ABNORMAL HIGH (ref 0–99)
NonHDL: 133.7
Triglycerides: 106 mg/dL (ref 0.0–149.0)
VLDL: 21.2 mg/dL (ref 0.0–40.0)

## 2014-11-25 LAB — HEPATIC FUNCTION PANEL
ALT: 14 U/L (ref 0–53)
AST: 14 U/L (ref 0–37)
Albumin: 4 g/dL (ref 3.5–5.2)
Alkaline Phosphatase: 99 U/L (ref 39–117)
BILIRUBIN DIRECT: 0.1 mg/dL (ref 0.0–0.3)
BILIRUBIN TOTAL: 0.7 mg/dL (ref 0.2–1.2)
TOTAL PROTEIN: 7.4 g/dL (ref 6.0–8.3)

## 2014-11-25 LAB — PSA: PSA: 1.82 ng/mL (ref 0.10–4.00)

## 2014-11-25 LAB — CBC WITH DIFFERENTIAL/PLATELET
Basophils Absolute: 0 10*3/uL (ref 0.0–0.1)
Basophils Relative: 0.8 % (ref 0.0–3.0)
Eosinophils Absolute: 0.3 10*3/uL (ref 0.0–0.7)
Eosinophils Relative: 4.7 % (ref 0.0–5.0)
HCT: 40.1 % (ref 39.0–52.0)
Hemoglobin: 13.7 g/dL (ref 13.0–17.0)
Lymphocytes Relative: 45.9 % (ref 12.0–46.0)
Lymphs Abs: 2.6 10*3/uL (ref 0.7–4.0)
MCHC: 34.2 g/dL (ref 30.0–36.0)
MCV: 90.9 fl (ref 78.0–100.0)
Monocytes Absolute: 0.5 10*3/uL (ref 0.1–1.0)
Monocytes Relative: 9.1 % (ref 3.0–12.0)
Neutro Abs: 2.2 10*3/uL (ref 1.4–7.7)
Neutrophils Relative %: 39.5 % — ABNORMAL LOW (ref 43.0–77.0)
Platelets: 276 10*3/uL (ref 150.0–400.0)
RBC: 4.41 Mil/uL (ref 4.22–5.81)
RDW: 12.4 % (ref 11.5–15.5)
WBC: 5.6 10*3/uL (ref 4.0–10.5)

## 2014-11-25 LAB — BASIC METABOLIC PANEL
BUN: 15 mg/dL (ref 6–23)
CALCIUM: 9.5 mg/dL (ref 8.4–10.5)
CO2: 29 mEq/L (ref 19–32)
CREATININE: 0.88 mg/dL (ref 0.40–1.50)
Chloride: 103 mEq/L (ref 96–112)
GFR: 110.2 mL/min (ref >60.00)
GLUCOSE: 114 mg/dL — AB (ref 70–99)
POTASSIUM: 3.7 meq/L (ref 3.5–5.1)
Sodium: 141 mEq/L (ref 135–145)

## 2014-11-25 LAB — POCT URINALYSIS DIPSTICK
BILIRUBIN UA: NEGATIVE
Blood, UA: NEGATIVE
GLUCOSE UA: NEGATIVE
LEUKOCYTES UA: NEGATIVE
Nitrite, UA: NEGATIVE
PROTEIN UA: NEGATIVE
Spec Grav, UA: 1.025
UROBILINOGEN UA: 2
pH, UA: 6

## 2014-11-25 LAB — TSH: TSH: 1.19 u[IU]/mL (ref 0.35–4.50)

## 2014-11-25 NOTE — Addendum Note (Signed)
Addended by: Elmer Picker on: 11/25/2014 11:31 AM   Modules accepted: Orders

## 2014-12-04 ENCOUNTER — Encounter: Payer: Self-pay | Admitting: Adult Health

## 2014-12-04 DIAGNOSIS — H2513 Age-related nuclear cataract, bilateral: Secondary | ICD-10-CM | POA: Diagnosis not present

## 2014-12-04 DIAGNOSIS — H40013 Open angle with borderline findings, low risk, bilateral: Secondary | ICD-10-CM | POA: Diagnosis not present

## 2014-12-04 DIAGNOSIS — H43813 Vitreous degeneration, bilateral: Secondary | ICD-10-CM | POA: Diagnosis not present

## 2014-12-17 DIAGNOSIS — H2511 Age-related nuclear cataract, right eye: Secondary | ICD-10-CM | POA: Diagnosis not present

## 2015-01-07 ENCOUNTER — Encounter: Payer: Self-pay | Admitting: Adult Health

## 2015-01-07 ENCOUNTER — Ambulatory Visit (INDEPENDENT_AMBULATORY_CARE_PROVIDER_SITE_OTHER): Payer: Medicare Other | Admitting: Adult Health

## 2015-01-07 VITALS — BP 140/80 | HR 70 | Temp 98.1°F | Ht 66.5 in | Wt 256.0 lb

## 2015-01-07 DIAGNOSIS — I10 Essential (primary) hypertension: Secondary | ICD-10-CM | POA: Diagnosis not present

## 2015-01-07 DIAGNOSIS — Z0000 Encounter for general adult medical examination without abnormal findings: Secondary | ICD-10-CM | POA: Diagnosis not present

## 2015-01-07 DIAGNOSIS — Z23 Encounter for immunization: Secondary | ICD-10-CM

## 2015-01-07 MED ORDER — LISINOPRIL 5 MG PO TABS: 5.0000 mg | ORAL_TABLET | Freq: Every day | ORAL | Status: DC

## 2015-01-07 NOTE — Progress Notes (Addendum)
Subjective:    Patient ID: [name], male    DOB: 1946/02/17, 69 y.o.   MRN: [id number]  HPI [name] is a 69 year old married male nonsmoker who comes in today for a physical exam  because of a history of hypertension  He takes 10 mg of Norvasc along with hydrochlorothiazide 25 mg daily BP 140/90 no side effects to medication. He monitors his blood pressures at home and endorses that they are running in the 130's-140's.   He had bilateral cataract surgery on August 8th of this year. Has had no complications from this surgery.   He gets routine eye care, dental care, colonoscopy, tetanus booster today  Cognitive function normal he walks on a regular basis home health safety reviewed no issues identified, no guns in the house, he does have a health care power of attorney and living well. He is going to the [facility] for three days a week and is eating healthy.   Review of Systems  Constitutional: Negative.   HENT: Negative.   Eyes: Negative.   Respiratory: Negative.   Cardiovascular: Negative.   Gastrointestinal: Negative.   Endocrine: Negative.   Genitourinary: Negative.   Musculoskeletal: Negative.   Skin: Negative.   Allergic/Immunologic: Negative.   Neurological: Negative.   Hematological: Negative.   Psychiatric/Behavioral: Negative.   All other systems reviewed and are negative.  Past Medical History  Diagnosis Date  . Hypertension   . Arthritis   . Complication of anesthesia   . Neuromuscular disorder     pinched nerve in foot    Social History   Social History  . Marital Status: Married    Spouse Name: N/A  . Number of Children: N/A  . Years of Education: N/A   Occupational History  . Not on file.   Social History Main Topics  . Smoking status: Former Research scientist (life sciences)  . Smokeless tobacco: Not on file  . Alcohol Use: Yes     Comment: holidays  . Drug Use: No  . Sexual Activity: Not on file     Comment: quit 40 yrs ago   Other Topics Concern  . Not on  file   Social History Narrative    Past Surgical History  Procedure Laterality Date  . Joint replacement      bilateral knee replacenent  . Total knee revision  05/30/2011    Procedure: TOTAL KNEE REVISION;  Surgeon: Meredith Pel, MD;  Location: [facility];  Service: Orthopedics;  Laterality: Right;  Right total knee arthroplasty polyethylene spacer exchange    Family History  Problem Relation Age of Onset  . Hypertension Other     No Known Allergies  Current Outpatient Prescriptions on File Prior to Visit  Medication Sig Dispense Refill  . amLODipine (NORVASC) 10 MG tablet TAKE 1 TABLET BY MOUTH EVERY DAY 100 tablet 0  . aspirin 81 MG tablet Take 81 mg by mouth daily.    . fluocinonide cream (LIDEX) 0.05 % Apply topically 2 (two) times daily. 60 g 3  . hydrochlorothiazide (HYDRODIURIL) 25 MG tablet TAKE 1 TABLET BY MOUTH DAILY. 100 tablet 0   No current facility-administered medications on file prior to visit.    BP 140/80 mmHg  Pulse 70  Temp(Src) 98.1 F (36.7 C) (Oral)  Ht 5' 6.5" (1.689 m)  Wt 256 lb (116.121 kg)  BMI 40.71 kg/m2       Objective:   Physical Exam  Constitutional: He is oriented to person, place, and time. He appears well-developed  and well-nourished. No distress.  HENT:  Head: Normocephalic and atraumatic.  Right Ear: External ear normal.  Left Ear: External ear normal.  Nose: Nose normal.  Mouth/Throat: Oropharynx is clear and moist. No oropharyngeal exudate.  Eyes: Conjunctivae and EOM are normal. Pupils are equal, round, and reactive to light. Right eye exhibits no discharge. Left eye exhibits no discharge. No scleral icterus.  Neck: Normal range of motion. Neck supple. No thyromegaly present.  Cardiovascular: Normal rate, regular rhythm, normal heart sounds and intact distal pulses.  Exam reveals no gallop and no friction rub.   No murmur heard. No carotid bruit  Pulmonary/Chest: Effort normal and breath sounds normal. No respiratory  distress. He has no wheezes. He has no rales. He exhibits no tenderness.  Abdominal: Soft. Bowel sounds are normal. He exhibits no distension and no mass. There is no tenderness. There is no rebound and no guarding.  Genitourinary: Rectum normal, prostate normal and penis normal. Guaiac negative stool.  Musculoskeletal: Normal range of motion. He exhibits no edema or tenderness.  Lymphadenopathy:    He has no cervical adenopathy.  Neurological: He is alert and oriented to person, place, and time. He has normal reflexes.  Skin: Skin is warm and dry. No rash noted. He is not diaphoretic. No erythema. No pallor.  Psychiatric: He has a normal mood and affect. His behavior is normal. Judgment and thought content normal.  Nursing note and vitals reviewed.     Assessment & Plan:  1. Routine general medical examination at a health care facility - Reviewed medicare wellness questionnaire  2. Essential hypertension - EKG 12-Lead- NSR, Rate 66 -Start lisinopril (PRINIVIL,ZESTRIL) 5 MG tablet; Take 1 tablet (5 mg total) by mouth daily.  Dispense: 30 tablet; Refill: 3 - Monitor BP at home and inform me of BP over the next week.   3. Needs flu shot - Flu Vaccine QUAD 36+ mos PF IM (Fluarix & Fluzone Quad PF)

## 2015-01-07 NOTE — Addendum Note (Signed)
Addended by: Apolinar Junes on: 01/07/2015 09:54 AM   Modules accepted: Miquel Dunn

## 2015-01-07 NOTE — Progress Notes (Signed)
Pre visit review using our clinic review tool, if applicable. No additional management support is needed unless otherwise documented below in the visit note. 

## 2015-01-07 NOTE — Patient Instructions (Signed)
It was great meeting you today!  Your blood pressure is elevated. Please start Lisinopril 5mg , monitor your blood pressure and let me know how it is doing after a week or two.   Continue to exercise and eat healthy.   Follow up in one year for physical or sooner if needed.

## 2015-01-30 ENCOUNTER — Other Ambulatory Visit: Payer: Self-pay | Admitting: Family Medicine

## 2015-02-09 ENCOUNTER — Ambulatory Visit (INDEPENDENT_AMBULATORY_CARE_PROVIDER_SITE_OTHER): Payer: Medicare Other | Admitting: Adult Health

## 2015-02-09 ENCOUNTER — Encounter: Payer: Self-pay | Admitting: Adult Health

## 2015-02-09 VITALS — BP 148/90 | HR 70 | Temp 97.9°F | Resp 20 | Ht 69.0 in | Wt 258.6 lb

## 2015-02-09 DIAGNOSIS — I10 Essential (primary) hypertension: Secondary | ICD-10-CM

## 2015-02-09 MED ORDER — LOSARTAN POTASSIUM 25 MG PO TABS: 25.0000 mg | ORAL_TABLET | Freq: Every day | ORAL | Status: DC

## 2015-02-09 NOTE — Progress Notes (Signed)
Pre visit review using our clinic review tool, if applicable. No additional management support is needed unless otherwise documented below in the visit note. 

## 2015-02-09 NOTE — Progress Notes (Signed)
Subjective:    Patient ID: [name], male    DOB: 1945-08-08, 69 y.o.   MRN: [id number]  HPI  69 year old male who presents to the office today for med follow up. We had started him on Lisinopril on 01/07/2015. Since he started taking it he has had a dry cough. Per patient, this has happened in the past when he was on Lisinopril. The cough is keeping him up at night. He has been monitoring his blood pressure at home and his blood pressures have been 140/80 consistently.     Review of Systems  Respiratory: Positive for cough. Negative for apnea, chest tightness, shortness of breath, wheezing and stridor.   Cardiovascular: Negative.   Musculoskeletal: Negative.   Neurological: Negative.   All other systems reviewed and are negative.  Past Medical History  Diagnosis Date  . Hypertension   . Arthritis   . Complication of anesthesia   . Neuromuscular disorder     pinched nerve in foot    Social History   Social History  . Marital Status: Married    Spouse Name: N/A  . Number of Children: N/A  . Years of Education: N/A   Occupational History  . Not on file.   Social History Main Topics  . Smoking status: Former Research scientist (life sciences)  . Smokeless tobacco: Not on file  . Alcohol Use: Yes     Comment: holidays  . Drug Use: No  . Sexual Activity: Not on file     Comment: quit 40 yrs ago   Other Topics Concern  . Not on file   Social History Narrative    Past Surgical History  Procedure Laterality Date  . Joint replacement      bilateral knee replacenent  . Total knee revision  05/30/2011    Procedure: TOTAL KNEE REVISION;  Surgeon: Meredith Pel, MD;  Location: [facility];  Service: Orthopedics;  Laterality: Right;  Right total knee arthroplasty polyethylene spacer exchange    Family History  Problem Relation Age of Onset  . Hypertension Other     No Known Allergies  Current Outpatient Prescriptions on File Prior to Visit  Medication Sig Dispense Refill  .  amLODipine (NORVASC) 10 MG tablet TAKE 1 TABLET BY MOUTH EVERY DAY 100 tablet 3  . aspirin 81 MG tablet Take 81 mg by mouth daily.    . fluocinonide cream (LIDEX) 0.05 % Apply topically 2 (two) times daily. 60 g 3  . hydrochlorothiazide (HYDRODIURIL) 25 MG tablet TAKE 1 TABLET BY MOUTH DAILY. 100 tablet 3  . prednisoLONE sodium phosphate (INFLAMASE FORTE) 1 % ophthalmic solution PLACE 1 DROP IN SURGICAL EYE FOUR TIMES A DAY AFTER SURGERY  1   No current facility-administered medications on file prior to visit.    BP 148/90 mmHg  Pulse 70  Temp(Src) 97.9 F (36.6 C) (Oral)  Resp 20  Ht 5\' 9"  (1.753 m)  Wt 258 lb 9.6 oz (117.3 kg)  BMI 38.17 kg/m2  SpO2 97%       Objective:   Physical Exam  Constitutional: He is oriented to person, place, and time. He appears well-developed and well-nourished. No distress.  Cardiovascular: Normal rate, normal heart sounds and intact distal pulses.  Exam reveals no gallop and no friction rub.   No murmur heard. Pulmonary/Chest: Effort normal and breath sounds normal. No respiratory distress. He has no wheezes. He has no rales. He exhibits no tenderness.  Abdominal:  Obese around abdomen.   Musculoskeletal:  Normal range of motion.  Lymphadenopathy:    He has no cervical adenopathy.  Neurological: He is alert and oriented to person, place, and time.  Skin: Skin is warm and dry. No rash noted. He is not diaphoretic. No erythema. No pallor.  Psychiatric: He has a normal mood and affect. His behavior is normal. Judgment and thought content normal.  Nursing note and vitals reviewed.      Assessment & Plan:  1. Essential hypertension - D/c lisinopril 5 mg  - losartan (COZAAR) 25 MG tablet; Take 1 tablet (25 mg total) by mouth daily.  Dispense: 30 tablet; Refill: 3 - Continue to monitor BP at home  - Bring log to physical in October - If BP becomes low, less than 110, then notify this provider and stop medication.

## 2015-02-09 NOTE — Patient Instructions (Signed)
It was great seeing you today!  Stop the lisinopril and start the losartan. Your cough should go away after you stop the lisinopril.   Continue to monitor your blood pressure at home and let me know if it is dropping below 120/80.   We will follow up on this at your next appointment.   Use Flonase and zyrtec for your cold like symptoms.

## 2015-02-11 ENCOUNTER — Encounter (INDEPENDENT_AMBULATORY_CARE_PROVIDER_SITE_OTHER): Payer: Medicare Other | Admitting: Ophthalmology

## 2015-02-11 DIAGNOSIS — H59031 Cystoid macular edema following cataract surgery, right eye: Secondary | ICD-10-CM | POA: Diagnosis not present

## 2015-02-11 DIAGNOSIS — H35033 Hypertensive retinopathy, bilateral: Secondary | ICD-10-CM

## 2015-02-11 DIAGNOSIS — I10 Essential (primary) hypertension: Secondary | ICD-10-CM

## 2015-02-11 DIAGNOSIS — H2512 Age-related nuclear cataract, left eye: Secondary | ICD-10-CM

## 2015-02-11 DIAGNOSIS — H43813 Vitreous degeneration, bilateral: Secondary | ICD-10-CM

## 2015-02-25 ENCOUNTER — Encounter: Payer: Self-pay | Admitting: Adult Health

## 2015-02-25 ENCOUNTER — Ambulatory Visit (INDEPENDENT_AMBULATORY_CARE_PROVIDER_SITE_OTHER): Payer: Medicare Other | Admitting: Adult Health

## 2015-02-25 VITALS — BP 110/70 | Temp 97.5°F | Ht 69.0 in | Wt 258.6 lb

## 2015-02-25 DIAGNOSIS — R05 Cough: Secondary | ICD-10-CM | POA: Diagnosis not present

## 2015-02-25 DIAGNOSIS — Z7189 Other specified counseling: Secondary | ICD-10-CM

## 2015-02-25 DIAGNOSIS — R059 Cough, unspecified: Secondary | ICD-10-CM

## 2015-02-25 DIAGNOSIS — Z7689 Persons encountering health services in other specified circumstances: Secondary | ICD-10-CM

## 2015-02-25 MED ORDER — PREDNISONE 20 MG PO TABS: 20.0000 mg | ORAL_TABLET | Freq: Every day | ORAL | Status: DC

## 2015-02-25 MED ORDER — HYDROCODONE-HOMATROPINE 5-1.5 MG/5ML PO SYRP
5.0000 mL | ORAL_SOLUTION | Freq: Three times a day (TID) | ORAL | Status: DC | PRN
Start: 2015-02-25 — End: 2015-04-29

## 2015-02-25 NOTE — Patient Instructions (Addendum)
It was great seeing you again.   I think this cough is being caused by bronchitis  Please take the prednisone as directed: Day 1 40 mg Day 2 40 mg Day 3 40 mg Day 4 20 mg Day 5 20 mg Day 6 20 mg  Use the cough syrup at night.   Follow up with me if your cough does not improve after the prednisone.    Acute Bronchitis Bronchitis is inflammation of the airways that extend from the windpipe into the lungs (bronchi). The inflammation often causes mucus to develop. This leads to a cough, which is the most common symptom of bronchitis.  In acute bronchitis, the condition usually develops suddenly and goes away over time, usually in a couple weeks. Smoking, allergies, and asthma can make bronchitis worse. Repeated episodes of bronchitis may cause further lung problems.  CAUSES Acute bronchitis is most often caused by the same virus that causes a cold. The virus can spread from person to person (contagious) through coughing, sneezing, and touching contaminated objects. SIGNS AND SYMPTOMS   Cough.   Fever.   Coughing up mucus.   Body aches.   Chest congestion.   Chills.   Shortness of breath.   Sore throat.  DIAGNOSIS  Acute bronchitis is usually diagnosed through a physical exam. Your health care provider will also ask you questions about your medical history. Tests, such as chest X-rays, are sometimes done to rule out other conditions.  TREATMENT  Acute bronchitis usually goes away in a couple weeks. Oftentimes, no medical treatment is necessary. Medicines are sometimes given for relief of fever or cough. Antibiotic medicines are usually not needed but may be prescribed in certain situations. In some cases, an inhaler may be recommended to help reduce shortness of breath and control the cough. A cool mist vaporizer may also be used to help thin bronchial secretions and make it easier to clear the chest.  HOME CARE INSTRUCTIONS  Get plenty of rest.   Drink enough  fluids to keep your urine clear or pale yellow (unless you have a medical condition that requires fluid restriction). Increasing fluids may help thin your respiratory secretions (sputum) and reduce chest congestion, and it will prevent dehydration.   Take medicines only as directed by your health care provider.  If you were prescribed an antibiotic medicine, finish it all even if you start to feel better.  Avoid smoking and secondhand smoke. Exposure to cigarette smoke or irritating chemicals will make bronchitis worse. If you are a smoker, consider using nicotine gum or skin patches to help control withdrawal symptoms. Quitting smoking will help your lungs heal faster.   Reduce the chances of another bout of acute bronchitis by washing your hands frequently, avoiding people with cold symptoms, and trying not to touch your hands to your mouth, nose, or eyes.   Keep all follow-up visits as directed by your health care provider.  SEEK MEDICAL CARE IF: Your symptoms do not improve after 1 week of treatment.  SEEK IMMEDIATE MEDICAL CARE IF:  You develop an increased fever or chills.   You have chest pain.   You have severe shortness of breath.  You have bloody sputum.   You develop dehydration.  You faint or repeatedly feel like you are going to pass out.  You develop repeated vomiting.  You develop a severe headache. MAKE SURE YOU:   Understand these instructions.  Will watch your condition.  Will get help right away if you are not  doing well or get worse.   This information is not intended to replace advice given to you by your health care provider. Make sure you discuss any questions you have with your health care provider.   Document Released: 06/08/2004 Document Revised: 05/22/2014 Document Reviewed: 10/22/2012 Elsevier Interactive Patient Education [employer].

## 2015-02-25 NOTE — Progress Notes (Signed)
HPI:  [name] is here to establish care. He is a pleasant AA male who  has a past medical history of Hypertension; Arthritis; Complication of anesthesia; and Neuromuscular disorder ([place]).  Last PCP and physical: July 2016 by NP Brave Dack  Immunizations:UTD Diet:Tries to eat healthy. Does not eat a lot of fried foods Exercise: Goes to the [facility] three days a week.  Colonoscopy: Due in 2018  Has the following chronic problems that require follow up and concerns today:  Cough - He started to have a cough when we placed him on Lisinopril. We switched him to an ARB and he continues to have a cough. He feels like this cough is different than his previous cough. He endorses production and feels like "this cough is deeper". Denies any fevers, n/v/d.   HTN - This is controlled on current medications. Denies any headaches, blurred vision or dizziness.   ROS negative for unless reported above: fevers, chills,feeling poorly, unintentional weight loss, hearing or vision loss, chest pain, palpitations, leg claudication, struggling to breath,Not feeling congested in the chest, no orthopenia,no wheezing, normal appetite, no soft tissue swelling, no hemoptysis, melena, hematochezia, hematuria, falls, loc, si, or thoughts of self harm.   Past Medical History  Diagnosis Date  . Hypertension   . Arthritis   . Complication of anesthesia   . Neuromuscular disorder ([place])     pinched nerve in foot    Past Surgical History  Procedure Laterality Date  . Joint replacement      bilateral knee replacenent  . Total knee revision  05/30/2011    Procedure: TOTAL KNEE REVISION;  Surgeon: Meredith Pel, MD;  Location: [facility];  Service: Orthopedics;  Laterality: Right;  Right total knee arthroplasty polyethylene spacer exchange    Family History  Problem Relation Age of Onset  . Hypertension Other     Social History   Social History  . Marital Status: Married    Spouse Name: N/A  . Number  of Children: N/A  . Years of Education: N/A   Social History Main Topics  . Smoking status: Former Research scientist (life sciences)  . Smokeless tobacco: None  . Alcohol Use: Yes     Comment: holidays  . Drug Use: No  . Sexual Activity: Not Asked     Comment: quit 40 yrs ago   Other Topics Concern  . None   Social History Narrative     Current outpatient prescriptions:  .  amLODipine (NORVASC) 10 MG tablet, TAKE 1 TABLET BY MOUTH EVERY DAY, Disp: 100 tablet, Rfl: 3 .  aspirin 81 MG tablet, Take 81 mg by mouth daily., Disp: , Rfl:  .  fluocinonide cream (LIDEX) 0.05 %, Apply topically 2 (two) times daily., Disp: 60 g, Rfl: 3 .  hydrochlorothiazide (HYDRODIURIL) 25 MG tablet, TAKE 1 TABLET BY MOUTH DAILY., Disp: 100 tablet, Rfl: 3 .  losartan (COZAAR) 25 MG tablet, Take 1 tablet (25 mg total) by mouth daily., Disp: 30 tablet, Rfl: 3 .  prednisoLONE sodium phosphate (INFLAMASE FORTE) 1 % ophthalmic solution, PLACE 1 DROP IN SURGICAL EYE FOUR TIMES A DAY AFTER SURGERY, Disp: , Rfl: 1 .  ketorolac (ACULAR) 0.4 % SOLN, 1 DROP IN SURGICAL EYE FOUR TIMES A DAY STARTING WEDNESDAY BEFORE SURGERY, Disp: , Rfl: 1 .  ketorolac (ACULAR) 0.5 % ophthalmic solution, , Disp: , Rfl:   EXAM:  Filed Vitals:   02/25/15 0829  BP: 110/70  Temp: 97.5 F (36.4 C)    Body mass  index is 38.17 kg/(m^2).  GENERAL: vitals reviewed and listed above, alert, oriented, appears well hydrated and in no acute distress  HEENT: atraumatic, conjunttiva clear, no obvious abnormalities on inspection of external nose and ears. TM's visualized, no cerumen impaction  NECK: Neck is soft and supple without masses, no adenopathy or thyromegaly, trachea midline, no JVD. Normal range of motion.   LUNGS: clear to auscultation bilaterally, no wheezes, rales or rhonchi, good air movement. Prolonged expiration  CV: Regular rate and rhythm, normal S1/S2, no audible murmurs, gallops, or rubs. No carotid bruit and no peripheral edema.   MS: moves  all extremities without noticeable abnormality. No edema noted  Abd: soft/nontender/nondistended/normal bowel sounds. Obese around abdomen.   Skin: warm and dry, no rash   Extremities: No clubbing, cyanosis, or edema. Capillary refill is WNL. Pulses intact bilaterally in upper and lower extremities.   Neuro: CN II-XII intact, sensation and reflexes normal throughout, 5/5 muscle strength in bilateral upper and lower extremities. Normal finger to nose. Normal rapid alternating movements. PSYCH: pleasant and cooperative, no obvious depression or anxiety  ASSESSMENT AND PLAN:  1. Encounter to establish care - Follow up in July 2017 for next CPE - Follow up sooner if needed - Work on losing weight. Increase cardio during time at the gym.    2. Cough  - Likely bronchitis. He is a former smoker, can not r/o COPD. May be from ARB.  - predniSONE (DELTASONE) 20 MG tablet; Take 1 tablet (20 mg total) by mouth daily with breakfast.  Dispense: 9 tablet; Refill: 0 40 mg x 3 days, 20 mg x 3 days.  - HYDROcodone-homatropine (HYCODAN) 5-1.5 MG/5ML syrup; Take 5 mLs by mouth every 8 (eight) hours as needed for cough.  Dispense: 120 mL; Refill: 0- use at night - Did not want a chest xray today - Consider changing HTN medication - Consider referral to pulmonology   -We reviewed the PMH, PSH, FH, SH, Meds and Allergies. -We provided refills for any medications we will prescribe as needed. -We addressed current concerns per orders and patient instructions. -We have asked for records for pertinent exams, studies, vaccines and notes from previous providers. -We have advised patient to follow up per instructions below.   -Patient advised to return or notify a provider immediately if symptoms worsen or persist or new concerns arise.    [name], AGNP

## 2015-03-18 ENCOUNTER — Telehealth: Payer: Self-pay | Admitting: *Deleted

## 2015-03-18 DIAGNOSIS — I10 Essential (primary) hypertension: Secondary | ICD-10-CM

## 2015-03-18 MED ORDER — LOSARTAN POTASSIUM 25 MG PO TABS: 25.0000 mg | ORAL_TABLET | Freq: Every day | ORAL | Status: DC

## 2015-03-18 NOTE — Telephone Encounter (Signed)
Rx sent to pharmacy   

## 2015-03-18 NOTE — Telephone Encounter (Signed)
[facility] is requesting a 90 refill  losartan (COZAAR) 25 MG tablet [facility] [address]

## 2015-03-25 ENCOUNTER — Encounter (INDEPENDENT_AMBULATORY_CARE_PROVIDER_SITE_OTHER): Payer: Medicare Other | Admitting: Ophthalmology

## 2015-03-25 DIAGNOSIS — H59031 Cystoid macular edema following cataract surgery, right eye: Secondary | ICD-10-CM

## 2015-03-25 DIAGNOSIS — H2512 Age-related nuclear cataract, left eye: Secondary | ICD-10-CM

## 2015-03-25 DIAGNOSIS — H35033 Hypertensive retinopathy, bilateral: Secondary | ICD-10-CM | POA: Diagnosis not present

## 2015-03-25 DIAGNOSIS — I10 Essential (primary) hypertension: Secondary | ICD-10-CM

## 2015-03-25 DIAGNOSIS — H43813 Vitreous degeneration, bilateral: Secondary | ICD-10-CM

## 2015-04-08 ENCOUNTER — Emergency Department (HOSPITAL_COMMUNITY)
Admission: EM | Admit: 2015-04-08 | Discharge: 2015-04-08 | Disposition: A | Discharge status: Home / Self Care | Payer: Medicare Other | Attending: Emergency Medicine | Admitting: Emergency Medicine

## 2015-04-08 ENCOUNTER — Emergency Department (HOSPITAL_COMMUNITY): Payer: Medicare Other

## 2015-04-08 ENCOUNTER — Encounter (HOSPITAL_COMMUNITY): Payer: Self-pay

## 2015-04-08 DIAGNOSIS — Z7952 Long term (current) use of systemic steroids: Secondary | ICD-10-CM | POA: Diagnosis not present

## 2015-04-08 DIAGNOSIS — Y9389 Activity, other specified: Secondary | ICD-10-CM | POA: Insufficient documentation

## 2015-04-08 DIAGNOSIS — S29001A Unspecified injury of muscle and tendon of front wall of thorax, initial encounter: Secondary | ICD-10-CM | POA: Diagnosis present

## 2015-04-08 DIAGNOSIS — Y92410 Unspecified street and highway as the place of occurrence of the external cause: Secondary | ICD-10-CM | POA: Diagnosis not present

## 2015-04-08 DIAGNOSIS — Z87891 Personal history of nicotine dependence: Secondary | ICD-10-CM | POA: Diagnosis not present

## 2015-04-08 DIAGNOSIS — Z79899 Other long term (current) drug therapy: Secondary | ICD-10-CM | POA: Insufficient documentation

## 2015-04-08 DIAGNOSIS — Z7982 Long term (current) use of aspirin: Secondary | ICD-10-CM | POA: Diagnosis not present

## 2015-04-08 DIAGNOSIS — M1990 Unspecified osteoarthritis, unspecified site: Secondary | ICD-10-CM | POA: Diagnosis not present

## 2015-04-08 DIAGNOSIS — S20212A Contusion of left front wall of thorax, initial encounter: Secondary | ICD-10-CM | POA: Insufficient documentation

## 2015-04-08 DIAGNOSIS — I10 Essential (primary) hypertension: Secondary | ICD-10-CM | POA: Insufficient documentation

## 2015-04-08 DIAGNOSIS — T148 Other injury of unspecified body region: Secondary | ICD-10-CM | POA: Diagnosis not present

## 2015-04-08 DIAGNOSIS — S299XXA Unspecified injury of thorax, initial encounter: Secondary | ICD-10-CM | POA: Diagnosis not present

## 2015-04-08 DIAGNOSIS — R52 Pain, unspecified: Secondary | ICD-10-CM | POA: Diagnosis not present

## 2015-04-08 DIAGNOSIS — Y998 Other external cause status: Secondary | ICD-10-CM | POA: Insufficient documentation

## 2015-04-08 MED ORDER — HYDROCODONE-ACETAMINOPHEN 5-325 MG PO TABS: 2.0000 | ORAL_TABLET | ORAL | Status: DC | PRN

## 2015-04-08 NOTE — Discharge Instructions (Signed)
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
## 2015-04-29 ENCOUNTER — Ambulatory Visit (INDEPENDENT_AMBULATORY_CARE_PROVIDER_SITE_OTHER): Payer: Medicare Other | Admitting: Adult Health

## 2015-04-29 ENCOUNTER — Encounter: Payer: Self-pay | Admitting: Adult Health

## 2015-04-29 VITALS — BP 118/70 | HR 85 | Temp 98.0°F | Ht 69.0 in | Wt 260.7 lb

## 2015-04-29 DIAGNOSIS — J209 Acute bronchitis, unspecified: Secondary | ICD-10-CM

## 2015-04-29 MED ORDER — METHYLPREDNISOLONE 4 MG PO TBPK: ORAL_TABLET | ORAL | Status: DC

## 2015-04-29 MED ORDER — HYDROCODONE-HOMATROPINE 5-1.5 MG/5ML PO SYRP: 5.0000 mL | ORAL_SOLUTION | Freq: Three times a day (TID) | ORAL | Status: DC | PRN

## 2015-04-29 NOTE — Progress Notes (Signed)
Subjective:    Patient ID: [name], male    DOB: 1946/04/22, 69 y.o.   MRN: [id number]  HPI  69 year old male who presents to the office today for semi productive cough x one week. He states " just when I think it is going away, it comes back."  His ribs hurt from the coughing.   Cough keeps him at night.   He has used OTC medication ( not sure what it is)  But it did not work.     Review of Systems  Constitutional: Negative.  Negative for fever, chills, appetite change and fatigue.  HENT: Positive for congestion. Negative for ear discharge, ear pain, postnasal drip, rhinorrhea and sinus pressure.   Respiratory: Positive for cough. Negative for chest tightness, shortness of breath and wheezing.   Cardiovascular: Negative.   Neurological: Negative.   All other systems reviewed and are negative.  Past Medical History  Diagnosis Date  . Hypertension   . Arthritis   . Complication of anesthesia   . Neuromuscular disorder ([place])     pinched nerve in foot    Social History   Social History  . Marital Status: Married    Spouse Name: N/A  . Number of Children: N/A  . Years of Education: N/A   Occupational History  . Not on file.   Social History Main Topics  . Smoking status: Former Research scientist (life sciences)  . Smokeless tobacco: Not on file  . Alcohol Use: Yes     Comment: holidays  . Drug Use: No  . Sexual Activity: Not on file     Comment: quit 40 yrs ago   Other Topics Concern  . Not on file   Social History Narrative   Retired for an Research scientist (physical sciences) for a [employer]   Three children who live in [place]       Past Surgical History  Procedure Laterality Date  . Joint replacement      bilateral knee replacenent  . Total knee revision  05/30/2011    Procedure: TOTAL KNEE REVISION;  Surgeon: Meredith Pel, MD;  Location: [place];  Service: Orthopedics;  Laterality: Right;  Right total knee arthroplasty polyethylene spacer exchange  . Cataract  extraction Right     Family History  Problem Relation Age of Onset  . Diabetes Sister   . Stroke Sister     No Known Allergies  Current Outpatient Prescriptions on File Prior to Visit  Medication Sig Dispense Refill  . amLODipine (NORVASC) 10 MG tablet TAKE 1 TABLET BY MOUTH EVERY DAY 100 tablet 3  . aspirin 81 MG tablet Take 81 mg by mouth daily.    . fluocinonide cream (LIDEX) 0.05 % Apply topically 2 (two) times daily. (Patient taking differently: Apply 1 application topically daily as needed (FOR FEET). ) 60 g 3  . hydrochlorothiazide (HYDRODIURIL) 25 MG tablet TAKE 1 TABLET BY MOUTH DAILY. 100 tablet 3  . losartan (COZAAR) 25 MG tablet Take 1 tablet (25 mg total) by mouth daily. 30 tablet 9  . prednisoLONE sodium phosphate (INFLAMASE FORTE) 1 % ophthalmic solution PLACE 1 DROP IN SURGICAL EYE TWO TIMES A DAY AFTER SURGERY  1   No current facility-administered medications on file prior to visit.    BP 118/70 mmHg  Pulse 85  Temp(Src) 98 F (36.7 C) (Oral)  Ht 5\' 9"  (1.753 m)  Wt 260 lb 11.2 oz (118.253 kg)  BMI 38.48 kg/m2  SpO2 97%  Objective:   Physical Exam  Constitutional: He is oriented to person, place, and time. He appears well-developed and well-nourished. No distress.  Neck: Normal range of motion. Neck supple. No thyromegaly present.  Cardiovascular: Normal rate, regular rhythm, normal heart sounds and intact distal pulses.  Exam reveals no gallop and no friction rub.   No murmur heard. Pulmonary/Chest: Effort normal and breath sounds normal. No respiratory distress. He has no wheezes. He has no rales. He exhibits no tenderness.  Lymphadenopathy:    He has no cervical adenopathy.  Neurological: He is alert and oriented to person, place, and time.  Skin: Skin is warm and dry. No rash noted. He is not diaphoretic. No erythema. No pallor.  Psychiatric: He has a normal mood and affect. His behavior is normal. Judgment and thought content normal.  Nursing  note and vitals reviewed.     Assessment & Plan:   1. Acute bronchitis, unspecified organism  - HYDROcodone-homatropine (HYCODAN) 5-1.5 MG/5ML syrup; Take 5 mLs by mouth every 8 (eight) hours as needed for cough.  Dispense: 120 mL; Refill: 0 - methylPREDNISolone (MEDROL DOSEPAK) 4 MG TBPK tablet; Take as directed  Dispense: 21 tablet; Refill: 0

## 2015-04-29 NOTE — Progress Notes (Signed)
Pre visit review using our clinic review tool, if applicable. No additional management support is needed unless otherwise documented below in the visit note. 

## 2015-04-29 NOTE — Patient Instructions (Addendum)
D[place],   As always, it was great seeing you!  Your exam is consistent with bronchitis. I have sent a prescription for prednisone to the pharmacy. Use the cough syrup at night as it will make you sleepy.   You can use Mucinex during the day.   Follow up if no improvement 2-3 days.   Acute Bronchitis Bronchitis is inflammation of the airways that extend from the windpipe into the lungs (bronchi). The inflammation often causes mucus to develop. This leads to a cough, which is the most common symptom of bronchitis.  In acute bronchitis, the condition usually develops suddenly and goes away over time, usually in a couple weeks. Smoking, allergies, and asthma can make bronchitis worse. Repeated episodes of bronchitis may cause further lung problems.  CAUSES Acute bronchitis is most often caused by the same virus that causes a cold. The virus can spread from person to person (contagious) through coughing, sneezing, and touching contaminated objects. SIGNS AND SYMPTOMS   Cough.   Fever.   Coughing up mucus.   Body aches.   Chest congestion.   Chills.   Shortness of breath.   Sore throat.  DIAGNOSIS  Acute bronchitis is usually diagnosed through a physical exam. Your health care provider will also ask you questions about your medical history. Tests, such as chest X-rays, are sometimes done to rule out other conditions.  TREATMENT  Acute bronchitis usually goes away in a couple weeks. Oftentimes, no medical treatment is necessary. Medicines are sometimes given for relief of fever or cough. Antibiotic medicines are usually not needed but may be prescribed in certain situations. In some cases, an inhaler may be recommended to help reduce shortness of breath and control the cough. A cool mist vaporizer may also be used to help thin bronchial secretions and make it easier to clear the chest.  HOME CARE INSTRUCTIONS  Get plenty of rest.   Drink enough fluids to keep your urine  clear or pale yellow (unless you have a medical condition that requires fluid restriction). Increasing fluids may help thin your respiratory secretions (sputum) and reduce chest congestion, and it will prevent dehydration.   Take medicines only as directed by your health care provider.  If you were prescribed an antibiotic medicine, finish it all even if you start to feel better.  Avoid smoking and secondhand smoke. Exposure to cigarette smoke or irritating chemicals will make bronchitis worse. If you are a smoker, consider using nicotine gum or skin patches to help control withdrawal symptoms. Quitting smoking will help your lungs heal faster.   Reduce the chances of another bout of acute bronchitis by washing your hands frequently, avoiding people with cold symptoms, and trying not to touch your hands to your mouth, nose, or eyes.   Keep all follow-up visits as directed by your health care provider.  SEEK MEDICAL CARE IF: Your symptoms do not improve after 1 week of treatment.  SEEK IMMEDIATE MEDICAL CARE IF:  You develop an increased fever or chills.   You have chest pain.   You have severe shortness of breath.  You have bloody sputum.   You develop dehydration.  You faint or repeatedly feel like you are going to pass out.  You develop repeated vomiting.  You develop a severe headache. MAKE SURE YOU:   Understand these instructions.  Will watch your condition.  Will get help right away if you are not doing well or get worse.   This information is not intended to replace  advice given to you by your health care provider. Make sure you discuss any questions you have with your health care provider.   Document Released: 06/08/2004 Document Revised: 05/22/2014 Document Reviewed: 10/22/2012 Elsevier Interactive Patient Education [employer].

## 2015-08-05 ENCOUNTER — Encounter: Payer: Self-pay | Admitting: Adult Health

## 2015-08-05 ENCOUNTER — Ambulatory Visit (INDEPENDENT_AMBULATORY_CARE_PROVIDER_SITE_OTHER): Payer: Medicare Other | Admitting: Adult Health

## 2015-08-05 VITALS — BP 138/70 | HR 65 | Temp 97.8°F | Ht 69.0 in | Wt 265.0 lb

## 2015-08-05 DIAGNOSIS — J209 Acute bronchitis, unspecified: Secondary | ICD-10-CM | POA: Diagnosis not present

## 2015-08-05 MED ORDER — DOXYCYCLINE HYCLATE 100 MG PO CAPS: 100.0000 mg | ORAL_CAPSULE | Freq: Two times a day (BID) | ORAL | Status: DC

## 2015-08-05 MED ORDER — PREDNISONE 10 MG PO TABS: ORAL_TABLET | ORAL | Status: DC

## 2015-08-05 NOTE — Patient Instructions (Addendum)
Your exam is consistent with bronchitis.   I have sent in a prescription for prednisone and doxycycline.   Take the prednisone as directed.   40 mg x 3 days 20 mg x 3 days 10 mg x 3 days.   Take the doxycycline twice a day for 7 days  You can use OTC Mucinex Cough to help control the symptoms      Acute Bronchitis Bronchitis is inflammation of the airways that extend from the windpipe into the lungs (bronchi). The inflammation often causes mucus to develop. This leads to a cough, which is the most common symptom of bronchitis.  In acute bronchitis, the condition usually develops suddenly and goes away over time, usually in a couple weeks. Smoking, allergies, and asthma can make bronchitis worse. Repeated episodes of bronchitis may cause further lung problems.  CAUSES Acute bronchitis is most often caused by the same virus that causes a cold. The virus can spread from person to person (contagious) through coughing, sneezing, and touching contaminated objects. SIGNS AND SYMPTOMS   Cough.   Fever.   Coughing up mucus.   Body aches.   Chest congestion.   Chills.   Shortness of breath.   Sore throat.  DIAGNOSIS  Acute bronchitis is usually diagnosed through a physical exam. Your health care provider will also ask you questions about your medical history. Tests, such as chest X-rays, are sometimes done to rule out other conditions.  TREATMENT  Acute bronchitis usually goes away in a couple weeks. Oftentimes, no medical treatment is necessary. Medicines are sometimes given for relief of fever or cough. Antibiotic medicines are usually not needed but may be prescribed in certain situations. In some cases, an inhaler may be recommended to help reduce shortness of breath and control the cough. A cool mist vaporizer may also be used to help thin bronchial secretions and make it easier to clear the chest.  HOME CARE INSTRUCTIONS  Get plenty of rest.   Drink enough fluids  to keep your urine clear or pale yellow (unless you have a medical condition that requires fluid restriction). Increasing fluids may help thin your respiratory secretions (sputum) and reduce chest congestion, and it will prevent dehydration.   Take medicines only as directed by your health care provider.  If you were prescribed an antibiotic medicine, finish it all even if you start to feel better.  Avoid smoking and secondhand smoke. Exposure to cigarette smoke or irritating chemicals will make bronchitis worse. If you are a smoker, consider using nicotine gum or skin patches to help control withdrawal symptoms. Quitting smoking will help your lungs heal faster.   Reduce the chances of another bout of acute bronchitis by washing your hands frequently, avoiding people with cold symptoms, and trying not to touch your hands to your mouth, nose, or eyes.   Keep all follow-up visits as directed by your health care provider.  SEEK MEDICAL CARE IF: Your symptoms do not improve after 1 week of treatment.  SEEK IMMEDIATE MEDICAL CARE IF:  You develop an increased fever or chills.   You have chest pain.   You have severe shortness of breath.  You have bloody sputum.   You develop dehydration.  You faint or repeatedly feel like you are going to pass out.  You develop repeated vomiting.  You develop a severe headache. MAKE SURE YOU:   Understand these instructions.  Will watch your condition.  Will get help right away if you are not doing well or  get worse.   This information is not intended to replace advice given to you by your health care provider. Make sure you discuss any questions you have with your health care provider.   Document Released: 06/08/2004 Document Revised: 05/22/2014 Document Reviewed: 10/22/2012 Elsevier Interactive Patient Education [employer].

## 2015-08-05 NOTE — Progress Notes (Signed)
Subjective:    Patient ID: [name], male    DOB: July 25, 1945, 70 y.o.   MRN: [id number]  Cough This is a new problem. The current episode started 1 to 4 weeks ago. The problem has been unchanged. The problem occurs constantly. The cough is non-productive. Pertinent negatives include no chest pain, ear congestion, fever, rhinorrhea, sore throat or shortness of breath. The symptoms are aggravated by lying down. He has tried nothing for the symptoms. The treatment provided no relief. His past medical history is significant for bronchitis and pneumonia.   Was treated for bronchitis in December.    Review of Systems  Constitutional: Negative for fever.  HENT: Negative for rhinorrhea and sore throat.   Respiratory: Positive for cough. Negative for shortness of breath.   Cardiovascular: Negative for chest pain.  Neurological: Negative.   Psychiatric/Behavioral: Negative.    Past Medical History  Diagnosis Date  . Hypertension   . Arthritis   . Complication of anesthesia   . Neuromuscular disorder ([place])     pinched nerve in foot    Social History   Social History  . Marital Status: Married    Spouse Name: N/A  . Number of Children: N/A  . Years of Education: N/A   Occupational History  . Not on file.   Social History Main Topics  . Smoking status: Former Research scientist (life sciences)  . Smokeless tobacco: Not on file  . Alcohol Use: Yes     Comment: holidays  . Drug Use: No  . Sexual Activity: Not on file     Comment: quit 40 yrs ago   Other Topics Concern  . Not on file   Social History Narrative   Retired for an Research scientist (physical sciences) for a [employer]   Three children who live in [place]       Past Surgical History  Procedure Laterality Date  . Joint replacement      bilateral knee replacenent  . Total knee revision  05/30/2011    Procedure: TOTAL KNEE REVISION;  Surgeon: Meredith Pel, MD;  Location: [facility];  Service: Orthopedics;  Laterality: Right;  Right total  knee arthroplasty polyethylene spacer exchange  . Cataract extraction Right     Family History  Problem Relation Age of Onset  . Diabetes Sister   . Stroke Sister     No Known Allergies  Current Outpatient Prescriptions on File Prior to Visit  Medication Sig Dispense Refill  . amLODipine (NORVASC) 10 MG tablet TAKE 1 TABLET BY MOUTH EVERY DAY 100 tablet 3  . aspirin 81 MG tablet Take 81 mg by mouth daily.    . fluocinonide cream (LIDEX) 0.05 % Apply topically 2 (two) times daily. (Patient taking differently: Apply 1 application topically daily as needed (FOR FEET). ) 60 g 3  . hydrochlorothiazide (HYDRODIURIL) 25 MG tablet TAKE 1 TABLET BY MOUTH DAILY. 100 tablet 3  . losartan (COZAAR) 25 MG tablet Take 1 tablet (25 mg total) by mouth daily. 30 tablet 9   No current facility-administered medications on file prior to visit.    BP 138/70 mmHg  Pulse 65  Temp(Src) 97.8 F (36.6 C) (Oral)  Ht 5\' 9"  (1.753 m)  Wt 265 lb (120.203 kg)  BMI 39.12 kg/m2  SpO2 95%       Objective:   Physical Exam  Constitutional: He is oriented to person, place, and time. He appears well-developed and well-nourished. No distress.  Neck: Normal range of motion. Neck supple.  Cardiovascular: Normal rate, regular rhythm, normal heart sounds and intact distal pulses.  Exam reveals no gallop and no friction rub.   No murmur heard. Pulmonary/Chest: Effort normal. No respiratory distress. He has wheezes in the right upper field, the right middle field and the left upper field. He has no rales. He exhibits no tenderness.  Lymphadenopathy:    He has no cervical adenopathy.  Neurological: He is alert and oriented to person, place, and time.  Skin: Skin is warm and dry. No rash noted. He is not diaphoretic. No erythema. No pallor.  Psychiatric: He has a normal mood and affect. His behavior is normal. Judgment and thought content normal.  Nursing note and vitals reviewed.     Assessment & Plan:  1. Acute  bronchitis, unspecified organism - predniSONE (DELTASONE) 10 MG tablet; 40 mg x 3 days. 20 mg x 3 days, 10 mgx 3days.  Dispense: 21 tablet; Refill: 0 - doxycycline (VIBRAMYCIN) 100 MG capsule; Take 1 capsule (100 mg total) by mouth 2 (two) times daily.  Dispense: 14 capsule; Refill: 0 - Follow up if no improvement.

## 2015-12-30 ENCOUNTER — Other Ambulatory Visit: Payer: Medicare Other

## 2016-01-05 ENCOUNTER — Encounter: Payer: Medicare Other | Admitting: Adult Health

## 2016-01-06 ENCOUNTER — Other Ambulatory Visit: Payer: Self-pay | Admitting: Adult Health

## 2016-01-06 ENCOUNTER — Other Ambulatory Visit: Payer: Medicare Other

## 2016-01-06 DIAGNOSIS — I10 Essential (primary) hypertension: Secondary | ICD-10-CM

## 2016-01-07 ENCOUNTER — Other Ambulatory Visit (INDEPENDENT_AMBULATORY_CARE_PROVIDER_SITE_OTHER): Payer: Medicare Other

## 2016-01-07 DIAGNOSIS — Z0000 Encounter for general adult medical examination without abnormal findings: Secondary | ICD-10-CM

## 2016-01-07 DIAGNOSIS — E119 Type 2 diabetes mellitus without complications: Secondary | ICD-10-CM

## 2016-01-07 DIAGNOSIS — Z125 Encounter for screening for malignant neoplasm of prostate: Secondary | ICD-10-CM

## 2016-01-07 LAB — BASIC METABOLIC PANEL
BUN: 12 mg/dL (ref 6–23)
CALCIUM: 9 mg/dL (ref 8.4–10.5)
CO2: 27 meq/L (ref 19–32)
Chloride: 104 mEq/L (ref 96–112)
Creatinine, Ser: 0.99 mg/dL (ref 0.40–1.50)
GFR: 95.88 mL/min (ref >60.00)
GLUCOSE: 131 mg/dL — AB (ref 70–99)
POTASSIUM: 3.7 meq/L (ref 3.5–5.1)
SODIUM: 139 meq/L (ref 135–145)

## 2016-01-07 LAB — POC URINALSYSI DIPSTICK (AUTOMATED)
Bilirubin, UA: NEGATIVE
Blood, UA: NEGATIVE
GLUCOSE UA: NEGATIVE
Ketones, UA: NEGATIVE
Leukocytes, UA: NEGATIVE
NITRITE UA: NEGATIVE
Spec Grav, UA: 1.02
UROBILINOGEN UA: 2
pH, UA: 5.5

## 2016-01-07 LAB — LIPID PANEL
CHOLESTEROL: 180 mg/dL (ref 0–200)
HDL: 41.9 mg/dL (ref >39.00)
LDL Cholesterol: 124 mg/dL — ABNORMAL HIGH (ref 0–99)
NonHDL: 138.19
Total CHOL/HDL Ratio: 4
Triglycerides: 72 mg/dL (ref 0.0–149.0)
VLDL: 14.4 mg/dL (ref 0.0–40.0)

## 2016-01-07 LAB — CBC WITH DIFFERENTIAL/PLATELET
Basophils Absolute: 0 10*3/uL (ref 0.0–0.1)
Basophils Relative: 0.9 % (ref 0.0–3.0)
EOS PCT: 4.2 % (ref 0.0–5.0)
Eosinophils Absolute: 0.2 10*3/uL (ref 0.0–0.7)
HCT: 39.4 % (ref 39.0–52.0)
Hemoglobin: 13.6 g/dL (ref 13.0–17.0)
LYMPHS ABS: 2.4 10*3/uL (ref 0.7–4.0)
Lymphocytes Relative: 48.3 % — ABNORMAL HIGH (ref 12.0–46.0)
MCHC: 34.6 g/dL (ref 30.0–36.0)
MCV: 89.3 fl (ref 78.0–100.0)
MONOS PCT: 10.7 % (ref 3.0–12.0)
Monocytes Absolute: 0.5 10*3/uL (ref 0.1–1.0)
NEUTROS ABS: 1.8 10*3/uL (ref 1.4–7.7)
NEUTROS PCT: 35.9 % — AB (ref 43.0–77.0)
PLATELETS: 248 10*3/uL (ref 150.0–400.0)
RBC: 4.41 Mil/uL (ref 4.22–5.81)
RDW: 12.3 % (ref 11.5–15.5)
WBC: 5.1 10*3/uL (ref 4.0–10.5)

## 2016-01-07 LAB — HEMOGLOBIN A1C: HEMOGLOBIN A1C: 5.5 % (ref 4.6–6.5)

## 2016-01-07 LAB — HEPATIC FUNCTION PANEL
ALBUMIN: 4 g/dL (ref 3.5–5.2)
ALT: 12 U/L (ref 0–53)
AST: 13 U/L (ref 0–37)
Alkaline Phosphatase: 94 U/L (ref 39–117)
Bilirubin, Direct: 0.2 mg/dL (ref 0.0–0.3)
Total Bilirubin: 0.7 mg/dL (ref 0.2–1.2)
Total Protein: 7 g/dL (ref 6.0–8.3)

## 2016-01-07 LAB — PSA: PSA: 1.86 ng/mL (ref 0.10–4.00)

## 2016-01-07 LAB — TSH: TSH: 1.38 u[IU]/mL (ref 0.35–4.50)
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
## 2016-01-11 ENCOUNTER — Ambulatory Visit (INDEPENDENT_AMBULATORY_CARE_PROVIDER_SITE_OTHER): Payer: Medicare Other | Admitting: Adult Health

## 2016-01-11 ENCOUNTER — Encounter: Payer: Self-pay | Admitting: Adult Health

## 2016-01-11 VITALS — BP 160/64 | Temp 97.6°F | Ht 69.0 in | Wt 273.4 lb

## 2016-01-11 DIAGNOSIS — Z0000 Encounter for general adult medical examination without abnormal findings: Secondary | ICD-10-CM | POA: Diagnosis not present

## 2016-01-11 DIAGNOSIS — I10 Essential (primary) hypertension: Secondary | ICD-10-CM

## 2016-01-11 NOTE — Patient Instructions (Addendum)
It was great seeing you this morning.   Continue to exercise and eat healthy - your weight is going up. You are about 13 pounds heavier than from December 2016  Follow up with me in 1 year for your next complete physical exam   I would also like for you to sign up for an annual wellness visit on a Friday with our nurse Manuela Schwartz. This is a free benefit under medicare that may help [place] find additional ways to help you.    Health Maintenance, Male A healthy lifestyle and preventative care can promote health and wellness.  Maintain regular health, dental, and eye exams.  Eat a healthy diet. Foods like vegetables, fruits, whole grains, low-fat dairy products, and lean protein foods contain the nutrients you need and are low in calories. Decrease your intake of foods high in solid fats, added sugars, and salt. Get information about a proper diet from your health care provider, if necessary.  Regular physical exercise is one of the most important things you can do for your health. Most adults should get at least 150 minutes of moderate-intensity exercise (any activity that increases your heart rate and causes you to sweat) each week. In addition, most adults need muscle-strengthening exercises on 2 or more days a week.   Maintain a healthy weight. The body mass index (BMI) is a screening tool to identify possible weight problems. It provides an estimate of body fat based on height and weight. Your health care provider can find your BMI and can help you achieve or maintain a healthy weight. For males 20 years and older:  A BMI below 18.5 is considered underweight.  A BMI of 18.5 to 24.9 is normal.  A BMI of 25 to 29.9 is considered overweight.  A BMI of 30 and above is considered obese.  Maintain normal blood lipids and cholesterol by exercising and minimizing your intake of saturated fat. Eat a balanced diet with plenty of fruits and vegetables. Blood tests for lipids and cholesterol should begin  at age 17 and be repeated every 5 years. If your lipid or cholesterol levels are high, you are over age 26, or you are at high risk for heart disease, you may need your cholesterol levels checked more frequently.Ongoing high lipid and cholesterol levels should be treated with medicines if diet and exercise are not working.  If you smoke, find out from your health care provider how to quit. If you do not use tobacco, do not start.  Lung cancer screening is recommended for adults aged 59-80 years who are at high risk for developing lung cancer because of a history of smoking. A yearly low-dose CT scan of the lungs is recommended for people who have at least a 30-pack-year history of smoking and are current smokers or have quit within the past 15 years. A pack year of smoking is smoking an average of 1 pack of cigarettes a day for 1 year (for example, a 30-pack-year history of smoking could mean smoking 1 pack a day for 30 years or 2 packs a day for 15 years). Yearly screening should continue until the smoker has stopped smoking for at least 15 years. Yearly screening should be stopped for people who develop a health problem that would prevent them from having lung cancer treatment.  If you choose to drink alcohol, do not have more than 2 drinks per day. One drink is considered to be 12 oz (360 mL) of beer, 5 oz (150 mL) of  wine, or 1.5 oz (45 mL) of liquor.  Avoid the use of street drugs. Do not share needles with anyone. Ask for help if you need support or instructions about stopping the use of drugs.  High blood pressure causes heart disease and increases the risk of stroke. High blood pressure is more likely to develop in:  People who have blood pressure in the end of the normal range (100-139/85-89 mm Hg).  People who are overweight or obese.  People who are African American.  If you are 90-69 years of age, have your blood pressure checked every 3-5 years. If you are 27 years of age or older,  have your blood pressure checked every year. You should have your blood pressure measured twice--once when you are at a hospital or clinic, and once when you are not at a hospital or clinic. Record the average of the two measurements. To check your blood pressure when you are not at a hospital or clinic, you can use:  An automated blood pressure machine at a pharmacy.  A home blood pressure monitor.  If you are 84-45 years old, ask your health care provider if you should take aspirin to prevent heart disease.  Diabetes screening involves taking a blood sample to check your fasting blood sugar level. This should be done once every 3 years after age 84 if you are at a normal weight and without risk factors for diabetes. Testing should be considered at a younger age or be carried out more frequently if you are overweight and have at least 1 risk factor for diabetes.  Colorectal cancer can be detected and often prevented. Most routine colorectal cancer screening begins at the age of 12 and continues through age 61. However, your health care provider may recommend screening at an earlier age if you have risk factors for colon cancer. On a yearly basis, your health care provider may provide home test kits to check for hidden blood in the stool. A small camera at the end of a tube may be used to directly examine the colon (sigmoidoscopy or colonoscopy) to detect the earliest forms of colorectal cancer. Talk to your health care provider about this at age 62 when routine screening begins. A direct exam of the colon should be repeated every 5-10 years through age 55, unless early forms of precancerous polyps or small growths are found.  People who are at an increased risk for hepatitis B should be screened for this virus. You are considered at high risk for hepatitis B if:  You were born in a country where hepatitis B occurs often. Talk with your health care provider about which countries are considered high  risk.  Your parents were born in a high-risk country and you have not received a shot to protect against hepatitis B (hepatitis B vaccine).  You have HIV or AIDS.  You use needles to inject street drugs.  You live with, or have sex with, someone who has hepatitis B.  You are a man who has sex with other men (MSM).  You get hemodialysis treatment.  You take certain medicines for conditions like cancer, organ transplantation, and autoimmune conditions.  Hepatitis C blood testing is recommended for all people born from [birth year] through 1966 and any individual with known risk factors for hepatitis C.  Healthy men should no longer receive prostate-specific antigen (PSA) blood tests as part of routine cancer screening. Talk to your health care provider about prostate cancer screening.  Testicular cancer screening  is not recommended for adolescents or adult males who have no symptoms. Screening includes self-exam, a health care provider exam, and other screening tests. Consult with your health care provider about any symptoms you have or any concerns you have about testicular cancer.  Practice safe sex. Use condoms and avoid high-risk sexual practices to reduce the spread of sexually transmitted infections (STIs).  You should be screened for STIs, including gonorrhea and chlamydia if:  You are sexually active and are younger than 24 years.  You are older than 24 years, and your health care provider tells you that you are at risk for this type of infection.  Your sexual activity has changed since you were last screened, and you are at an increased risk for chlamydia or gonorrhea. Ask your health care provider if you are at risk.  If you are at risk of being infected with HIV, it is recommended that you take a prescription medicine daily to prevent HIV infection. This is called pre-exposure prophylaxis (PrEP). You are considered at risk if:  You are a man who has sex with other men (MSM).  You  are a heterosexual man who is sexually active with multiple partners.  You take drugs by injection.  You are sexually active with a partner who has HIV.  Talk with your health care provider about whether you are at high risk of being infected with HIV. If you choose to begin PrEP, you should first be tested for HIV. You should then be tested every 3 months for as long as you are taking PrEP.  Use sunscreen. Apply sunscreen liberally and repeatedly throughout the day. You should seek shade when your shadow is shorter than you. Protect yourself by wearing long sleeves, pants, a wide-brimmed hat, and sunglasses year round whenever you are outdoors.  Tell your health care provider of new moles or changes in moles, especially if there is a change in shape or color. Also, tell your health care provider if a mole is larger than the size of a pencil eraser.  A one-time screening for abdominal aortic aneurysm (AAA) and surgical repair of large AAAs by ultrasound is recommended for men aged 38-75 years who are current or former smokers.  Stay current with your vaccines (immunizations).   This information is not intended to replace advice given to you by your health care provider. Make sure you discuss any questions you have with your health care provider.   Document Released: 10/28/2007 Document Revised: 05/22/2014 Document Reviewed: 09/26/2010 Elsevier Interactive Patient Education [employer].

## 2016-01-11 NOTE — Progress Notes (Signed)
Subjective:    Patient ID: [name], male    DOB: [birth year], 70 y.o.   MRN: [id number]  HPI  Patient presents for yearly preventative medicine examination. He is a pleasant 70 year old male who  has a past medical history of Arthritis; Complication of anesthesia; Hypertension; and Neuromuscular disorder ([place]).  All immunizations and health maintenance protocols were reviewed with the patient and needed orders were placed.  Medication reconciliation,  past medical history, social history, problem list and allergies were reviewed in detail with the patient  Goals were established with regard to weight loss, exercise, and  diet in compliance with medications  He is up to date on his colonoscopy, dental and eye exams.   Cognitive function normal he walks on a regular basis home health safety reviewed no issues identified, no guns in the house, he does have a health care power of attorney and living well. He is going to the [facility] for three days a week and is eating healthy.  He takes 10 mg of Norvasc along with hydrochlorothiazide 25 mg daily and Cozaar 25mg  daily.Denies any side effects to these medications. He monitors his blood pressures at home and endorses that they are running in the 130's-140's. He took his BP medication just prior to arrival this morning  Review of Systems  Constitutional: Negative.   HENT: Negative.   Eyes: Negative.   Respiratory: Negative.   Cardiovascular: Negative.   Gastrointestinal: Negative.   Endocrine: Negative.   Genitourinary: Negative.   Musculoskeletal: Negative.   Skin: Negative.   Allergic/Immunologic: Negative.   Neurological: Negative.   Hematological: Negative.   All other systems reviewed and are negative.  Past Medical History:  Diagnosis Date  . Arthritis   . Complication of anesthesia   . Hypertension   . Neuromuscular disorder ([place])    pinched nerve in foot    Social History   Social History  . Marital status:  Married    Spouse name: N/A  . Number of children: N/A  . Years of education: N/A   Occupational History  . Not on file.   Social History Main Topics  . Smoking status: Former Research scientist (life sciences)  . Smokeless tobacco: Not on file  . Alcohol use Yes     Comment: holidays  . Drug use: No  . Sexual activity: Not on file     Comment: quit 40 yrs ago   Other Topics Concern  . Not on file   Social History Narrative   Retired for an Research scientist (physical sciences) for a [employer]   Three children who live in [place]       Past Surgical History:  Procedure Laterality Date  . CATARACT EXTRACTION Right   . JOINT REPLACEMENT     bilateral knee replacenent  . TOTAL KNEE REVISION  05/30/2011   Procedure: TOTAL KNEE REVISION;  Surgeon: Meredith Pel, MD;  Location: [facility];  Service: Orthopedics;  Laterality: Right;  Right total knee arthroplasty polyethylene spacer exchange    Family History  Problem Relation Age of Onset  . Diabetes Sister   . Stroke Sister     Allergies  Allergen Reactions  . Lisinopril Cough    Current Outpatient Prescriptions on File Prior to Visit  Medication Sig Dispense Refill  . amLODipine (NORVASC) 10 MG tablet TAKE 1 TABLET BY MOUTH EVERY DAY 100 tablet 3  . aspirin 81 MG tablet Take 81 mg by mouth daily.    . fluocinonide cream (LIDEX)  0.05 % Apply topically 2 (two) times daily. (Patient taking differently: Apply 1 application topically daily as needed (FOR FEET). ) 60 g 3  . hydrochlorothiazide (HYDRODIURIL) 25 MG tablet TAKE 1 TABLET BY MOUTH DAILY. 100 tablet 3  . losartan (COZAAR) 25 MG tablet Take 1 tablet (25 mg total) by mouth daily. 30 tablet 9   No current facility-administered medications on file prior to visit.     BP (!) 160/64   Temp 97.6 F (36.4 C) (Oral)   Ht 5\' 9"  (1.753 m)   Wt 273 lb 6.4 oz (124 kg)   BMI 40.37 kg/m       Objective:   Physical Exam  Constitutional: He is oriented to person, place, and time. He appears  well-developed and well-nourished. No distress.  HENT:  Head: Normocephalic and atraumatic.  Right Ear: External ear normal.  Left Ear: External ear normal.  Nose: Nose normal.  Mouth/Throat: Oropharynx is clear and moist. No oropharyngeal exudate.  Eyes: Conjunctivae are normal. Pupils are equal, round, and reactive to light. Right eye exhibits no discharge. Left eye exhibits no discharge.  Neck: Trachea normal and normal range of motion. Neck supple. No JVD present. Carotid bruit is not present. No tracheal deviation present. No thyroid mass and no thyromegaly present.  Cardiovascular: Normal rate, regular rhythm, normal heart sounds and intact distal pulses.  Exam reveals no gallop and no friction rub.   No murmur heard. Pulmonary/Chest: Effort normal and breath sounds normal. No stridor. No respiratory distress. He has no wheezes. He has no rales. He exhibits no tenderness.  Abdominal: Soft. Bowel sounds are normal. He exhibits no distension. There is no tenderness. There is no rebound and no guarding.  Musculoskeletal: Normal range of motion.  Lymphadenopathy:    He has no cervical adenopathy.  Neurological: He is alert and oriented to person, place, and time. He has normal reflexes. No cranial nerve deficit. Coordination normal.  Skin: Skin is warm and dry. No rash noted. He is not diaphoretic. No erythema. No pallor.  Psychiatric: He has a normal mood and affect. His behavior is normal. Judgment and thought content normal.  Nursing note and vitals reviewed.     Assessment & Plan:  1. Routine general medical examination at a health care facility - Reviewed labs in detail with patient. All questions answered - Encouraged a heart healthy diet and regular aerobic exercise.  - I will would like him to increase his aerobic exercise and work on portion control to lose weight. He is going to follow up with me in three months   2. Essential hypertension - Not at goal. Likely due to  increase in weight - EKG 12-Lead- NSR, rate 67  - Will not change medication at this time as he is determined to get weight off  [name], NP

## 2016-01-23 ENCOUNTER — Other Ambulatory Visit: Payer: Self-pay | Admitting: Family Medicine

## 2016-03-02 ENCOUNTER — Ambulatory Visit (INDEPENDENT_AMBULATORY_CARE_PROVIDER_SITE_OTHER): Payer: Medicare Other | Admitting: Adult Health

## 2016-03-02 ENCOUNTER — Encounter: Payer: Self-pay | Admitting: Adult Health

## 2016-03-02 VITALS — BP 132/62 | Temp 98.5°F | Ht 69.0 in | Wt 271.5 lb

## 2016-03-02 DIAGNOSIS — R05 Cough: Secondary | ICD-10-CM

## 2016-03-02 DIAGNOSIS — S161XXA Strain of muscle, fascia and tendon at neck level, initial encounter: Secondary | ICD-10-CM | POA: Diagnosis not present

## 2016-03-02 DIAGNOSIS — R059 Cough, unspecified: Secondary | ICD-10-CM

## 2016-03-02 MED ORDER — CYCLOBENZAPRINE HCL 10 MG PO TABS: 10.0000 mg | ORAL_TABLET | Freq: Every day | ORAL | 0 refills | Status: DC

## 2016-03-02 MED ORDER — METHYLPREDNISOLONE 4 MG PO TBPK: ORAL_TABLET | ORAL | 0 refills | Status: DC

## 2016-03-02 NOTE — Progress Notes (Signed)
Subjective:    Patient ID: [name], male    DOB: [birth year], 70 y.o.   MRN: [id number]  HPI  70 year old male who presents to the office today for two separate complaints1  1. For the last 3 days while he has been experiencing neck pain. He reports that he first started experiencing this neck pain when he woke up after falling asleep in his recliner. The pain is described as "dull" can sometimes be sharp when he tries to turn his head in certain directions. He does feel like he has loss of range of motion with horizontal movement. His neck pain he believes is causing him to have a headache. When he places a warm moist towel around his neck the pain and range of motion improved. He has not been taking anything over-the-counter for his discomfort  2 also reporting constant dry cough for the last 4 days. He does have a history of bronchitis in the past and is unsure if her symptoms are the same. He denies any fevers, sinus pain or pressure, runny nose, or feeling acutely ill. He has not been taking any medication over-the-counter for this issue.   Review of Systems  Constitutional: Positive for activity change. Negative for chills, diaphoresis, fatigue and fever.  Eyes: Negative.   Respiratory: Positive for cough. Negative for apnea, shortness of breath and wheezing.   Cardiovascular: Negative.   Musculoskeletal: Positive for myalgias and neck stiffness. Negative for neck pain.  Skin: Negative.   Neurological: Positive for headaches. Negative for dizziness, seizures, facial asymmetry, weakness and numbness.  All other systems reviewed and are negative.  Past Medical History:  Diagnosis Date  . Arthritis   . Complication of anesthesia   . Hypertension   . Neuromuscular disorder ([place])    pinched nerve in foot    Social History   Social History  . Marital status: Married    Spouse name: N/A  . Number of children: N/A  . Years of education: N/A   Occupational History  .  Not on file.   Social History Main Topics  . Smoking status: Former Research scientist (life sciences)  . Smokeless tobacco: Not on file  . Alcohol use Yes     Comment: holidays  . Drug use: No  . Sexual activity: Not on file     Comment: quit 40 yrs ago   Other Topics Concern  . Not on file   Social History Narrative   Retired for an Research scientist (physical sciences) for a [employer]   Three children who live in [place]       Past Surgical History:  Procedure Laterality Date  . CATARACT EXTRACTION Right   . JOINT REPLACEMENT     bilateral knee replacenent  . TOTAL KNEE REVISION  05/30/2011   Procedure: TOTAL KNEE REVISION;  Surgeon: Meredith Pel, MD;  Location: [facility];  Service: Orthopedics;  Laterality: Right;  Right total knee arthroplasty polyethylene spacer exchange    Family History  Problem Relation Age of Onset  . Diabetes Sister   . Stroke Sister     Allergies  Allergen Reactions  . Lisinopril Cough    Current Outpatient Prescriptions on File Prior to Visit  Medication Sig Dispense Refill  . amLODipine (NORVASC) 10 MG tablet TAKE 1 TABLET BY MOUTH EVERY DAY 100 tablet 2  . aspirin 81 MG tablet Take 81 mg by mouth daily.    . hydrochlorothiazide (HYDRODIURIL) 25 MG tablet TAKE 1 TABLET BY MOUTH DAILY.  100 tablet 2  . losartan (COZAAR) 25 MG tablet Take 1 tablet (25 mg total) by mouth daily. 30 tablet 9   No current facility-administered medications on file prior to visit.     BP 132/62   Temp 98.5 F (36.9 C) (Oral)   Ht 5\' 9"  (1.753 m)   Wt 271 lb 8 oz (123.2 kg)   BMI 40.09 kg/m       Objective:   Physical Exam  Constitutional: He is oriented to person, place, and time. He appears well-developed and well-nourished. No distress.  HENT:  Head: Normocephalic and atraumatic.  Right Ear: External ear normal.  Left Ear: External ear normal.  Nose: Nose normal.  Mouth/Throat: Oropharynx is clear and moist. No oropharyngeal exudate.  Cardiovascular: Normal rate, regular  rhythm, normal heart sounds and intact distal pulses.  Exam reveals no gallop and no friction rub.   No murmur heard. Pulmonary/Chest: Effort normal and breath sounds normal. No respiratory distress. He has no wheezes. He has no rales. He exhibits no tenderness.  Musculoskeletal: He exhibits tenderness. He exhibits no edema or deformity.  Tenderness with palpation to back of neck. Appears to be more muscle pain in nature. No spinal tenderness. Decreased range of motion with horizontal and vertical movement  Neurological: He is alert and oriented to person, place, and time.  Skin: Skin is warm and dry. No rash noted. He is not diaphoretic. No erythema. No pallor.  Psychiatric: He has a normal mood and affect. His behavior is normal. Judgment and thought content normal.  Nursing note and vitals reviewed.     Assessment & Plan:  1. Acute strain of neck muscle, initial encounter - Pain appears to be from muscle strain and neck. Will treat with Flexeril and a Medrol Dosepak - cyclobenzaprine (FLEXERIL) 10 MG tablet; Take 1 tablet (10 mg total) by mouth at bedtime.  Dispense: 30 tablet; Refill: 0 - methylPREDNISolone (MEDROL DOSEPAK) 4 MG TBPK tablet; Take as directed  Dispense: 21 tablet; Refill: 0 - Also advised to take Motrin 800 mg every 8 hours for the next 3 days he can continue to use a warm compress - Follow-up if no improvement 2 or 3 days 2. Cough - No concern for pneumonia or infectious process. [place] viral or seasonal allergy in nature. My hope is that the Medrol Dosepak will help relieve his coughing symptoms - methylPREDNISolone (MEDROL DOSEPAK) 4 MG TBPK tablet; Take as directed  Dispense: 21 tablet; Refill: 0 - Can use over-the-counter Mucinex - I'll up if no improvement or if symptoms become worse  [name], NP

## 2016-03-02 NOTE — Patient Instructions (Signed)
It was great seeing you today!  I have sent in a prescription for Flexeril and Prednisone. The flexeril is a muscle relaxer and will help with the neck pain. The prednisone will help with the cough and neck pain   You can also use Mucinex  Take Motrin 600mg  every 8 hours for the next 3 days.   Follow up if no improvement

## 2016-04-13 ENCOUNTER — Encounter: Payer: Self-pay | Admitting: Adult Health

## 2016-04-13 ENCOUNTER — Ambulatory Visit (INDEPENDENT_AMBULATORY_CARE_PROVIDER_SITE_OTHER): Payer: Medicare Other | Admitting: Adult Health

## 2016-04-13 DIAGNOSIS — R05 Cough: Secondary | ICD-10-CM | POA: Diagnosis not present

## 2016-04-13 DIAGNOSIS — R059 Cough, unspecified: Secondary | ICD-10-CM

## 2016-04-13 MED ORDER — BUPROPION HCL ER (SR) 150 MG PO TB12: 150.0000 mg | ORAL_TABLET | Freq: Two times a day (BID) | ORAL | 3 refills | Status: DC

## 2016-04-13 NOTE — Progress Notes (Signed)
Subjective:    Patient ID: [name], male    DOB: 11/22/1945, 70 y.o.   MRN: [id number]  HPI  70 year old male who  has a past medical history of Arthritis; Complication of anesthesia; Hypertension; and Neuromuscular disorder ([place]).   He presents to the office today to discuss weight loss options. He reports that he is going to the gym and working out 3-4 times a week for at least an hour. He is walking for a mile and then doing weight training. His diet has been pretty healthy but he did over indulge during the holiday.   Wt Readings from Last 3 Encounters:  04/13/16 276 lb (125.2 kg)  03/02/16 271 lb 8 oz (123.2 kg)  01/11/16 273 lb 6.4 oz (124 kg)   He is also complaining of a constant dry cough that comes and goes. He reports that he does not have any issues with GERD symptoms and has not been waking up with a sour taste in his mouth. The cough lasts about 2 days and then goes away for a few weeks. He denies any fevers or feeling ill.   Review of Systems  Constitutional: Negative.   Respiratory: Positive for cough.   Cardiovascular: Negative.   Gastrointestinal: Negative.   Endocrine: Negative.   Musculoskeletal: Negative.   All other systems reviewed and are negative.  Past Medical History:  Diagnosis Date  . Arthritis   . Complication of anesthesia   . Hypertension   . Neuromuscular disorder ([place])    pinched nerve in foot    Social History   Social History  . Marital status: Married    Spouse name: N/A  . Number of children: N/A  . Years of education: N/A   Occupational History  . Not on file.   Social History Main Topics  . Smoking status: Former Research scientist (life sciences)  . Smokeless tobacco: Not on file  . Alcohol use Yes     Comment: holidays  . Drug use: No  . Sexual activity: Not on file     Comment: quit 40 yrs ago   Other Topics Concern  . Not on file   Social History Narrative   Retired for an Research scientist (physical sciences) for a [employer]   Three  children who live in [place]       Past Surgical History:  Procedure Laterality Date  . CATARACT EXTRACTION Right   . JOINT REPLACEMENT     bilateral knee replacenent  . TOTAL KNEE REVISION  05/30/2011   Procedure: TOTAL KNEE REVISION;  Surgeon: Meredith Pel, MD;  Location: [facility];  Service: Orthopedics;  Laterality: Right;  Right total knee arthroplasty polyethylene spacer exchange    Family History  Problem Relation Age of Onset  . Diabetes Sister   . Stroke Sister     Allergies  Allergen Reactions  . Lisinopril Cough    Current Outpatient Prescriptions on File Prior to Visit  Medication Sig Dispense Refill  . amLODipine (NORVASC) 10 MG tablet TAKE 1 TABLET BY MOUTH EVERY DAY 100 tablet 2  . aspirin 81 MG tablet Take 81 mg by mouth daily.    . cyclobenzaprine (FLEXERIL) 10 MG tablet Take 1 tablet (10 mg total) by mouth at bedtime. 30 tablet 0  . hydrochlorothiazide (HYDRODIURIL) 25 MG tablet TAKE 1 TABLET BY MOUTH DAILY. 100 tablet 2  . losartan (COZAAR) 25 MG tablet Take 1 tablet (25 mg total) by mouth daily. 30 tablet 9   No  current facility-administered medications on file prior to visit.     BP 124/60   Temp 98.6 F (37 C) (Oral)   Wt 276 lb (125.2 kg)   BMI 40.76 kg/m       Objective:   Physical Exam  Constitutional: He is oriented to person, place, and time. He appears well-developed and well-nourished. No distress.  HENT:  Mouth/Throat: Oropharynx is clear and moist. No oropharyngeal exudate.  Neck: Normal range of motion. Neck supple.  Cardiovascular: Normal rate, regular rhythm, normal heart sounds and intact distal pulses.  Exam reveals no gallop and no friction rub.   No murmur heard. Pulmonary/Chest: Effort normal and breath sounds normal. No respiratory distress. He has no wheezes. He has no rales. He exhibits no tenderness.  Lymphadenopathy:    He has no cervical adenopathy.  Neurological: He is alert and oriented to person, place, and  time.  Skin: Skin is warm and dry. No rash noted. He is not diaphoretic. No erythema. No pallor.  Psychiatric: He has a normal mood and affect. His behavior is normal. Judgment and thought content normal.  Nursing note and vitals reviewed.     Assessment & Plan:  1. Morbid obesity ([place]) - Will trial wellbutrin for weight loss. He will follow up in 3 months. Encouraged increaed aerobic exercise and decreased portion sizes. Increase water intake - buPROPion (WELLBUTRIN SR) 150 MG 12 hr tablet; Take 1 tablet (150 mg total) by mouth 2 (two) times daily.  Dispense: 60 tablet; Refill: 3  2. Cough - Will trial OTC prilosec.  - Follow up in three months  [name], NP

## 2016-04-13 NOTE — Patient Instructions (Addendum)
I have prescribed Wellbutrin to help with weight loss. Take this twice a day. Follow up with me in three months  I want you to take over the counter prilosec to see if that helps with the dry cough.   Calorie Counting for Weight Loss Calories are energy you get from the things you eat and drink. Your body uses this energy to keep you going throughout the day. The number of calories you eat affects your weight. When you eat more calories than your body needs, your body stores the extra calories as fat. When you eat fewer calories than your body needs, your body burns fat to get the energy it needs. Calorie counting means keeping track of how many calories you eat and drink each day. If you make sure to eat fewer calories than your body needs, you should lose weight. In order for calorie counting to work, you will need to eat the number of calories that are right for you in a day to lose a healthy amount of weight per week. A healthy amount of weight to lose per week is usually 1-2 lb (0.5-0.9 kg). A dietitian can determine how many calories you need in a day and give you suggestions on how to reach your calorie goal.  WHAT IS MY MY PLAN? My goal is to have __________ calories per day.  If I have this many calories per day, I should lose around __________ pounds per week. WHAT DO I NEED TO KNOW ABOUT CALORIE COUNTING? In order to meet your daily calorie goal, you will need to:  Find out how many calories are in each food you would like to eat. Try to do this before you eat.  Decide how much of the food you can eat.  Write down what you ate and how many calories it had. Doing this is called keeping a food log. WHERE DO I FIND CALORIE INFORMATION? The number of calories in a food can be found on a Nutrition Facts label. Note that all the information on a label is based on a specific serving of the food. If a food does not have a Nutrition Facts label, try to look up the calories online or ask your  dietitian for help. HOW DO I DECIDE HOW MUCH TO EAT? To decide how much of the food you can eat, you will need to consider both the number of calories in one serving and the size of one serving. This information can be found on the Nutrition Facts label. If a food does not have a Nutrition Facts label, look up the information online or ask your dietitian for help. Remember that calories are listed per serving. If you choose to have more than one serving of a food, you will have to multiply the calories per serving by the amount of servings you plan to eat. For example, the label on a package of bread might say that a serving size is 1 slice and that there are 90 calories in a serving. If you eat 1 slice, you will have eaten 90 calories. If you eat 2 slices, you will have eaten 180 calories. HOW DO I KEEP A FOOD LOG? After each meal, record the following information in your food log:  What you ate.  How much of it you ate.  How many calories it had.  Then, add up your calories. Keep your food log near you, such as in a small notebook in your pocket. Another option is to use  a mobile app or website. Some programs will calculate calories for you and show you how many calories you have left each time you add an item to the log. WHAT ARE SOME CALORIE COUNTING TIPS?  Use your calories on foods and drinks that will fill you up and not leave you hungry. Some examples of this include foods like nuts and nut butters, vegetables, lean proteins, and high-fiber foods (more than 5 g fiber per serving).  Eat nutritious foods and avoid empty calories. Empty calories are calories you get from foods or beverages that do not have many nutrients, such as candy and soda. It is better to have a nutritious high-calorie food (such as an avocado) than a food with few nutrients (such as a bag of chips).  Know how many calories are in the foods you eat most often. This way, you do not have to look up how many calories  they have each time you eat them.  Look out for foods that may seem like low-calorie foods but are really high-calorie foods, such as baked goods, soda, and fat-free candy.  Pay attention to calories in drinks. Drinks such as sodas, specialty coffee drinks, alcohol, and juices have a lot of calories yet do not fill you up. Choose low-calorie drinks like water and diet drinks.  Focus your calorie counting efforts on higher calorie items. Logging the calories in a garden salad that contains only vegetables is less important than calculating the calories in a milk shake.  Find a way of tracking calories that works for you. Get creative. Most people who are successful find ways to keep track of how much they eat in a day, even if they do not count every calorie. WHAT ARE SOME PORTION CONTROL TIPS?  Know how many calories are in a serving. This will help you know how many servings of a certain food you can have.  Use a measuring cup to measure serving sizes. This is helpful when you start out. With time, you will be able to estimate serving sizes for some foods.  Take some time to put servings of different foods on your favorite plates, bowls, and cups so you know what a serving looks like.  Try not to eat straight from a bag or box. Doing this can lead to overeating. Put the amount you would like to eat in a cup or on a plate to make sure you are eating the right portion.  Use smaller plates, glasses, and bowls to prevent overeating. This is a quick and easy way to practice portion control. If your plate is smaller, less food can fit on it.  Try not to multitask while eating, such as watching TV or using your computer. If it is time to eat, sit down at a table and enjoy your food. Doing this will help you to start recognizing when you are full. It will also make you more aware of what and how much you are eating. HOW CAN I CALORIE COUNT WHEN EATING OUT?  Ask for smaller portion sizes or  child-sized portions.  Consider sharing an entree and sides instead of getting your own entree.  If you get your own entree, eat only half. Ask for a box at the beginning of your meal and put the rest of your entree in it so you are not tempted to eat it.  Look for the calories on the menu. If calories are listed, choose the lower calorie options.  Choose dishes that include  vegetables, fruits, whole grains, low-fat dairy products, and lean protein. Focusing on smart food choices from each of the 5 food groups can help you stay on track at restaurants.  Choose items that are boiled, broiled, grilled, or steamed.  Choose water, milk, unsweetened iced tea, or other drinks without added sugars. If you want an alcoholic beverage, choose a lower calorie option. For example, a regular margarita can have up to 700 calories and a glass of wine has around 150.  Stay away from items that are buttered, battered, fried, or served with cream sauce. Items labeled "crispy" are usually fried, unless stated otherwise.  Ask for dressings, sauces, and syrups on the side. These are usually very high in calories, so do not eat much of them.  Watch out for salads. Many people think salads are a healthy option, but this is often not the case. Many salads come with bacon, fried chicken, lots of cheese, fried chips, and dressing. All of these items have a lot of calories. If you want a salad, choose a garden salad and ask for grilled meats or steak. Ask for the dressing on the side, or ask for olive oil and vinegar or lemon to use as dressing.  Estimate how many servings of a food you are given. For example, a serving of cooked rice is  cup or about the size of half a tennis ball or one cupcake wrapper. Knowing serving sizes will help you be aware of how much food you are eating at restaurants. The list below tells you how big or small some common portion sizes are based on everyday objects.  1 oz-4 stacked dice.  3  oz-1 deck of cards.  1 tsp-1 dice.  1 Tbsp- a Ping-Pong ball.  2 Tbsp-1 Ping-Pong ball.   cup-1 tennis ball or 1 cupcake wrapper.  1 cup-1 baseball. This information is not intended to replace advice given to you by your health care provider. Make sure you discuss any questions you have with your health care provider. Document Released: 05/01/2005 Document Revised: 05/22/2014 Document Reviewed: 03/06/2013 Elsevier Interactive Patient Education  2017 [employer].

## 2016-05-18 ENCOUNTER — Ambulatory Visit (INDEPENDENT_AMBULATORY_CARE_PROVIDER_SITE_OTHER): Payer: Medicare Other | Admitting: Adult Health

## 2016-05-18 ENCOUNTER — Encounter: Payer: Self-pay | Admitting: Adult Health

## 2016-05-18 VITALS — BP 146/70 | Temp 97.3°F | Ht 69.0 in | Wt 279.5 lb

## 2016-05-18 DIAGNOSIS — Z23 Encounter for immunization: Secondary | ICD-10-CM

## 2016-05-18 DIAGNOSIS — R059 Cough, unspecified: Secondary | ICD-10-CM

## 2016-05-18 DIAGNOSIS — R05 Cough: Secondary | ICD-10-CM | POA: Diagnosis not present

## 2016-05-18 MED ORDER — HYDROCODONE-HOMATROPINE 5-1.5 MG/5ML PO SYRP: 5.0000 mL | ORAL_SOLUTION | Freq: Three times a day (TID) | ORAL | 0 refills | Status: DC | PRN

## 2016-05-18 MED ORDER — BENZONATATE 100 MG PO CAPS: 100.0000 mg | ORAL_CAPSULE | Freq: Two times a day (BID) | ORAL | 0 refills | Status: DC | PRN

## 2016-05-18 MED ORDER — PREDNISONE 10 MG PO TABS: ORAL_TABLET | ORAL | 0 refills | Status: DC

## 2016-05-18 NOTE — Progress Notes (Signed)
Subjective:    Patient ID: [name], male    DOB: December 17, 1945, 71 y.o.   MRN: [id number]  Cough  This is a new problem. The current episode started 1 to 4 weeks ago. The problem has been unchanged. The problem occurs constantly. The cough is productive of sputum. Pertinent negatives include no chills, ear congestion, ear pain, fever, nasal congestion, postnasal drip, rhinorrhea, sore throat, shortness of breath, sweats or wheezing. The symptoms are aggravated by lying down. Treatments tried: mucinex. The treatment provided no relief.        Review of Systems  Constitutional: Negative for chills, fatigue and fever.  HENT: Negative for congestion, ear pain, postnasal drip, rhinorrhea, sinus pain, sore throat and trouble swallowing.   Respiratory: Positive for cough. Negative for chest tightness, shortness of breath and wheezing.   Cardiovascular: Negative.    Past Medical History:  Diagnosis Date  . Arthritis   . Complication of anesthesia   . Hypertension   . Neuromuscular disorder ([place])    pinched nerve in foot    Social History   Social History  . Marital status: Married    Spouse name: N/A  . Number of children: N/A  . Years of education: N/A   Occupational History  . Not on file.   Social History Main Topics  . Smoking status: Former Research scientist (life sciences)  . Smokeless tobacco: Not on file  . Alcohol use Yes     Comment: holidays  . Drug use: No  . Sexual activity: Not on file     Comment: quit 40 yrs ago   Other Topics Concern  . Not on file   Social History Narrative   Retired for an Research scientist (physical sciences) for a [employer]   Three children who live in [place]       Past Surgical History:  Procedure Laterality Date  . CATARACT EXTRACTION Right   . JOINT REPLACEMENT     bilateral knee replacenent  . TOTAL KNEE REVISION  05/30/2011   Procedure: TOTAL KNEE REVISION;  Surgeon: Meredith Pel, MD;  Location: [facility];  Service: Orthopedics;  Laterality:  Right;  Right total knee arthroplasty polyethylene spacer exchange    Family History  Problem Relation Age of Onset  . Diabetes Sister   . Stroke Sister     Allergies  Allergen Reactions  . Lisinopril Cough    Current Outpatient Prescriptions on File Prior to Visit  Medication Sig Dispense Refill  . amLODipine (NORVASC) 10 MG tablet TAKE 1 TABLET BY MOUTH EVERY DAY 100 tablet 2  . aspirin 81 MG tablet Take 81 mg by mouth daily.    Marland Kitchen buPROPion (WELLBUTRIN SR) 150 MG 12 hr tablet Take 1 tablet (150 mg total) by mouth 2 (two) times daily. 60 tablet 3  . cyclobenzaprine (FLEXERIL) 10 MG tablet Take 1 tablet (10 mg total) by mouth at bedtime. 30 tablet 0  . hydrochlorothiazide (HYDRODIURIL) 25 MG tablet TAKE 1 TABLET BY MOUTH DAILY. 100 tablet 2  . losartan (COZAAR) 25 MG tablet Take 1 tablet (25 mg total) by mouth daily. 30 tablet 9   No current facility-administered medications on file prior to visit.     BP (!) 146/70   Temp 97.3 F (36.3 C) (Oral)   Ht 5\' 9"  (1.753 m)   Wt 279 lb 8 oz (126.8 kg)   BMI 41.27 kg/m       Objective:   Physical Exam  Constitutional: He is oriented to person,  place, and time. He appears well-developed and well-nourished. No distress.  HENT:  Head: Normocephalic and atraumatic.  Right Ear: External ear normal.  Left Ear: External ear normal.  Nose: Nose normal.  Mouth/Throat: Oropharynx is clear and moist. No oropharyngeal exudate.  Eyes: Conjunctivae and EOM are normal. Pupils are equal, round, and reactive to light. Right eye exhibits no discharge. Left eye exhibits no discharge. No scleral icterus.  Neck: Normal range of motion. Neck supple. No JVD present. No tracheal deviation present. No thyromegaly present.  Cardiovascular: Normal rate, regular rhythm and normal heart sounds.  Exam reveals no gallop and no friction rub.   No murmur heard. Pulmonary/Chest: Effort normal and breath sounds normal. No stridor. No respiratory distress. He  has no wheezes. He has no rales. He exhibits no tenderness.  Lymphadenopathy:    He has no cervical adenopathy.  Neurological: He is alert and oriented to person, place, and time.  Skin: Skin is warm and dry. No rash noted. He is not diaphoretic. No erythema. No pallor.  Psychiatric: He has a normal mood and affect. His behavior is normal. Judgment and thought content normal.  Nursing note and vitals reviewed.     Assessment & Plan:  1. Cough - No concern for pneumonia. Likely bronchitis or other viral cough syndrome  - HYDROcodone-homatropine (HYCODAN) 5-1.5 MG/5ML syrup; Take 5 mLs by mouth every 8 (eight) hours as needed for cough.  Dispense: 120 mL; Refill: 0 - predniSONE (DELTASONE) 10 MG tablet; 40 mg x 3 days, 20 mg x 3 days, 10 mg x 3 days  Dispense: 21 tablet; Refill: 0 - benzonatate (TESSALON) 100 MG capsule; Take 1 capsule (100 mg total) by mouth 2 (two) times daily as needed for cough.  Dispense: 20 capsule; Refill: 0 - Follow up as needed  [name], NP

## 2016-05-29 ENCOUNTER — Encounter: Payer: Self-pay | Admitting: Gastroenterology

## 2016-07-12 ENCOUNTER — Ambulatory Visit (INDEPENDENT_AMBULATORY_CARE_PROVIDER_SITE_OTHER): Payer: Medicare Other | Admitting: Adult Health

## 2016-07-12 VITALS — BP 160/82 | Temp 97.9°F | Ht 69.0 in | Wt 273.6 lb

## 2016-07-12 DIAGNOSIS — E6609 Other obesity due to excess calories: Secondary | ICD-10-CM | POA: Diagnosis not present

## 2016-07-12 DIAGNOSIS — I10 Essential (primary) hypertension: Secondary | ICD-10-CM | POA: Diagnosis not present

## 2016-07-12 DIAGNOSIS — K219 Gastro-esophageal reflux disease without esophagitis: Secondary | ICD-10-CM | POA: Diagnosis not present

## 2016-07-12 DIAGNOSIS — Z6841 Body Mass Index (BMI) 40.0 and over, adult: Secondary | ICD-10-CM

## 2016-07-12 DIAGNOSIS — IMO0001 Reserved for inherently not codable concepts without codable children: Secondary | ICD-10-CM

## 2016-07-12 MED ORDER — OMEPRAZOLE 40 MG PO CPDR: 40.0000 mg | DELAYED_RELEASE_CAPSULE | Freq: Every day | ORAL | 3 refills | Status: DC

## 2016-07-12 MED ORDER — LOSARTAN POTASSIUM 50 MG PO TABS: 50.0000 mg | ORAL_TABLET | Freq: Every day | ORAL | 1 refills | Status: DC

## 2016-07-12 NOTE — Progress Notes (Signed)
Subjective:    Patient ID: [name], male    DOB: [birth year], 71 y.o.   MRN: [id number]  HPI   71 year old male who  has a past medical history of Arthritis; Complication of anesthesia; Hypertension; and Neuromuscular disorder ([place]). He presents to the office today for three month follow up regarding weight loss. He was started on Wellbutrin 150mg . He reports that he is at the [facility] three days per week. He has cut back on carbs and salt. Has increased fruits and vegetables.    Wt Readings from Last 3 Encounters:  07/12/16 273 lb 9.6 oz (124.1 kg)  05/18/16 279 lb 8 oz (126.8 kg)  04/13/16 276 lb (125.2 kg)   Since he has been using OTC prilsec his cough has diminished   His blood pressure is not controlled on Cozaar 25mg . He denies any headaches or blurred vision.    Review of Systems Negative unless mentioned above.  Past Medical History:  Diagnosis Date  . Arthritis   . Complication of anesthesia   . Hypertension   . Neuromuscular disorder ([place])    pinched nerve in foot    Social History   Social History  . Marital status: Married    Spouse name: N/A  . Number of children: N/A  . Years of education: N/A   Occupational History  . Not on file.   Social History Main Topics  . Smoking status: Former Research scientist (life sciences)  . Smokeless tobacco: Not on file  . Alcohol use Yes     Comment: holidays  . Drug use: No  . Sexual activity: Not on file     Comment: quit 40 yrs ago   Other Topics Concern  . Not on file   Social History Narrative   Retired for an Research scientist (physical sciences) for a [employer]   Three children who live in [place]       Past Surgical History:  Procedure Laterality Date  . CATARACT EXTRACTION Right   . JOINT REPLACEMENT     bilateral knee replacenent  . TOTAL KNEE REVISION  05/30/2011   Procedure: TOTAL KNEE REVISION;  Surgeon: Meredith Pel, MD;  Location: [facility];  Service: Orthopedics;  Laterality: Right;  Right total knee arthroplasty  polyethylene spacer exchange    Family History  Problem Relation Age of Onset  . Diabetes Sister   . Stroke Sister     Allergies  Allergen Reactions  . Lisinopril Cough    Current Outpatient Prescriptions on File Prior to Visit  Medication Sig Dispense Refill  . amLODipine (NORVASC) 10 MG tablet TAKE 1 TABLET BY MOUTH EVERY DAY 100 tablet 2  . aspirin 81 MG tablet Take 81 mg by mouth daily.    Marland Kitchen buPROPion (WELLBUTRIN SR) 150 MG 12 hr tablet Take 1 tablet (150 mg total) by mouth 2 (two) times daily. 60 tablet 3  . cyclobenzaprine (FLEXERIL) 10 MG tablet Take 1 tablet (10 mg total) by mouth at bedtime. 30 tablet 0  . hydrochlorothiazide (HYDRODIURIL) 25 MG tablet TAKE 1 TABLET BY MOUTH DAILY. 100 tablet 2  . losartan (COZAAR) 25 MG tablet Take 1 tablet (25 mg total) by mouth daily. 30 tablet 9   No current facility-administered medications on file prior to visit.     BP (!) 160/82   Temp 97.9 F (36.6 C) (Oral)   Ht 5\' 9"  (1.753 m)   Wt 273 lb 9.6 oz (124.1 kg)   BMI 40.40 kg/m  Objective:   Physical Exam  Constitutional: He is oriented to person, place, and time. He appears well-developed and well-nourished. No distress.  Obese   Eyes: Conjunctivae and EOM are normal. Pupils are equal, round, and reactive to light. Right eye exhibits no discharge. Left eye exhibits no discharge. No scleral icterus.  Cardiovascular: Normal rate, regular rhythm, normal heart sounds and intact distal pulses.  Exam reveals no friction rub.   No murmur heard. Pulmonary/Chest: Effort normal and breath sounds normal. No respiratory distress. He has no wheezes. He has no rales. He exhibits no tenderness.  Neurological: He is alert and oriented to person, place, and time.  Skin: Skin is warm and dry. No rash noted. He is not diaphoretic. No erythema. No pallor.  Psychiatric: He has a normal mood and affect. His behavior is normal. Judgment and thought content normal.  Nursing note and  vitals reviewed.     Assessment & Plan:  1. Essential hypertension - Will increase to 50 mg Cozaar - losartan (COZAAR) 50 MG tablet; Take 1 tablet (50 mg total) by mouth daily.  Dispense: 90 tablet; Refill: 1 - Follow up in 3 months   2. Class 3 obesity due to excess calories with serious comorbidity and body mass index (BMI) of 40.0 to 44.9 in adult [facility]) - Has lost 6 pounds. Continue to use Wellbutrin. Increase time spent at the [facility]  3. Gastroesophageal reflux disease, esophagitis presence not specified  - omeprazole (PRILOSEC) 40 MG capsule; Take 1 capsule (40 mg total) by mouth daily.  Dispense: 30 capsule; Refill: [place]

## 2016-08-17 ENCOUNTER — Other Ambulatory Visit: Payer: Self-pay

## 2016-08-17 MED ORDER — BUPROPION HCL ER (SR) 150 MG PO TB12: 150.0000 mg | ORAL_TABLET | Freq: Two times a day (BID) | ORAL | 3 refills | Status: DC

## 2016-09-30 ENCOUNTER — Other Ambulatory Visit: Payer: Self-pay | Admitting: Adult Health

## 2016-09-30 DIAGNOSIS — I10 Essential (primary) hypertension: Secondary | ICD-10-CM

## 2016-09-30 NOTE — Telephone Encounter (Signed)
He should be taking 50mg

## 2016-10-16 ENCOUNTER — Other Ambulatory Visit: Payer: Self-pay | Admitting: *Deleted

## 2016-10-16 ENCOUNTER — Other Ambulatory Visit: Payer: Self-pay | Admitting: Adult Health

## 2016-10-16 DIAGNOSIS — K219 Gastro-esophageal reflux disease without esophagitis: Secondary | ICD-10-CM

## 2016-10-16 MED ORDER — OMEPRAZOLE 40 MG PO CPDR: 40.0000 mg | DELAYED_RELEASE_CAPSULE | Freq: Every day | ORAL | 1 refills | Status: DC

## 2017-01-05 ENCOUNTER — Other Ambulatory Visit: Payer: Medicare Other

## 2017-01-11 ENCOUNTER — Ambulatory Visit (INDEPENDENT_AMBULATORY_CARE_PROVIDER_SITE_OTHER)
Admission: RE | Admit: 2017-01-11 | Discharge: 2017-01-11 | Disposition: A | Discharge status: Home / Self Care | Payer: Medicare Other | Source: Ambulatory Visit | Attending: Adult Health | Admitting: Adult Health

## 2017-01-11 ENCOUNTER — Encounter: Payer: Self-pay | Admitting: Adult Health

## 2017-01-11 ENCOUNTER — Ambulatory Visit (INDEPENDENT_AMBULATORY_CARE_PROVIDER_SITE_OTHER): Payer: Medicare Other | Admitting: Adult Health

## 2017-01-11 VITALS — BP 132/72 | Temp 98.7°F | Ht 69.25 in | Wt 270.0 lb

## 2017-01-11 DIAGNOSIS — Z1211 Encounter for screening for malignant neoplasm of colon: Secondary | ICD-10-CM

## 2017-01-11 DIAGNOSIS — R05 Cough: Secondary | ICD-10-CM

## 2017-01-11 DIAGNOSIS — R053 Chronic cough: Secondary | ICD-10-CM

## 2017-01-11 DIAGNOSIS — Z125 Encounter for screening for malignant neoplasm of prostate: Secondary | ICD-10-CM

## 2017-01-11 DIAGNOSIS — I10 Essential (primary) hypertension: Secondary | ICD-10-CM | POA: Diagnosis not present

## 2017-01-11 DIAGNOSIS — Z1159 Encounter for screening for other viral diseases: Secondary | ICD-10-CM | POA: Diagnosis not present

## 2017-01-11 DIAGNOSIS — Z0000 Encounter for general adult medical examination without abnormal findings: Secondary | ICD-10-CM

## 2017-01-11 LAB — CBC WITH DIFFERENTIAL/PLATELET
Basophils Absolute: 0.1 10*3/uL (ref 0.0–0.1)
Basophils Relative: 1.1 % (ref 0.0–3.0)
EOS PCT: 4.8 % (ref 0.0–5.0)
Eosinophils Absolute: 0.3 10*3/uL (ref 0.0–0.7)
HCT: 40 % (ref 39.0–52.0)
Hemoglobin: 13.8 g/dL (ref 13.0–17.0)
LYMPHS ABS: 2.2 10*3/uL (ref 0.7–4.0)
Lymphocytes Relative: 42.8 % (ref 12.0–46.0)
MCHC: 34.5 g/dL (ref 30.0–36.0)
MCV: 90.9 fl (ref 78.0–100.0)
MONOS PCT: 10 % (ref 3.0–12.0)
Monocytes Absolute: 0.5 10*3/uL (ref 0.1–1.0)
NEUTROS ABS: 2.1 10*3/uL (ref 1.4–7.7)
NEUTROS PCT: 41.3 % — AB (ref 43.0–77.0)
Platelets: 281 10*3/uL (ref 150.0–400.0)
RBC: 4.4 Mil/uL (ref 4.22–5.81)
RDW: 12.3 % (ref 11.5–15.5)
WBC: 5.2 10*3/uL (ref 4.0–10.5)

## 2017-01-11 LAB — BASIC METABOLIC PANEL
BUN: 13 mg/dL (ref 6–23)
CHLORIDE: 102 meq/L (ref 96–112)
CO2: 29 mEq/L (ref 19–32)
Calcium: 9.8 mg/dL (ref 8.4–10.5)
Creatinine, Ser: 0.87 mg/dL (ref 0.40–1.50)
GFR: 110.98 mL/min (ref >60.00)
GLUCOSE: 129 mg/dL — AB (ref 70–99)
POTASSIUM: 4 meq/L (ref 3.5–5.1)
SODIUM: 139 meq/L (ref 135–145)

## 2017-01-11 LAB — HEPATIC FUNCTION PANEL
ALBUMIN: 4 g/dL (ref 3.5–5.2)
ALK PHOS: 96 U/L (ref 39–117)
ALT: 12 U/L (ref 0–53)
AST: 14 U/L (ref 0–37)
Bilirubin, Direct: 0.2 mg/dL (ref 0.0–0.3)
TOTAL PROTEIN: 7.1 g/dL (ref 6.0–8.3)
Total Bilirubin: 0.9 mg/dL (ref 0.2–1.2)

## 2017-01-11 LAB — LIPID PANEL
Cholesterol: 175 mg/dL (ref 0–200)
HDL: 41.9 mg/dL (ref >39.00)
LDL Cholesterol: 119 mg/dL — ABNORMAL HIGH (ref 0–99)
NONHDL: 132.63
Total CHOL/HDL Ratio: 4
Triglycerides: 69 mg/dL (ref 0.0–149.0)
VLDL: 13.8 mg/dL (ref 0.0–40.0)

## 2017-01-11 LAB — HEMOGLOBIN A1C: Hgb A1c MFr Bld: 5.4 % (ref 4.6–6.5)

## 2017-01-11 LAB — PSA: PSA: 2.77 ng/mL (ref 0.10–4.00)

## 2017-01-11 LAB — TSH: TSH: 1.09 u[IU]/mL (ref 0.35–4.50)

## 2017-01-11 NOTE — Patient Instructions (Signed)
I will follow up with you regarding your labs work   Please try omeprazole or prilosec for the cough, if this works let me know. I will get a chest xray and send you to pulmonary to try and figure out what is causing the cough   Someone will call you to schedule your colonoscopy  You are doing great with the weight loss and diet

## 2017-01-11 NOTE — Progress Notes (Signed)
Subjective:    Patient ID: [name], male    DOB: May 04, 1946, 71 y.o.   MRN: [id number]  HPI  Patient presents for yearly preventative medicine examination. He is a pleasant 71 year old male who  has a past medical history of Arthritis; Complication of anesthesia; Hypertension; and Neuromuscular disorder ([place]).  All immunizations and health maintenance protocols were reviewed with the patient and needed orders were placed. He is UTD on vaccinations   Appropriate screening laboratory values were ordered for the patient including screening of hyperlipidemia, renal function and hepatic function. If indicated by BPH, a PSA was ordered.  Medication reconciliation,  past medical history, social history, problem list and allergies were reviewed in detail with the patient  Goals were established with regard to weight loss, exercise, and  diet in compliance with medications. He continues to go to the [facility] multiple times per week. He is eating a hearth healthy diet   Wt Readings from Last 3 Encounters:  01/11/17 270 lb (122.5 kg)  07/12/16 273 lb 9.6 oz (124.1 kg)  05/18/16 279 lb 8 oz (126.8 kg)   BP Readings from Last 3 Encounters:  01/11/17 132/72  07/12/16 (!) 160/82  05/18/16 (!) 146/70    End of life planning was discussed.He has an health care power of attorney and living will.   He is due for a colonoscopy. He is up to date on dental and vision screens.   He takes Losartan 50mg  daily, HCTZ 25 mg daily and Norvasc 10 mg daily for hypertension control.   His only complaint is that for continued chronic cough. This has been an issue for greater than a year at this point. He has tried multiple prescription and OTC medications. He denies any heart burn or feeling as though he wakes up with a sour taste in his mouth     Review of Systems  Constitutional: Negative.   HENT: Negative.   Eyes: Negative.   Respiratory: Positive for cough.   Cardiovascular: Negative.     Gastrointestinal: Negative.   Endocrine: Negative.   Genitourinary: Negative.   Musculoskeletal: Negative.   Skin: Negative.   Allergic/Immunologic: Negative.   Neurological: Negative.   Hematological: Negative.   Psychiatric/Behavioral: Negative.   All other systems reviewed and are negative.  Past Medical History:  Diagnosis Date  . Arthritis   . Complication of anesthesia   . Hypertension   . Neuromuscular disorder ([place])    pinched nerve in foot    Social History   Social History  . Marital status: Married    Spouse name: N/A  . Number of children: N/A  . Years of education: N/A   Occupational History  . Not on file.   Social History Main Topics  . Smoking status: Former Research scientist (life sciences)  . Smokeless tobacco: Never Used  . Alcohol use Yes     Comment: holidays  . Drug use: No  . Sexual activity: Not on file     Comment: quit 40 yrs ago   Other Topics Concern  . Not on file   Social History Narrative   Retired for an Research scientist (physical sciences) for a [employer]   Three children who live in [place]       Past Surgical History:  Procedure Laterality Date  . CATARACT EXTRACTION Right   . JOINT REPLACEMENT     bilateral knee replacenent  . TOTAL KNEE REVISION  05/30/2011   Procedure: TOTAL KNEE REVISION;  Surgeon: Meredith Pel,  MD;  Location: [facility];  Service: Orthopedics;  Laterality: Right;  Right total knee arthroplasty polyethylene spacer exchange    Family History  Problem Relation Age of Onset  . Diabetes Sister   . Stroke Sister     Allergies  Allergen Reactions  . Lisinopril Cough    Current Outpatient Prescriptions on File Prior to Visit  Medication Sig Dispense Refill  . aspirin 81 MG tablet Take 81 mg by mouth daily.    . hydrochlorothiazide (HYDRODIURIL) 25 MG tablet TAKE 1 TABLET EVERY DAY 90 tablet 1  . losartan (COZAAR) 50 MG tablet Take 1 tablet by mouth daily 90 tablet 2   No current facility-administered medications on file  prior to visit.     BP 132/72 (BP Location: Left Arm)   Temp 98.7 F (37.1 C) (Oral)   Ht 5' 9.25" (1.759 m)   Wt 270 lb (122.5 kg)   BMI 39.58 kg/m        Objective:   Physical Exam  Constitutional: He is oriented to person, place, and time. He appears well-developed and well-nourished. No distress.  Obese   HENT:  Head: Normocephalic and atraumatic.  Right Ear: External ear normal.  Left Ear: External ear normal.  Nose: Nose normal.  Mouth/Throat: Oropharynx is clear and moist. No oropharyngeal exudate.  Eyes: Pupils are equal, round, and reactive to light. Conjunctivae and EOM are normal. Right eye exhibits no discharge. Left eye exhibits no discharge. No scleral icterus.  Neck: Normal range of motion. Neck supple. No JVD present. No tracheal deviation present. No thyromegaly present.  Cardiovascular: Normal rate, regular rhythm, normal heart sounds and intact distal pulses.  Exam reveals no gallop and no friction rub.   No murmur heard. Pulmonary/Chest: Effort normal and breath sounds normal. No stridor. No respiratory distress. He has no wheezes. He has no rales. He exhibits no tenderness.  Abdominal: Soft. Bowel sounds are normal. He exhibits no distension and no mass. There is no tenderness. There is no rebound and no guarding.  Genitourinary: Rectum normal and prostate normal. No penile tenderness.  Musculoskeletal: Normal range of motion. He exhibits no edema, tenderness or deformity.  Lymphadenopathy:    He has no cervical adenopathy.  Neurological: He is alert and oriented to person, place, and time. He has normal reflexes. He displays normal reflexes. No cranial nerve deficit. He exhibits normal muscle tone. Coordination normal.  Skin: Skin is warm and dry. No rash noted. He is not diaphoretic. No erythema. No pallor.  Psychiatric: He has a normal mood and affect. His behavior is normal. Judgment and thought content normal.  Nursing note and vitals reviewed.       Assessment & Plan:  1. Routine general medical examination at a health care facility  - Basic metabolic panel - CBC with Differential/Platelet - Hemoglobin A1c - Hepatic function panel - Lipid panel - TSH - PSA  2. Essential hypertension - Blood pressure has improved with weight loss and diet  - No change in medication at this time  - Basic metabolic panel - CBC with Differential/Platelet - Hemoglobin A1c - Hepatic function panel - Lipid panel - TSH - PSA  3. Need for hepatitis C screening test  - Hepatitis C antibody  4. Chronic cough - I am going to have him try Prilosec OTC. Follow up with me if no improvement - DG Chest 2 View; Future - Ambulatory referral to Pulmonology  5. Colon cancer screening  - Ambulatory referral to Gastroenterology  [name], NP

## 2017-01-12 ENCOUNTER — Encounter: Payer: Medicare Other | Admitting: Adult Health

## 2017-01-12 LAB — HEPATITIS C ANTIBODY: HCV AB: NONREACTIVE

## 2017-01-17 ENCOUNTER — Telehealth: Payer: Self-pay | Admitting: Adult Health

## 2017-01-17 NOTE — Telephone Encounter (Signed)
Noted  

## 2017-01-17 NOTE — Telephone Encounter (Signed)
Pt is returning misty call °

## 2017-02-11 ENCOUNTER — Other Ambulatory Visit: Payer: Self-pay | Admitting: Adult Health

## 2017-02-12 ENCOUNTER — Encounter: Payer: Self-pay | Admitting: Adult Health

## 2017-02-13 NOTE — Telephone Encounter (Signed)
Spoke to the pt.  He states he is no longer taking this medication.  Message sent to the pharmacy to refuse refill and to ask them to d/c medication.  No further action required.

## 2017-02-28 ENCOUNTER — Ambulatory Visit (INDEPENDENT_AMBULATORY_CARE_PROVIDER_SITE_OTHER): Payer: Medicare Other | Admitting: Pulmonary Disease

## 2017-02-28 ENCOUNTER — Encounter: Payer: Self-pay | Admitting: Pulmonary Disease

## 2017-02-28 ENCOUNTER — Other Ambulatory Visit: Payer: Self-pay | Admitting: Family Medicine

## 2017-02-28 VITALS — BP 130/78 | HR 78 | Ht 70.0 in | Wt 272.0 lb

## 2017-02-28 DIAGNOSIS — R05 Cough: Secondary | ICD-10-CM | POA: Diagnosis not present

## 2017-02-28 DIAGNOSIS — G4733 Obstructive sleep apnea (adult) (pediatric): Secondary | ICD-10-CM | POA: Diagnosis not present

## 2017-02-28 DIAGNOSIS — Z23 Encounter for immunization: Secondary | ICD-10-CM

## 2017-02-28 DIAGNOSIS — R053 Chronic cough: Secondary | ICD-10-CM

## 2017-02-28 LAB — NITRIC OXIDE: NITRIC OXIDE: 9

## 2017-02-28 MED ORDER — OMEPRAZOLE 40 MG PO CPDR: 40.0000 mg | DELAYED_RELEASE_CAPSULE | Freq: Two times a day (BID) | ORAL | 2 refills | Status: DC

## 2017-02-28 NOTE — Telephone Encounter (Signed)
Not sure if the pt is taking this medication.  Left a message for a return call.

## 2017-02-28 NOTE — Progress Notes (Signed)
[name]    [id number]    19-Sep-1945  Primary Care Physician:Nafziger, Tommi Rumps, NP  Referring Physician: [name], NP [place], [place] [address]  Chief complaint:  Consult for evaluation of chronic cough  HPI: 71 year old with history of arthritis, hypertension. Complains of chronic nonproductive cough for one year. The cough is mostly at night and when lying down. He denies any heartburn symptoms, postnasal drip, rhinitis. There is no associated dyspnea, wheezing, sputum production. He has tried over-the-counter Mucinex and cough medications without success. He had been started on Prilosec by his primary care physician but is not taking this on a regular basis.  He has history of cough with lisinopril. This was stopped in 2016 and he is currently on Norvasc, aspirin, hydrochloric acid and Cozaar  Pets: None Occupation: Retired Journalist, newspaper for a [employer] Exposures: No known exposures Smoking history: Minimal smoking history. Quit in early twenties Travel History: Not significant  Outpatient Encounter Prescriptions as of 02/28/2017  Medication Sig  . amLODipine (NORVASC) 10 MG tablet Take 10 mg by mouth daily.  Marland Kitchen aspirin 81 MG tablet Take 81 mg by mouth daily.  . hydrochlorothiazide (HYDRODIURIL) 25 MG tablet TAKE 1 TABLET EVERY DAY  . losartan (COZAAR) 50 MG tablet Take 1 tablet by mouth daily   No facility-administered encounter medications on file as of 02/28/2017.     Allergies as of 02/28/2017 - Review Complete 02/28/2017  Allergen Reaction Noted  . Lisinopril Cough 01/11/2016    Past Medical History:  Diagnosis Date  . Arthritis   . Complication of anesthesia   . Hypertension   . Neuromuscular disorder ([place])    pinched nerve in foot    Past Surgical History:  Procedure Laterality Date  . CATARACT EXTRACTION Right   . JOINT REPLACEMENT     bilateral knee replacenent  . TOTAL KNEE REVISION  05/30/2011   Procedure: TOTAL KNEE REVISION;  Surgeon: Meredith Pel, MD;  Location: [facility];  Service: Orthopedics;  Laterality: Right;  Right total knee arthroplasty polyethylene spacer exchange    Family History  Problem Relation Age of Onset  . Diabetes Sister   . Stroke Sister     Social History   Social History  . Marital status: Married    Spouse name: N/A  . Number of children: N/A  . Years of education: N/A   Occupational History  . Not on file.   Social History Main Topics  . Smoking status: Former Smoker    Quit date: 03/01/1967  . Smokeless tobacco: Never Used     Comment: Pt states he was a social smoker  . Alcohol use Yes     Comment: holidays  . Drug use: No  . Sexual activity: Not on file   Other Topics Concern  . Not on file   Social History Narrative   Retired for an Research scientist (physical sciences) for a [employer]   Three children who live in [place] of systems: Review of Systems  Constitutional: Negative for fever and chills.  HENT: Negative.   Eyes: Negative for blurred vision.  Respiratory: as per HPI  Cardiovascular: Negative for chest pain and palpitations.  Gastrointestinal: Negative for vomiting, diarrhea, blood per rectum. Genitourinary: Negative for dysuria, urgency, frequency and hematuria.  Musculoskeletal: Negative for myalgias, back pain and joint pain.  Skin: Negative for itching and rash.  Neurological: Negative for dizziness, tremors, focal  weakness, seizures and loss of consciousness.  Endo/Heme/Allergies: Negative for environmental allergies.  Psychiatric/Behavioral: Negative for depression, suicidal ideas and hallucinations.  All other systems reviewed and are negative.  Physical Exam: Blood pressure 130/78, pulse 78, height 5\' 10"  (1.778 m), weight 272 lb (123.4 kg), SpO2 96 %. Gen:      No acute distress HEENT:  EOMI, sclera anicteric Neck:     No masses; no thyromegaly Lungs:    Clear to auscultation bilaterally;  normal respiratory effort CV:         Regular rate and rhythm; no murmurs Abd:      + bowel sounds; soft, non-tender; no palpable masses, no distension Ext:    No edema; adequate peripheral perfusion Skin:      Warm and dry; no rash Neuro: alert and oriented x 3 Psych: normal mood and affect  Data Reviewed: FENO 02/28/17- 9  CT angiogram 02/09/05-no pulmonary embolism, mild dependent atelectasis. Chest x-ray 01/11/17-no acute Abnormality I have reviewed all images personally  Assessment:  Chronic cough Suspect upper airway cough syndrome from GERD, laryngeal reflux. I have asked him to trial Prilosec 40 mg twice daily for a couple of weeks. Suspicion for asthma as low. We'll schedule pulmonary function test for further eval CT of the chest in 2006 shows mild dependent atelectasis. If the PFTs are abnormal then we may consider CT of the chest for further evaluation of lung fibrosis.   He also may be having cough from cozaar. Although ARB is has lower incidence of cough compared to ACEI 3% of patient still have this side effect. Given his reaction to lisinopril he may be more susceptible to it. We can consider substituting the cozaar if his symptoms are persistent.    Suspected OSA Untreated sleep apnea may be contributing to symptoms of cough. We'll schedule him for a home sleep study. He does not want to come to the sleep lab for a test.  Plan/Recommendations: - Increase Prilosec to 40 mg bid - Schedule PFTs and home sleep study.   Marshell Garfinkel MD [facility] Pager [phone] 02/28/2017, 4:29 PM  CC: [name], NP

## 2017-02-28 NOTE — Telephone Encounter (Signed)
Patient is no longer taking this medication.  Refill request denied.  Message sent to the pharmacy to d/c.

## 2017-02-28 NOTE — Telephone Encounter (Signed)
° ° ° ° °  Pt return your call he is no longer taking bupropion hci so he did not req the refill

## 2017-02-28 NOTE — Patient Instructions (Signed)
We will start on Prilosec 40 mg twice daily. Use this for 2-4 weeks to see if the cough will improve We'll schedule for pulmonary function test and a home sleep study Return to clinic in 1-2 months.

## 2017-03-20 DIAGNOSIS — G4733 Obstructive sleep apnea (adult) (pediatric): Secondary | ICD-10-CM | POA: Diagnosis not present

## 2017-03-21 DIAGNOSIS — G4733 Obstructive sleep apnea (adult) (pediatric): Secondary | ICD-10-CM | POA: Diagnosis not present

## 2017-04-04 ENCOUNTER — Other Ambulatory Visit: Payer: Self-pay | Admitting: *Deleted

## 2017-04-04 DIAGNOSIS — G4733 Obstructive sleep apnea (adult) (pediatric): Secondary | ICD-10-CM

## 2017-04-16 ENCOUNTER — Encounter: Payer: Self-pay | Admitting: Pulmonary Disease

## 2017-04-16 ENCOUNTER — Ambulatory Visit (INDEPENDENT_AMBULATORY_CARE_PROVIDER_SITE_OTHER): Payer: Medicare Other | Admitting: Pulmonary Disease

## 2017-04-16 ENCOUNTER — Ambulatory Visit: Payer: Medicare Other | Admitting: Pulmonary Disease

## 2017-04-16 VITALS — BP 164/90 | HR 76 | Ht 69.0 in | Wt 273.0 lb

## 2017-04-16 DIAGNOSIS — R053 Chronic cough: Secondary | ICD-10-CM

## 2017-04-16 DIAGNOSIS — G4733 Obstructive sleep apnea (adult) (pediatric): Secondary | ICD-10-CM

## 2017-04-16 DIAGNOSIS — R05 Cough: Secondary | ICD-10-CM

## 2017-04-16 LAB — PULMONARY FUNCTION TEST
DL/VA % pred: 90 %
DL/VA: 4.13 ml/min/mmHg/L
DLCO COR % PRED: 70 %
DLCO UNC: 21.66 ml/min/mmHg
DLCO cor: 21.91 ml/min/mmHg
DLCO unc % pred: 69 %
FEF 25-75 Post: 1.15 L/sec
FEF 25-75 Pre: 1.87 L/sec
FEF2575-%Change-Post: -38 %
FEF2575-%Pred-Post: 49 %
FEF2575-%Pred-Pre: 80 %
FEV1-%CHANGE-POST: -6 %
FEV1-%PRED-POST: 76 %
FEV1-%PRED-PRE: 81 %
FEV1-POST: 2.08 L
FEV1-PRE: 2.22 L
FEV1FVC-%Change-Post: 3 %
FEV1FVC-%PRED-PRE: 99 %
FEV6-%Change-Post: -10 %
FEV6-%PRED-POST: 73 %
FEV6-%PRED-PRE: 82 %
FEV6-POST: 2.55 L
FEV6-Pre: 2.86 L
FEV6FVC-%CHANGE-POST: -3 %
FEV6FVC-%PRED-POST: 101 %
FEV6FVC-%Pred-Pre: 105 %
FVC-%CHANGE-POST: -9 %
FVC-%PRED-POST: 72 %
FVC-%Pred-Pre: 80 %
FVC-POST: 2.64 L
FVC-Pre: 2.93 L
POST FEV6/FVC RATIO: 96 %
PRE FEV1/FVC RATIO: 76 %
PRE FEV6/FVC RATIO: 100 %
Post FEV1/FVC ratio: 79 %
RV % pred: 95 %
RV: 2.33 L
TLC % PRED: 74 %
TLC: 5.07 L

## 2017-04-16 NOTE — Patient Instructions (Signed)
Please continue using the Prilosec twice daily We will start you on auto CPAP 5-15 cm.  Follow-up in 3 months to review compliance and response.

## 2017-04-16 NOTE — Progress Notes (Signed)
[name]    [id number]    November 22, 1945  Primary Care Physician:Nafziger, Tommi Rumps, NP  Referring Physician: [name], NP [place], [place] [address]  Chief complaint:  Follow up for chronic cough  HPI: 71 year old with history of arthritis, hypertension. Complains of chronic nonproductive cough for one year. The cough is mostly at night and when lying down. He denies any heartburn symptoms, postnasal drip, rhinitis. There is no associated dyspnea, wheezing, sputum production. He has tried over-the-counter Mucinex and cough medications without success. He had been started on Prilosec by his primary care physician but is not taking this on a regular basis.  He has history of cough with lisinopril. 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 Quit in early twenties Travel History: Not significant  Interim history:  Reports that cough is slowly improving after Prilosec was started. He had a home sleep study which shows moderate sleep apnea.  He is here to discuss the results and plan for therapy.  Outpatient Encounter Medications as of 04/16/2017  Medication Sig  . amLODipine (NORVASC) 10 MG tablet Take 10 mg by mouth daily.  Marland Kitchen aspirin 81 MG tablet Take 81 mg by mouth daily.  . hydrochlorothiazide (HYDRODIURIL) 25 MG tablet TAKE 1 TABLET EVERY DAY  . losartan (COZAAR) 50 MG tablet Take 1 tablet by mouth daily  . omeprazole (PRILOSEC) 40 MG capsule Take 1 capsule (40 mg total) by mouth 2 (two) times daily.   No facility-administered encounter medications on file as of 04/16/2017.     Allergies as of 04/16/2017 - Review Complete 04/16/2017  Allergen Reaction Noted  . Lisinopril Cough 01/11/2016    Past Medical History:  Diagnosis Date  . Arthritis     . Complication of anesthesia   . Hypertension   . Neuromuscular disorder ([place])    pinched nerve in foot    Past Surgical History:  Procedure Laterality Date  . CATARACT EXTRACTION Right   . JOINT REPLACEMENT     bilateral knee replacenent  . TOTAL KNEE REVISION  05/30/2011   Procedure: TOTAL KNEE REVISION;  Surgeon: Meredith Pel, MD;  Location: [facility];  Service: Orthopedics;  Laterality: Right;  Right total knee arthroplasty polyethylene spacer exchange    Family History  Problem Relation Age of Onset  . Diabetes Sister   . Stroke Sister     Social History   Socioeconomic History  . Marital status: Married    Spouse name: Not on file  . Number of children: Not on file  . Years of education: Not on file  . Highest education level: Not on file  Social Needs  . Financial resource strain: Not on file  . Food insecurity - worry: Not on file  . Food insecurity - inability: Not on file  . Transportation needs - medical: Not on file  . Transportation needs - non-medical: Not on file  Occupational History  . Not on file  Tobacco Use  . Smoking status: Former Smoker    Last attempt to quit: 03/01/1967    Years since quitting: 50.1  . Smokeless tobacco: Never Used  . Tobacco comment: Pt states he was a social smoker  Substance and Sexual Activity  . Alcohol use: Yes    Comment: holidays  . Drug  use: No  . Sexual activity: Not on file  Other Topics Concern  . Not on file  Social History Narrative   Retired for an Research scientist (physical sciences) for a [employer]   Three children who live in [place] of systems: Review of Systems  Constitutional: Negative for fever and chills.  HENT: Negative.   Eyes: Negative for blurred vision.  Respiratory: as per HPI  Cardiovascular: Negative for chest pain and palpitations.  Gastrointestinal: Negative for vomiting, diarrhea, blood per rectum. Genitourinary: Negative for dysuria, urgency, frequency and hematuria.   Musculoskeletal: Negative for myalgias, back pain and joint pain.  Skin: Negative for itching and rash.  Neurological: Negative for dizziness, tremors, focal weakness, seizures and loss of consciousness.  Endo/Heme/Allergies: Negative for environmental allergies.  Psychiatric/Behavioral: Negative for depression, suicidal ideas and hallucinations.  All other systems reviewed and are negative.  Physical Exam: Blood pressure 130/78, pulse 78, height 5\' 10"  (1.778 m), weight 272 lb (123.4 kg), SpO2 96 %. Gen:      No acute distress HEENT:  EOMI, sclera anicteric Neck:     No masses; no thyromegaly Lungs:    Clear to auscultation bilaterally; normal respiratory effort CV:         Regular rate and rhythm; no murmurs Abd:      + bowel sounds; soft, non-tender; no palpable masses, no distension Ext:    No edema; adequate peripheral perfusion Skin:      Warm and dry; no rash Neuro: alert and oriented x 3 Psych: normal mood and affect  Data Reviewed: FENO 02/28/17- 9  CT angiogram 02/09/05-no pulmonary embolism, mild dependent atelectasis. Chest x-ray 01/11/17-no acute Abnormality I have reviewed all images personally  PFTs 04/16/17 FVC 2.64 [72%], FEV1 2.08 (76%], F/F 79, TLC 74%, DLCO 69% Minimal obstruction with mild restriction in diffusion impairment  Sleep study 03/20/17 Moderate sleep apnea, AHI 22 with desaturations to 80s.  Assessment:  Chronic cough Suspect upper airway cough syndrome from GERD, laryngeal reflux. Continue on Prilosec 40 mg twice daily which is helping with his symptoms. Suspicion for asthma as low. CT of the chest in 2006 shows mild dependent atelectasis and chest x-ray from 2018 does not show any evidence of interstitial lung disease. He also may be having cough from cozaar. Although ARB is has lower incidence of cough compared to ACEI 3% of patient still have this side effect. Given his reaction to lisinopril he may be more susceptible to it. We can consider  substituting the cozaar if his symptoms are persistent.    Moderate OSA Start auto CPAP.  He does not want to come to the sleep lab for titration.  Plan/Recommendations: - Continue Prilosec to 40 mg bid - Start auto CPAP 5-15  Marshell Garfinkel MD [facility] Pager [phone] 04/16/2017, 3:27 PM  CC: [name], NP

## 2017-04-16 NOTE — Progress Notes (Signed)
PFT completed today 04/16/17.  

## 2017-04-23 ENCOUNTER — Other Ambulatory Visit: Payer: Self-pay | Admitting: Adult Health

## 2017-04-23 MED ORDER — HYDROCHLOROTHIAZIDE 25 MG PO TABS: 25.0000 mg | ORAL_TABLET | Freq: Every day | ORAL | 2 refills | Status: DC

## 2017-04-23 MED ORDER — AMLODIPINE BESYLATE 10 MG PO TABS: 10.0000 mg | ORAL_TABLET | Freq: Every day | ORAL | 2 refills | Status: DC

## 2017-05-21 DIAGNOSIS — G4733 Obstructive sleep apnea (adult) (pediatric): Secondary | ICD-10-CM | POA: Diagnosis not present

## 2017-05-28 ENCOUNTER — Other Ambulatory Visit: Payer: Self-pay | Admitting: Pulmonary Disease

## 2017-06-21 DIAGNOSIS — G4733 Obstructive sleep apnea (adult) (pediatric): Secondary | ICD-10-CM | POA: Diagnosis not present

## 2017-07-12 ENCOUNTER — Ambulatory Visit: Payer: Medicare HMO | Admitting: Pulmonary Disease

## 2017-07-12 ENCOUNTER — Encounter: Payer: Self-pay | Admitting: Pulmonary Disease

## 2017-07-12 VITALS — BP 130/80 | HR 85 | Ht 69.0 in | Wt 276.6 lb

## 2017-07-12 DIAGNOSIS — R05 Cough: Secondary | ICD-10-CM | POA: Diagnosis not present

## 2017-07-12 DIAGNOSIS — R053 Chronic cough: Secondary | ICD-10-CM

## 2017-07-12 DIAGNOSIS — G4733 Obstructive sleep apnea (adult) (pediatric): Secondary | ICD-10-CM

## 2017-07-12 DIAGNOSIS — G4734 Idiopathic sleep related nonobstructive alveolar hypoventilation: Secondary | ICD-10-CM | POA: Diagnosis not present

## 2017-07-12 NOTE — Patient Instructions (Signed)
I am glad that your breathing is doing well Please continue using the CPAP. Follow-up 6 months.

## 2017-07-12 NOTE — Progress Notes (Addendum)
DEMONTAE Mullins    [id number]    1945-08-08  Primary Care Physician:Nafziger, Tommi Rumps, NP  Referring Physician: [name], NP [place], [place] [address]  Chief complaint:  Follow up for chronic cough, OSA  HPI: 72 year old with history of arthritis, hypertension. Complains of chronic nonproductive cough for one year. The cough is mostly at night and when lying down. He denies any heartburn symptoms, postnasal drip, rhinitis. There is no associated dyspnea, wheezing, sputum production. He has tried over-the-counter Mucinex and cough medications without success. He had been started on Prilosec by his primary care physician but is not taking this on a regular basis.  He has history of cough with lisinopril. This was stopped in 2016 and he is currently on Norvasc, aspirin, hydrochloric acid and Cozaar  Pets: None Occupation: Retired Journalist, newspaper for a [employer] Exposures: No known exposures Smoking history: Minimal smoking history. Quit in early twenties Travel History: Not significant  Interim history: Cough is better though he has mild persistent symptoms.  He sto has pped using the Prilosec  Home sleep study showed moderate sleep apnea.  He did not want to come in for a titration study so was started on auto CPAP He is tolerating this well and reports improved sleep quality.   Outpatient Encounter Medications as of 07/12/2017  Medication Sig  . amLODipine (NORVASC) 10 MG tablet Take 1 tablet (10 mg total) by mouth daily.  Marland Kitchen aspirin 81 MG tablet Take 81 mg by mouth daily.  . hydrochlorothiazide (HYDRODIURIL) 25 MG tablet Take 1 tablet (25 mg total) by mouth daily.  Marland Kitchen losartan (COZAAR) 50 MG tablet Take 1 tablet by mouth daily  . omeprazole (PRILOSEC) 40 MG capsule TAKE 1 CAPSULE BY MOUTH TWICE A DAY  . [DISCONTINUED] amLODipine (NORVASC) 10 MG tablet TAKE 1 TABLET BY MOUTH EVERY DAY   No facility-administered encounter medications on  file as of 07/12/2017.     Allergies as of 07/12/2017 - Review Complete 07/12/2017  Allergen Reaction Noted  . Lisinopril Cough 01/11/2016    Past Medical History:  Diagnosis Date  . Arthritis   . Complication of anesthesia   . Hypertension   . Neuromuscular disorder ([place])    pinched nerve in foot    Past Surgical History:  Procedure Laterality Date  . CATARACT EXTRACTION Right   . JOINT REPLACEMENT     bilateral knee replacenent  . TOTAL KNEE REVISION  05/30/2011   Procedure: TOTAL KNEE REVISION;  Surgeon: Meredith Pel, MD;  Location: [facility];  Service: Orthopedics;  Laterality: Right;  Right total knee arthroplasty polyethylene spacer exchange    Family History  Problem Relation Age of Onset  . Diabetes Sister   . Stroke Sister     Social History   Socioeconomic History  . Marital status: Married    Spouse name: Not on file  . Number of children: Not on file  . Years of education: Not on file  . Highest education level: Not on file  Social Needs  . Financial resource strain: Not on file  . Food insecurity - worry: Not on file  . Food insecurity - inability: Not on file  . Transportation needs - medical: Not on file  . Transportation needs - non-medical: Not on file  Occupational History  . Not on file  Tobacco Use  . Smoking status: Former Smoker    Last attempt to quit: 03/01/1967    Years  since quitting: 50.4  . Smokeless tobacco: Never Used  . Tobacco comment: Pt states he was a social smoker  Substance and Sexual Activity  . Alcohol use: Yes    Comment: holidays  . Drug use: No  . Sexual activity: Not on file  Other Topics Concern  . Not on file  Social History Narrative   Retired for an Research scientist (physical sciences) for a [employer]   Three children who live in [place] of systems: Review of Systems  Constitutional: Negative for fever and chills.  HENT: Negative.   Eyes: Negative for blurred vision.  Respiratory: as per HPI    Cardiovascular: Negative for chest pain and palpitations.  Gastrointestinal: Negative for vomiting, diarrhea, blood per rectum. Genitourinary: Negative for dysuria, urgency, frequency and hematuria.  Musculoskeletal: Negative for myalgias, back pain and joint pain.  Skin: Negative for itching and rash.  Neurological: Negative for dizziness, tremors, focal weakness, seizures and loss of consciousness.  Endo/Heme/Allergies: Negative for environmental allergies.  Psychiatric/Behavioral: Negative for depression, suicidal ideas and hallucinations.  All other systems reviewed and are negative.  Physical Exam: Blood pressure 130/78, pulse 78, height 5\' 10"  (1.778 m), weight 272 lb (123.4 kg), SpO2 96 %. Gen:      No acute distress HEENT:  EOMI, sclera anicteric Neck:     No masses; no thyromegaly Lungs:    Clear to auscultation bilaterally; normal respiratory effort CV:         Regular rate and rhythm; no murmurs Abd:      + bowel sounds; soft, non-tender; no palpable masses, no distension Ext:    No edema; adequate peripheral perfusion Skin:      Warm and dry; no rash Neuro: alert and oriented x 3 Psych: normal mood and affect  Data Reviewed: FENO 02/28/17- 9  CT angiogram 02/09/05-no pulmonary embolism, mild dependent atelectasis. Chest x-ray 01/11/17-no acute Abnormality I have reviewed all images personally  PFTs 04/16/17 FVC 2.64 [72%], FEV1 2.08 (76%], F/F 79, TLC 74%, DLCO 69% Minimal obstruction with mild restriction in diffusion impairment  Sleep study 03/20/17 Moderate sleep apnea, AHI 22 with desaturations to 80s.  CPAP titration 06/11/17-07/10/17  CPAP download 06/03/17-07/10/17 97% usage AutoSet 5-15, Median pressure 11.9  AHI 0.5 on the CPAP  Assessment:  Chronic cough Suspect upper airway cough syndrome from GERD, laryngeal reflux. Suspicion for asthma as low.  Overall cough is improved and he has stopped taking his PPI  CT of the chest in 2006 shows mild dependent  atelectasis and chest x-ray from 2018 does not show any evidence of interstitial lung disease. He also may be having cough from cozaar. Although ARB is has lower incidence of cough compared to ACEI 3% of patient still have this side effect. Given his reaction to lisinopril he may be more susceptible to it. We can consider substituting the cozaar if his symptoms worsen.   Moderate OSA Started on auto CPAP.  Download reviewed with good compliance. Will check overnight oximetry as he had desats in the sleep study.  Plan/Recommendations: - Continue auto CPAP - Check overnight oximetry  Marshell Garfinkel MD [facility] 07/12/2017, 1:46 PM  CC: [name], NP  Addendum: Overnight oximetry 07/20/2017 on room air and CPAP Oxygen desaturation index 0.46.  Low O2 sat 85%. Oxygen levels are adequate on CPAP.  He will not need supplemental oxygen.  Marshell Garfinkel MD [facility] 09/13/2017, 12:55 PM

## 2017-07-19 DIAGNOSIS — G4733 Obstructive sleep apnea (adult) (pediatric): Secondary | ICD-10-CM | POA: Diagnosis not present

## 2017-07-20 DIAGNOSIS — J449 Chronic obstructive pulmonary disease, unspecified: Secondary | ICD-10-CM | POA: Diagnosis not present

## 2017-07-20 DIAGNOSIS — R0902 Hypoxemia: Secondary | ICD-10-CM | POA: Diagnosis not present

## 2017-07-21 ENCOUNTER — Encounter: Payer: Self-pay | Admitting: Pulmonary Disease

## 2017-08-07 ENCOUNTER — Ambulatory Visit (INDEPENDENT_AMBULATORY_CARE_PROVIDER_SITE_OTHER): Payer: Medicare HMO | Admitting: Adult Health

## 2017-08-07 ENCOUNTER — Encounter: Payer: Self-pay | Admitting: Adult Health

## 2017-08-07 VITALS — BP 140/70 | HR 73 | Temp 98.1°F | Wt 277.0 lb

## 2017-08-07 DIAGNOSIS — R053 Chronic cough: Secondary | ICD-10-CM

## 2017-08-07 DIAGNOSIS — R05 Cough: Secondary | ICD-10-CM | POA: Diagnosis not present

## 2017-08-07 MED ORDER — BENZONATATE 200 MG PO CAPS: 200.0000 mg | ORAL_CAPSULE | Freq: Two times a day (BID) | ORAL | 0 refills | Status: DC | PRN

## 2017-08-07 NOTE — Progress Notes (Signed)
Subjective:    Patient ID: [name], male    DOB: 07-26-1945, 72 y.o.   MRN: [id number]  HPI 72 year old male who  has a past medical history of Arthritis, Complication of anesthesia, Hypertension, and Neuromuscular disorder ([place]).  He presents to the clinic today for the chronic complaint of cough. He has been seen by Pulmonary for this in December. He reported that since starting Prilosec he has not been coughing as much. He was around a sick grandchild at the beginning of the week and his cough became worse but has been improving over the week.    Review of Systems See HPI   Past Medical History:  Diagnosis Date  . Arthritis   . Complication of anesthesia   . Hypertension   . Neuromuscular disorder ([place])    pinched nerve in foot    Social History   Socioeconomic History  . Marital status: Married    Spouse name: Not on file  . Number of children: Not on file  . Years of education: Not on file  . Highest education level: Not on file  Occupational History  . Not on file  Social Needs  . Financial resource strain: Not on file  . Food insecurity:    Worry: Not on file    Inability: Not on file  . Transportation needs:    Medical: Not on file    Non-medical: Not on file  Tobacco Use  . Smoking status: Former Smoker    Last attempt to quit: 03/01/1967    Years since quitting: 50.4  . Smokeless tobacco: Never Used  . Tobacco comment: Pt states he was a social smoker  Substance and Sexual Activity  . Alcohol use: Yes    Comment: holidays  . Drug use: No  . Sexual activity: Not on file  Lifestyle  . Physical activity:    Days per week: Not on file    Minutes per session: Not on file  . Stress: Not on file  Relationships  . Social connections:    Talks on phone: Not on file    Gets together: Not on file    Attends religious service: Not on file    Active member of club or organization: Not on file    Attends meetings of clubs or organizations: Not on  file    Relationship status: Not on file  . Intimate partner violence:    Fear of current or ex partner: Not on file    Emotionally abused: Not on file    Physically abused: Not on file    Forced sexual activity: Not on file  Other Topics Concern  . Not on file  Social History Narrative   Retired for an Research scientist (physical sciences) for a [employer]   Three children who live in [place]    Past Surgical History:  Procedure Laterality Date  . CATARACT EXTRACTION Right   . JOINT REPLACEMENT     bilateral knee replacenent  . TOTAL KNEE REVISION  05/30/2011   Procedure: TOTAL KNEE REVISION;  Surgeon: Meredith Pel, MD;  Location: [facility];  Service: Orthopedics;  Laterality: Right;  Right total knee arthroplasty polyethylene spacer exchange    Family History  Problem Relation Age of Onset  . Diabetes Sister   . Stroke Sister     Allergies  Allergen Reactions  . Lisinopril Cough    Current Outpatient Medications on File Prior to Visit  Medication Sig Dispense Refill  .  amLODipine (NORVASC) 10 MG tablet Take 1 tablet (10 mg total) by mouth daily. 90 tablet 2  . aspirin 81 MG tablet Take 81 mg by mouth daily.    . hydrochlorothiazide (HYDRODIURIL) 25 MG tablet Take 1 tablet (25 mg total) by mouth daily. 90 tablet 2  . losartan (COZAAR) 50 MG tablet Take 1 tablet by mouth daily 90 tablet 2  . omeprazole (PRILOSEC) 40 MG capsule TAKE 1 CAPSULE BY MOUTH TWICE A DAY 60 capsule 2   No current facility-administered medications on file prior to visit.     BP 140/70 (BP Location: Left Arm)   Pulse 73   Temp 98.1 F (36.7 C) (Oral)   Wt 277 lb (125.6 kg)   SpO2 98%   BMI 40.91 kg/m       Objective:   Physical Exam  Constitutional: He is oriented to person, place, and time. He appears well-developed and well-nourished. No distress.  Cardiovascular: Normal rate, regular rhythm, normal heart sounds and intact distal pulses. Exam reveals no gallop and no friction rub.  No  murmur heard. Pulmonary/Chest: Effort normal and breath sounds normal. No respiratory distress. He has no wheezes. He has no rales. He exhibits no tenderness.  Neurological: He is alert and oriented to person, place, and time.  Skin: Skin is warm and dry. He is not diaphoretic.  Psychiatric: He has a normal mood and affect. His behavior is normal. Judgment and thought content normal.  Nursing note and vitals reviewed.     Assessment & Plan:  1. Chronic cough - Chronic cough that may be exacerbated by a virus  - Follow up with pulmonary as needed  [name], NP

## 2017-08-08 ENCOUNTER — Other Ambulatory Visit: Payer: Self-pay | Admitting: Pulmonary Disease

## 2017-08-18 ENCOUNTER — Other Ambulatory Visit: Payer: Self-pay | Admitting: Adult Health

## 2017-08-18 DIAGNOSIS — I10 Essential (primary) hypertension: Secondary | ICD-10-CM

## 2017-08-19 DIAGNOSIS — G4733 Obstructive sleep apnea (adult) (pediatric): Secondary | ICD-10-CM | POA: Diagnosis not present

## 2017-08-21 NOTE — Telephone Encounter (Signed)
Sent to the pharmacy by e-scribe for 90 days.  Pt due back for annual visit 12/2017

## 2017-08-27 ENCOUNTER — Telehealth: Payer: Self-pay | Admitting: Pulmonary Disease

## 2017-08-27 NOTE — Telephone Encounter (Signed)
Faxed over pt's OV to [facility] from 2/28. Nothing further needed at this time.

## 2017-09-04 ENCOUNTER — Other Ambulatory Visit: Payer: Self-pay | Admitting: Pulmonary Disease

## 2017-09-18 DIAGNOSIS — G4733 Obstructive sleep apnea (adult) (pediatric): Secondary | ICD-10-CM | POA: Diagnosis not present

## 2017-09-29 ENCOUNTER — Other Ambulatory Visit: Payer: Self-pay | Admitting: Pulmonary Disease

## 2017-10-19 DIAGNOSIS — G4733 Obstructive sleep apnea (adult) (pediatric): Secondary | ICD-10-CM | POA: Diagnosis not present

## 2017-10-26 ENCOUNTER — Other Ambulatory Visit: Payer: Self-pay | Admitting: Pulmonary Disease

## 2017-11-17 ENCOUNTER — Other Ambulatory Visit: Payer: Self-pay | Admitting: Adult Health

## 2017-11-17 DIAGNOSIS — I10 Essential (primary) hypertension: Secondary | ICD-10-CM

## 2017-11-18 DIAGNOSIS — G4733 Obstructive sleep apnea (adult) (pediatric): Secondary | ICD-10-CM | POA: Diagnosis not present

## 2017-11-20 NOTE — Telephone Encounter (Signed)
Sent to the pharmacy by e-scribe. 

## 2017-11-22 DIAGNOSIS — G4733 Obstructive sleep apnea (adult) (pediatric): Secondary | ICD-10-CM | POA: Diagnosis not present

## 2017-11-25 ENCOUNTER — Other Ambulatory Visit: Payer: Self-pay | Admitting: Adult Health

## 2017-11-27 NOTE — Telephone Encounter (Signed)
Denied.  Filled on 04/23/18 for 9 months.  Refill request is too early.

## 2017-12-19 DIAGNOSIS — G4733 Obstructive sleep apnea (adult) (pediatric): Secondary | ICD-10-CM | POA: Diagnosis not present

## 2018-01-15 ENCOUNTER — Encounter: Payer: Medicare HMO | Admitting: Adult Health

## 2018-01-19 DIAGNOSIS — G4733 Obstructive sleep apnea (adult) (pediatric): Secondary | ICD-10-CM | POA: Diagnosis not present

## 2018-01-23 ENCOUNTER — Encounter: Payer: Self-pay | Admitting: Adult Health

## 2018-01-23 ENCOUNTER — Ambulatory Visit (INDEPENDENT_AMBULATORY_CARE_PROVIDER_SITE_OTHER): Payer: Medicare HMO | Admitting: Adult Health

## 2018-01-23 VITALS — BP 122/60 | Temp 98.4°F | Ht 69.0 in | Wt 270.0 lb

## 2018-01-23 DIAGNOSIS — Z0000 Encounter for general adult medical examination without abnormal findings: Secondary | ICD-10-CM | POA: Diagnosis not present

## 2018-01-23 DIAGNOSIS — I10 Essential (primary) hypertension: Secondary | ICD-10-CM

## 2018-01-23 DIAGNOSIS — Z1211 Encounter for screening for malignant neoplasm of colon: Secondary | ICD-10-CM | POA: Diagnosis not present

## 2018-01-23 DIAGNOSIS — Z23 Encounter for immunization: Secondary | ICD-10-CM

## 2018-01-23 DIAGNOSIS — Z125 Encounter for screening for malignant neoplasm of prostate: Secondary | ICD-10-CM

## 2018-01-23 DIAGNOSIS — Z760 Encounter for issue of repeat prescription: Secondary | ICD-10-CM | POA: Diagnosis not present

## 2018-01-23 LAB — CBC WITH DIFFERENTIAL/PLATELET
BASOS ABS: 0 10*3/uL (ref 0.0–0.1)
Basophils Relative: 0.8 % (ref 0.0–3.0)
Eosinophils Absolute: 0.1 10*3/uL (ref 0.0–0.7)
Eosinophils Relative: 2.8 % (ref 0.0–5.0)
HEMATOCRIT: 37.9 % — AB (ref 39.0–52.0)
HEMOGLOBIN: 13 g/dL (ref 13.0–17.0)
LYMPHS PCT: 37.1 % (ref 12.0–46.0)
Lymphs Abs: 1.7 10*3/uL (ref 0.7–4.0)
MCHC: 34.4 g/dL (ref 30.0–36.0)
MCV: 89.5 fl (ref 78.0–100.0)
MONOS PCT: 11.2 % (ref 3.0–12.0)
Monocytes Absolute: 0.5 10*3/uL (ref 0.1–1.0)
NEUTROS ABS: 2.3 10*3/uL (ref 1.4–7.7)
Neutrophils Relative %: 48.1 % (ref 43.0–77.0)
PLATELETS: 255 10*3/uL (ref 150.0–400.0)
RBC: 4.24 Mil/uL (ref 4.22–5.81)
RDW: 12.4 % (ref 11.5–15.5)
WBC: 4.7 10*3/uL (ref 4.0–10.5)

## 2018-01-23 LAB — LIPID PANEL
CHOL/HDL RATIO: 4
Cholesterol: 172 mg/dL (ref 0–200)
HDL: 41.2 mg/dL (ref >39.00)
LDL Cholesterol: 120 mg/dL — ABNORMAL HIGH (ref 0–99)
NonHDL: 130.77
Triglycerides: 56 mg/dL (ref 0.0–149.0)
VLDL: 11.2 mg/dL (ref 0.0–40.0)

## 2018-01-23 LAB — HEPATIC FUNCTION PANEL
ALK PHOS: 87 U/L (ref 39–117)
ALT: 12 U/L (ref 0–53)
AST: 13 U/L (ref 0–37)
Albumin: 4.1 g/dL (ref 3.5–5.2)
BILIRUBIN DIRECT: 0.1 mg/dL (ref 0.0–0.3)
TOTAL PROTEIN: 7.4 g/dL (ref 6.0–8.3)
Total Bilirubin: 0.7 mg/dL (ref 0.2–1.2)

## 2018-01-23 LAB — BASIC METABOLIC PANEL
BUN: 16 mg/dL (ref 6–23)
CALCIUM: 9.7 mg/dL (ref 8.4–10.5)
CO2: 30 meq/L (ref 19–32)
Chloride: 101 mEq/L (ref 96–112)
Creatinine, Ser: 0.86 mg/dL (ref 0.40–1.50)
GFR: 112.15 mL/min (ref >60.00)
Glucose, Bld: 99 mg/dL (ref 70–99)
Potassium: 3.9 mEq/L (ref 3.5–5.1)
SODIUM: 137 meq/L (ref 135–145)

## 2018-01-23 LAB — HEMOGLOBIN A1C: Hgb A1c MFr Bld: 5.6 % (ref 4.6–6.5)

## 2018-01-23 LAB — TSH: TSH: 1.2 u[IU]/mL (ref 0.35–4.50)

## 2018-01-23 LAB — PSA: PSA: 3.44 ng/mL (ref 0.10–4.00)

## 2018-01-23 MED ORDER — HYDROCHLOROTHIAZIDE 25 MG PO TABS: 25.0000 mg | ORAL_TABLET | Freq: Every day | ORAL | 3 refills | Status: DC

## 2018-01-23 MED ORDER — OMEPRAZOLE 40 MG PO CPDR: 40.0000 mg | DELAYED_RELEASE_CAPSULE | Freq: Every day | ORAL | 3 refills | Status: DC

## 2018-01-23 MED ORDER — AMLODIPINE BESYLATE 10 MG PO TABS: 10.0000 mg | ORAL_TABLET | Freq: Every day | ORAL | 2 refills | Status: DC

## 2018-01-23 MED ORDER — LOSARTAN POTASSIUM 50 MG PO TABS: 50.0000 mg | ORAL_TABLET | Freq: Every day | ORAL | 3 refills | Status: DC

## 2018-01-23 NOTE — Patient Instructions (Signed)
It was great seeing you today   Please continue to work on weight loss through diet and exercise   I will follow up with you regarding your blood work   Someone will call you to schedule your colonoscopy. - Please let me know if you do not hear anything within the next two weeks

## 2018-01-23 NOTE — Progress Notes (Signed)
Subjective:    Patient ID: [name], male    DOB: [birth year], 72 y.o.   MRN: [id number]  HPI Patient presents for yearly preventative medicine examination. He is a pleasant 72 year old male who  has a past medical history of Arthritis, Complication of anesthesia, Hypertension, and Neuromuscular disorder ([place]).  Essential Hypertension - Takes Norvasc 10 mg, hydrochlorothiazide 25 m, and Cozaar 100 mg daily BP Readings from Last 3 Encounters:  01/23/18 122/60  08/07/17 140/70  07/12/17 130/80   OSA - Is followed by Pulmonary - uses CPAP. Reports getting restful sleep when he uses it   Chronic Cough - Feels as though this is improving. He has been using Prilosec and he noticed improvement when he takes this medication  All immunizations and health maintenance protocols were reviewed with the patient and needed orders were placed. Need seasonal flu   Appropriate screening laboratory values were ordered for the patient including screening of hyperlipidemia, renal function and hepatic function. If indicated by BPH, a PSA was ordered.  Medication reconciliation,  past medical history, social history, problem list and allergies were reviewed in detail with the patient  Goals were established with regard to weight loss, exercise, and  diet in compliance with medications. He is exercising at the [facility] three days a week. He has been eating a more heart healthy diet and going out to eat less.   Wt Readings from Last 3 Encounters:  01/23/18 270 lb (122.5 kg)  08/07/17 277 lb (125.6 kg)  07/12/17 276 lb 9.6 oz (125.5 kg)   He is due for colonoscopy. Has upcoming dental and vision screens.   He has no acute complaints today    Review of Systems  Constitutional: Negative.   HENT: Negative.   Eyes: Negative.   Respiratory: Negative.   Cardiovascular: Negative.   Gastrointestinal: Negative.   Endocrine: Negative.   Genitourinary: Negative.   Musculoskeletal: Positive for  arthralgias.  Skin: Negative.   Allergic/Immunologic: Negative.   Neurological: Negative.   Hematological: Negative.   Psychiatric/Behavioral: Negative.   All other systems reviewed and are negative.  Past Medical History:  Diagnosis Date  . Arthritis   . Complication of anesthesia   . Hypertension   . Neuromuscular disorder ([place])    pinched nerve in foot    Social History   Socioeconomic History  . Marital status: Married    Spouse name: Not on file  . Number of children: Not on file  . Years of education: Not on file  . Highest education level: Not on file  Occupational History  . Not on file  Social Needs  . Financial resource strain: Not on file  . Food insecurity:    Worry: Not on file    Inability: Not on file  . Transportation needs:    Medical: Not on file    Non-medical: Not on file  Tobacco Use  . Smoking status: Former Smoker    Last attempt to quit: 03/01/1967    Years since quitting: 50.9  . Smokeless tobacco: Never Used  . Tobacco comment: Pt states he was a social smoker  Substance and Sexual Activity  . Alcohol use: Yes    Comment: holidays  . Drug use: No  . Sexual activity: Not on file  Lifestyle  . Physical activity:    Days per week: Not on file    Minutes per session: Not on file  . Stress: Not on file  Relationships  . Social connections:  Talks on phone: Not on file    Gets together: Not on file    Attends religious service: Not on file    Active member of club or organization: Not on file    Attends meetings of clubs or organizations: Not on file    Relationship status: Not on file  . Intimate partner violence:    Fear of current or ex partner: Not on file    Emotionally abused: Not on file    Physically abused: Not on file    Forced sexual activity: Not on file  Other Topics Concern  . 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 CATARACT EXTRACTION Right   . JOINT REPLACEMENT     bilateral knee replacenent  . TOTAL KNEE REVISION  05/30/2011   Procedure: TOTAL KNEE REVISION;  Surgeon: Meredith Pel, MD;  Location: [facility];  Service: Orthopedics;  Laterality: Right;  Right total knee arthroplasty polyethylene spacer exchange    Family History  Problem Relation Age of Onset  . Diabetes Sister   . Stroke Sister     Allergies  Allergen Reactions  . Lisinopril Cough    Current Outpatient Medications on File Prior to Visit  Medication Sig Dispense Refill  . amLODipine (NORVASC) 10 MG tablet Take 1 tablet (10 mg total) by mouth daily. 90 tablet 2  . aspirin 81 MG tablet Take 81 mg by mouth daily.    . hydrochlorothiazide (HYDRODIURIL) 25 MG tablet Take 1 tablet (25 mg total) by mouth daily. 90 tablet 2  . losartan (COZAAR) 50 MG tablet TAKE 1 TABLET BY MOUTH EVERY DAY 90 tablet 0   No current facility-administered medications on file prior to visit.     BP 122/60   Temp 98.4 F (36.9 C) (Oral)   Ht 5\' 9"  (1.753 m)   Wt 270 lb (122.5 kg)   BMI 39.87 kg/m       Objective:   Physical Exam  Constitutional: He is oriented to person, place, and time. He appears well-developed and well-nourished. No distress.  Obese    HENT:  Head: Normocephalic and atraumatic.  Right Ear: External ear normal.  Left Ear: External ear normal.  Nose: Nose normal.  Mouth/Throat: Oropharynx is clear and moist. No oropharyngeal exudate.  Eyes: Pupils are equal, round, and reactive to light. Conjunctivae and EOM are normal. Right eye exhibits no discharge. Left eye exhibits no discharge. No scleral icterus.  Neck: Normal range of motion. Neck supple. No JVD present. No tracheal deviation present. No thyromegaly present.  Cardiovascular: Normal rate, regular rhythm, normal heart sounds and intact distal pulses. Exam reveals no gallop and no friction rub.  No murmur  heard. Pulmonary/Chest: Effort normal and breath sounds normal. No stridor. No respiratory distress. He has no wheezes. He has no rales. He exhibits no tenderness.  Abdominal: Soft. Bowel sounds are normal. He exhibits no distension and no mass. There is no tenderness. There is no rebound and no guarding. No hernia.  Musculoskeletal: Normal range of motion. He exhibits no edema, tenderness or deformity.  Lymphadenopathy:    He has no cervical adenopathy.  Neurological: He is alert and oriented to person, place, and time. He displays normal reflexes. No cranial nerve deficit or sensory deficit. He exhibits normal muscle tone. Coordination normal.  Skin: Skin  is warm and dry. No rash noted. He is not diaphoretic. No erythema. No pallor.  Psychiatric: He has a normal mood and affect. His behavior is normal. Judgment and thought content normal.  Nursing note and vitals reviewed.     Assessment & Plan:  1. Routine general medical examination at a health care facility - Congratulated on weight loss.  - Continue to exercise and eat healthy.  - Follow up in one year or sooner if needed - Basic metabolic panel - CBC with Differential/Platelet - Hemoglobin A1c - Hepatic function panel - Lipid panel - TSH  2. Essential hypertension - Well controlled. No changes in medication  - hydrochlorothiazide (HYDRODIURIL) 25 MG tablet; Take 1 tablet (25 mg total) by mouth daily.  Dispense: 90 tablet; Refill: 3 - losartan (COZAAR) 50 MG tablet; Take 1 tablet (50 mg total) by mouth daily.  Dispense: 90 tablet; Refill: 3 - amLODipine (NORVASC) 10 MG tablet; Take 1 tablet (10 mg total) by mouth daily.  Dispense: 90 tablet; Refill: 2 - Basic metabolic panel - CBC with Differential/Platelet - Hemoglobin A1c - Hepatic function panel - Lipid panel - TSH  3. Medication refill  - omeprazole (PRILOSEC) 40 MG capsule; Take 1 capsule (40 mg total) by mouth daily.  Dispense: 90 capsule; Refill: 3 -  hydrochlorothiazide (HYDRODIURIL) 25 MG tablet; Take 1 tablet (25 mg total) by mouth daily.  Dispense: 90 tablet; Refill: 3 - losartan (COZAAR) 50 MG tablet; Take 1 tablet (50 mg total) by mouth daily.  Dispense: 90 tablet; Refill: 3 - amLODipine (NORVASC) 10 MG tablet; Take 1 tablet (10 mg total) by mouth daily.  Dispense: 90 tablet; Refill: 2  4. Prostate cancer screening  - PSA  5. Colon cancer screening  - Ambulatory referral to Gastroenterology  6. Need for influenza vaccination  - Flu vaccine HIGH DOSE PF (Fluzone High dose)  [name], NP

## 2018-01-31 ENCOUNTER — Other Ambulatory Visit: Payer: Self-pay

## 2018-01-31 MED ORDER — SIMVASTATIN 5 MG PO TABS: 5.0000 mg | ORAL_TABLET | Freq: Every day | ORAL | 2 refills | Status: DC

## 2018-02-07 ENCOUNTER — Encounter: Payer: Self-pay | Admitting: Gastroenterology

## 2018-02-18 DIAGNOSIS — G4733 Obstructive sleep apnea (adult) (pediatric): Secondary | ICD-10-CM | POA: Diagnosis not present

## 2018-03-13 ENCOUNTER — Ambulatory Visit (AMBULATORY_SURGERY_CENTER): Payer: Self-pay | Admitting: *Deleted

## 2018-03-13 ENCOUNTER — Encounter: Payer: Self-pay | Admitting: Gastroenterology

## 2018-03-13 VITALS — Ht 69.0 in | Wt 270.0 lb

## 2018-03-13 DIAGNOSIS — Z1211 Encounter for screening for malignant neoplasm of colon: Secondary | ICD-10-CM

## 2018-03-13 MED ORDER — PEG 3350-KCL-NA BICARB-NACL 420 G PO SOLR: 4000.0000 mL | Freq: Once | ORAL | 0 refills | Status: AC

## 2018-03-13 NOTE — Progress Notes (Signed)
No egg or soy allergy known to patient  No issues with past sedation with any surgeries  or procedures, no intubation problems  No diet pills per patient No home 02 use per patient  No blood thinners per patient  Pt denies issues with constipation  No A fib or A flutter  EMMI video sent to pt's e mail - no e mail per pt.

## 2018-03-21 DIAGNOSIS — G4733 Obstructive sleep apnea (adult) (pediatric): Secondary | ICD-10-CM | POA: Diagnosis not present

## 2018-03-27 ENCOUNTER — Ambulatory Visit (AMBULATORY_SURGERY_CENTER): Payer: Medicare HMO | Admitting: Gastroenterology

## 2018-03-27 ENCOUNTER — Encounter: Payer: Self-pay | Admitting: Gastroenterology

## 2018-03-27 VITALS — BP 105/60 | HR 57 | Temp 98.0°F | Resp 17 | Ht 69.0 in | Wt 270.0 lb

## 2018-03-27 DIAGNOSIS — Z1211 Encounter for screening for malignant neoplasm of colon: Secondary | ICD-10-CM

## 2018-03-27 DIAGNOSIS — D123 Benign neoplasm of transverse colon: Secondary | ICD-10-CM | POA: Diagnosis not present

## 2018-03-27 DIAGNOSIS — D122 Benign neoplasm of ascending colon: Secondary | ICD-10-CM

## 2018-03-27 MED ORDER — SODIUM CHLORIDE 0.9 % IV SOLN: 500.0000 mL | Freq: Once | INTRAVENOUS | Status: DC

## 2018-03-27 NOTE — Progress Notes (Signed)
To PACU, VSS. Report to [email or web address] 

## 2018-03-27 NOTE — Progress Notes (Signed)
Called to room to assist during endoscopic procedure.  Patient ID and intended procedure confirmed with present staff. Received instructions for my participation in the procedure from the performing physician.  

## 2018-03-27 NOTE — Op Note (Signed)
[facility] Patient Name: [name] Procedure Date: 03/27/2018 11:18 AM MRN: [id number] Endoscopist: Milus Banister , MD Age: 72 Referring MD:  Date of Birth: 17-Feb-1946 Gender: Male Account #: [id number] Procedure:                Colonoscopy Indications:              Screening for colorectal malignant neoplasm Medicines:                Monitored Anesthesia Care Procedure:                Pre-Anesthesia Assessment:                           - Prior to the procedure, a History and Physical                            was performed, and patient medications and                            allergies were reviewed. The patient's tolerance of                            previous anesthesia was also reviewed. The risks                            and benefits of the procedure and the sedation                            options and risks were discussed with the patient.                            All questions were answered, and informed consent                            was obtained. Prior Anticoagulants: The patient has                            taken no previous anticoagulant or antiplatelet                            agents. ASA Grade Assessment: II - A patient with                            mild systemic disease. After reviewing the risks                            and benefits, the patient was deemed in                            satisfactory condition to undergo the procedure.                           After obtaining informed consent, the colonoscope  was passed under direct vision. Throughout the                            procedure, the patient's blood pressure, pulse, and                            oxygen saturations were monitored continuously. The                            Colonoscope was introduced through the anus and                            advanced to the the cecum, identified by                            appendiceal orifice and  ileocecal valve. The                            colonoscopy was performed without difficulty. The                            patient tolerated the procedure well. The quality                            of the bowel preparation was good. The ileocecal                            valve, appendiceal orifice, and rectum were                            photographed. Scope In: 11:24:53 AM Scope Out: 11:38:12 AM Scope Withdrawal Time: 0 hours 10 minutes 38 seconds  Total Procedure Duration: 0 hours 13 minutes 19 seconds  Findings:                 A 12 mm polyp was found in the ascending colon. The                            polyp was semi-pedunculated. The polyp was removed                            with a hot snare. Resection and retrieval were                            complete.                           Two sessile polyps were found in the transverse                            colon and ascending colon. The polyps were 2 to 4                            mm in size. These polyps were removed with a cold  snare. Resection and retrieval were complete.                           The exam was otherwise without abnormality on                            direct and retroflexion views. Complications:            No immediate complications. Estimated blood loss:                            None. Estimated Blood Loss:     Estimated blood loss: none. Impression:               - One 12 mm polyp in the ascending colon, removed                            with a hot snare. Resected and retrieved.                           - Two 2 to 4 mm polyps in the transverse colon and                            in the ascending colon, removed with a cold snare.                            Resected and retrieved.                           - The examination was otherwise normal on direct                            and retroflexion views. Recommendation:           - Patient has a contact number  available for                            emergencies. The signs and symptoms of potential                            delayed complications were discussed with the                            patient. Return to normal activities tomorrow.                            Written discharge instructions were provided to the                            patient.                           - Resume previous diet.                           - Continue present medications.  You will receive a letter within 2-3 weeks with the                            pathology results and my final recommendations.                           If the polyp(s) is proven to be 'pre-cancerous' on                            pathology, you will need repeat colonoscopy in 3-5                            years. If the polyp(s) is NOT 'precancerous' on                            pathology then you should repeat colon cancer                            screening in 10 years with colonoscopy without need                            for colon cancer screening by any method prior to                            then (including stool testing). Milus Banister, MD 03/27/2018 11:41:44 AM This report has been signed electronically.

## 2018-03-27 NOTE — Patient Instructions (Signed)
Handouts provided:  Polyps  YOU HAD AN ENDOSCOPIC PROCEDURE TODAY AT THE [facility]:   Refer to the procedure report that was given to you for any specific questions about what was found during the examination.  If the procedure report does not answer your questions, please call your gastroenterologist to clarify.  If you requested that your care partner not be given the details of your procedure findings, then the procedure report has been included in a sealed envelope for you to review at your convenience later.  YOU SHOULD EXPECT: Some feelings of bloating in the abdomen. Passage of more gas than usual.  Walking can help get rid of the air that was put into your GI tract during the procedure and reduce the bloating. If you had a lower endoscopy (such as a colonoscopy or flexible sigmoidoscopy) you may notice spotting of blood in your stool or on the toilet paper. If you underwent a bowel prep for your procedure, you may not have a normal bowel movement for a few days.  Please Note:  You might notice some irritation and congestion in your nose or some drainage.  This is from the oxygen used during your procedure.  There is no need for concern and it should clear up in a day or so.  SYMPTOMS TO REPORT IMMEDIATELY:   Following lower endoscopy (colonoscopy or flexible sigmoidoscopy):  Excessive amounts of blood in the stool  Significant tenderness or worsening of abdominal pains  Swelling of the abdomen that is new, acute  Fever of 100F or higher  For urgent or emergent issues, a gastroenterologist can be reached at any hour by calling [phone].   DIET:  We do recommend a small meal at first, but then you may proceed to your regular diet.  Drink plenty of fluids but you should avoid alcoholic beverages for 24 hours.  ACTIVITY:  You should plan to take it easy for the rest of today and you should NOT DRIVE or use heavy machinery until tomorrow (because of the sedation  medicines used during the test).    FOLLOW UP: Our staff will call the number listed on your records the next business day following your procedure to check on you and address any questions or concerns that you may have regarding the information given to you following your procedure. If we do not reach you, we will leave a message.  However, if you are feeling well and you are not experiencing any problems, there is no need to return our call.  We will assume that you have returned to your regular daily activities without incident.  If any biopsies were taken you will be contacted by phone or by letter within the next 1-3 weeks.  Please call us at [phone] if you have not heard about the biopsies in 3 weeks.    SIGNATURES/CONFIDENTIALITY: You and/or your care partner have signed paperwork which will be entered into your electronic medical record.  These signatures attest to the fact that that the information above on your After Visit Summary has been reviewed and is understood.  Full responsibility of the confidentiality of this discharge information lies with you and/or your care-partner.  

## 2018-03-28 ENCOUNTER — Telehealth: Payer: Self-pay | Admitting: *Deleted

## 2018-03-28 NOTE — Telephone Encounter (Signed)
  Follow up Call-  Call back number 03/27/2018  Post procedure Call Back phone  # [phone]  Permission to leave phone message Yes  Some recent data might be hidden     Patient questions:  Do you have a fever, pain , or abdominal swelling? No. Pain Score  0 *  Have you tolerated food without any problems? Yes.    Have you been able to return to your normal activities? Yes.    Do you have any questions about your discharge instructions: Diet   No. Medications  No. Follow up visit  No.  Do you have questions or concerns about your Care? No.  Actions: * If pain score is 4 or above: No action needed, pain <4.

## 2018-04-02 ENCOUNTER — Encounter: Payer: Self-pay | Admitting: Gastroenterology

## 2018-04-23 ENCOUNTER — Other Ambulatory Visit: Payer: Self-pay | Admitting: Adult Health

## 2018-04-23 NOTE — Telephone Encounter (Signed)
Sent to the pharmacy by e-scribe. 

## 2018-06-04 DIAGNOSIS — I10 Essential (primary) hypertension: Secondary | ICD-10-CM | POA: Diagnosis not present

## 2018-06-04 DIAGNOSIS — Z87891 Personal history of nicotine dependence: Secondary | ICD-10-CM | POA: Diagnosis not present

## 2018-06-04 DIAGNOSIS — Z6841 Body Mass Index (BMI) 40.0 and over, adult: Secondary | ICD-10-CM | POA: Diagnosis not present

## 2018-06-04 DIAGNOSIS — E785 Hyperlipidemia, unspecified: Secondary | ICD-10-CM | POA: Diagnosis not present

## 2018-06-04 DIAGNOSIS — G4730 Sleep apnea, unspecified: Secondary | ICD-10-CM | POA: Diagnosis not present

## 2018-06-04 DIAGNOSIS — Z7982 Long term (current) use of aspirin: Secondary | ICD-10-CM | POA: Diagnosis not present

## 2018-06-04 DIAGNOSIS — K219 Gastro-esophageal reflux disease without esophagitis: Secondary | ICD-10-CM | POA: Diagnosis not present

## 2018-09-03 DIAGNOSIS — G4733 Obstructive sleep apnea (adult) (pediatric): Secondary | ICD-10-CM | POA: Diagnosis not present

## 2018-12-03 DIAGNOSIS — G4733 Obstructive sleep apnea (adult) (pediatric): Secondary | ICD-10-CM | POA: Diagnosis not present

## 2018-12-20 ENCOUNTER — Other Ambulatory Visit: Payer: Self-pay | Admitting: Adult Health

## 2018-12-20 DIAGNOSIS — Z760 Encounter for issue of repeat prescription: Secondary | ICD-10-CM

## 2018-12-20 DIAGNOSIS — I10 Essential (primary) hypertension: Secondary | ICD-10-CM

## 2018-12-20 NOTE — Telephone Encounter (Signed)
Sent to the pharmacy by e-scribe for 90 days. 

## 2019-01-03 DIAGNOSIS — G4733 Obstructive sleep apnea (adult) (pediatric): Secondary | ICD-10-CM | POA: Diagnosis not present

## 2019-01-08 ENCOUNTER — Other Ambulatory Visit: Payer: Self-pay | Admitting: Adult Health

## 2019-01-08 DIAGNOSIS — I10 Essential (primary) hypertension: Secondary | ICD-10-CM

## 2019-01-08 DIAGNOSIS — Z760 Encounter for issue of repeat prescription: Secondary | ICD-10-CM

## 2019-01-10 ENCOUNTER — Other Ambulatory Visit: Payer: Self-pay | Admitting: Adult Health

## 2019-01-10 ENCOUNTER — Encounter: Payer: Self-pay | Admitting: Family Medicine

## 2019-01-10 DIAGNOSIS — I10 Essential (primary) hypertension: Secondary | ICD-10-CM

## 2019-01-10 DIAGNOSIS — Z760 Encounter for issue of repeat prescription: Secondary | ICD-10-CM

## 2019-01-10 NOTE — Telephone Encounter (Signed)
Sent to the pharmacy by e-scribe for 90 days.  Was going to send a letter to make appt for cpx.  No valid address on file.

## 2019-01-14 NOTE — Telephone Encounter (Signed)
PHARMACY NEEDED DX CODE.  SENT BY FAX.  NOTHING FURTHER.

## 2019-01-19 ENCOUNTER — Other Ambulatory Visit: Payer: Self-pay | Admitting: Adult Health

## 2019-01-19 DIAGNOSIS — I10 Essential (primary) hypertension: Secondary | ICD-10-CM

## 2019-01-19 DIAGNOSIS — Z760 Encounter for issue of repeat prescription: Secondary | ICD-10-CM

## 2019-01-21 NOTE — Telephone Encounter (Signed)
Sent to the pharmacy by e-scribe for 90 days.  Pt has upcoming cpx. 

## 2019-02-03 DIAGNOSIS — G4733 Obstructive sleep apnea (adult) (pediatric): Secondary | ICD-10-CM | POA: Diagnosis not present

## 2019-02-19 ENCOUNTER — Other Ambulatory Visit: Payer: Self-pay

## 2019-02-19 ENCOUNTER — Ambulatory Visit (INDEPENDENT_AMBULATORY_CARE_PROVIDER_SITE_OTHER): Payer: Medicare HMO

## 2019-02-19 DIAGNOSIS — Z23 Encounter for immunization: Secondary | ICD-10-CM

## 2019-03-03 DIAGNOSIS — G4733 Obstructive sleep apnea (adult) (pediatric): Secondary | ICD-10-CM | POA: Diagnosis not present

## 2019-03-18 ENCOUNTER — Encounter: Payer: Medicare HMO | Admitting: Adult Health

## 2019-04-01 ENCOUNTER — Other Ambulatory Visit: Payer: Self-pay | Admitting: Adult Health

## 2019-04-01 DIAGNOSIS — Z760 Encounter for issue of repeat prescription: Secondary | ICD-10-CM

## 2019-04-01 DIAGNOSIS — I10 Essential (primary) hypertension: Secondary | ICD-10-CM

## 2019-04-02 NOTE — Telephone Encounter (Signed)
Sent to the pharmacy by e-scribe for 90 days.  Pt has upcoming cpx. 

## 2019-04-03 ENCOUNTER — Telehealth (INDEPENDENT_AMBULATORY_CARE_PROVIDER_SITE_OTHER): Payer: Medicare HMO | Admitting: Family Medicine

## 2019-04-03 ENCOUNTER — Other Ambulatory Visit: Payer: Self-pay

## 2019-04-03 DIAGNOSIS — J029 Acute pharyngitis, unspecified: Secondary | ICD-10-CM | POA: Diagnosis not present

## 2019-04-03 DIAGNOSIS — R059 Cough, unspecified: Secondary | ICD-10-CM

## 2019-04-03 DIAGNOSIS — R05 Cough: Secondary | ICD-10-CM | POA: Diagnosis not present

## 2019-04-03 DIAGNOSIS — G4733 Obstructive sleep apnea (adult) (pediatric): Secondary | ICD-10-CM | POA: Diagnosis not present

## 2019-04-03 DIAGNOSIS — H9209 Otalgia, unspecified ear: Secondary | ICD-10-CM

## 2019-04-03 MED ORDER — BENZONATATE 100 MG PO CAPS: 100.0000 mg | ORAL_CAPSULE | Freq: Three times a day (TID) | ORAL | 0 refills | Status: DC | PRN

## 2019-04-03 NOTE — Progress Notes (Signed)
Virtual Visit via Telephone Note  I connected with [name] on 04/03/19 at  1:00 PM EST by telephone and verified that I am speaking with the correct person using two identifiers. Denies any access to Internet.   I discussed the limitations, risks, security and privacy concerns of performing an evaluation and management service by telephone and the availability of in person appointments. I also discussed with the patient that there may be a patient responsible charge related to this service. The patient expressed understanding and agreed to proceed.  Location patient: home Location provider: work or home office Participants present for the call: patient, provider Patient did not have a visit in the prior 7 days to address this/these issue(s).   History of Present Illness:   Acute visit for cough and sore throat: -started 2 days ago after raking leaves -symptoms include: mild sore throat, cough, som ear discomfort intermittently -denies:fevers, malaise, loss of taste, wheezing, sob, NVD, body aches, sick contacts or any exposures to others outside the home in the last 2 weeks. Reports wears mask and shield to get groceries and is brief in those trips and avoids others. -has had issues with allergies in the past in the fall when doing leaves  Observations/Objective: Patient sounds cheerful and well on the phone. I do not appreciate any SOB. Speech and thought processing are grossly intact. Patient reported vitals:  Assessment and Plan:  Cough  Discomfort of ear, unspecified laterality  Sore throat  -we discussed possible serious and likely etiologies, options for evaluation and workup, limitations of telemedicine visit vs in person visit, treatment, treatment risks and precautions. Pt prefers to treat via telemedicine empirically rather then risking or undertaking an in person visit at this moment. Suspect allergic rhinitis with PND and ETD most likely. Discussed other  potential etiologies and patient agrees to seek prompt in person care if worsening, new symptoms arise, or if is not improving with treatment over the next 1-2 days. Declines COVID19 testing for now - discussed options and limitations. Is already practicing isolation and therefore CVOID19 is less likely.He opted to try tessalon for cough, claritin once daily and nasal saline.   Follow Up Instructions:   I did not refer this patient for an OV in the next 24 hours for this/these issue(s).  I discussed the assessment and treatment plan with the patient. The patient was provided an opportunity to ask questions and all were answered. The patient agreed with the plan and demonstrated an understanding of the instructions.   The patient was advised to call back or seek an in-person evaluation if the symptoms worsen or if the condition fails to improve as anticipated.  I provided 14 minutes of non-face-to-face time during this encounter.   Lucretia Kern, DO

## 2019-04-19 ENCOUNTER — Other Ambulatory Visit: Payer: Self-pay | Admitting: Adult Health

## 2019-04-19 DIAGNOSIS — Z760 Encounter for issue of repeat prescription: Secondary | ICD-10-CM

## 2019-04-19 DIAGNOSIS — I10 Essential (primary) hypertension: Secondary | ICD-10-CM

## 2019-04-22 NOTE — Telephone Encounter (Signed)
Sent to the pharmacy by e-scribe for 90 days.  Pt has upcoming cpx. 

## 2019-05-01 ENCOUNTER — Telehealth: Payer: Self-pay | Admitting: *Deleted

## 2019-05-01 ENCOUNTER — Ambulatory Visit: Payer: Medicare HMO

## 2019-05-01 NOTE — Telephone Encounter (Signed)
Noted  

## 2019-05-01 NOTE — Telephone Encounter (Signed)
Copied from [place] [id number]. Topic: General - Other >> May 01, 2019 10:29 AM Rainey Pines A wrote: Patien returned health coaches call today in regards to AWV for today and be reached today before noon. Please advise

## 2019-05-03 ENCOUNTER — Other Ambulatory Visit: Payer: Self-pay | Admitting: Adult Health

## 2019-05-03 DIAGNOSIS — G4733 Obstructive sleep apnea (adult) (pediatric): Secondary | ICD-10-CM | POA: Diagnosis not present

## 2019-05-07 NOTE — Telephone Encounter (Signed)
Sent to the pharmacy by e-scribe for 90 days.  Has upcoming cpx.

## 2019-05-16 ENCOUNTER — Encounter (HOSPITAL_COMMUNITY): Payer: Self-pay | Admitting: Emergency Medicine

## 2019-05-16 ENCOUNTER — Ambulatory Visit (HOSPITAL_COMMUNITY)
Admission: EM | Admit: 2019-05-16 | Discharge: 2019-05-16 | Disposition: A | Discharge status: Home / Self Care | Payer: Medicare HMO | Attending: Family Medicine | Admitting: Family Medicine

## 2019-05-16 ENCOUNTER — Other Ambulatory Visit: Payer: Self-pay

## 2019-05-16 DIAGNOSIS — R059 Cough, unspecified: Secondary | ICD-10-CM

## 2019-05-16 DIAGNOSIS — Z79899 Other long term (current) drug therapy: Secondary | ICD-10-CM | POA: Insufficient documentation

## 2019-05-16 DIAGNOSIS — U071 COVID-19: Secondary | ICD-10-CM | POA: Insufficient documentation

## 2019-05-16 DIAGNOSIS — Z7982 Long term (current) use of aspirin: Secondary | ICD-10-CM | POA: Diagnosis not present

## 2019-05-16 DIAGNOSIS — Z20822 Contact with and (suspected) exposure to covid-19: Secondary | ICD-10-CM | POA: Diagnosis not present

## 2019-05-16 DIAGNOSIS — Z888 Allergy status to other drugs, medicaments and biological substances status: Secondary | ICD-10-CM | POA: Diagnosis not present

## 2019-05-16 DIAGNOSIS — R05 Cough: Secondary | ICD-10-CM

## 2019-05-16 DIAGNOSIS — Z87891 Personal history of nicotine dependence: Secondary | ICD-10-CM | POA: Insufficient documentation

## 2019-05-16 DIAGNOSIS — I10 Essential (primary) hypertension: Secondary | ICD-10-CM | POA: Diagnosis not present

## 2019-05-16 NOTE — Discharge Instructions (Signed)
Self isolate until covid results are back. If positive will need to isolate for at least 10 days from onset of symptoms.  Will notify you by phone of any positive findings. Your negative results will be sent through your MyChart. Push fluids to ensure adequate hydration and keep secretions thin.  Tylenol as needed. 500-1000mg  every 8 hours as needed.  Over the counter medications as needed for symptoms.  Rest.  If any worsening of shortness of breath , chest pain , or otherwise concerned please return.

## 2019-05-16 NOTE — ED Triage Notes (Signed)
Pt here for COVID exposure.... reports wife and granddaughter tested positive here at the [facility]  Sx today include: cough   Denies fevers, SOB, dyspnea  A&O x4... NAD.Marland Kitchen. ambulatory

## 2019-05-16 NOTE — ED Provider Notes (Signed)
[facility]    CSN: [id number] Arrival date & time: 05/16/19  1449      History   Chief Complaint Chief Complaint  Patient presents with  . URI    COVID exposure    HPI [name] is a 74 y.o. male.   [name] presents with complaints of occasional cough for the past two days. No shortness of breath, no chest pain , no fevers, no headache, no gi symptoms. His wife and his granddaughter tested positive for covid-19 earlier in the week. He is here with other family member also with cough, getting tested. Taking tylenol as needed. History of htn, gerd.    ROS per HPI, negative if not otherwise mentioned.      Past Medical History:  Diagnosis Date  . Arthritis   . Cataract    right eye removed   . Complication of anesthesia    "hard to wake up post op" per pt and wife   . GERD (gastroesophageal reflux disease)   . Hyperlipidemia   . Hypertension   . Neuromuscular disorder ([place])    pinched nerve in foot  . Sleep apnea    wears cpap     Patient Active Problem List   Diagnosis Date Noted  . OSA (obstructive sleep apnea) 04/16/2017  . 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 IMPAIRED GLUCOSE TOLERANCE 01/18/2010  . [place] DISEASE, CERVICAL 10/22/2008  . EXERCISE INDUCED BRONCHOSPASM 07/15/2007  . 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 DYSHIDROSIS 05/17/2007  . DEGENERATIVE JOINT DISEASE, BOTH KNEES, SEVERE 05/17/2007  . Essential hypertension 12/21/2006  . Disorder resulting from impaired renal function 12/21/2006    Past Surgical History:  Procedure Laterality Date  . CATARACT EXTRACTION Right   . COLONOSCOPY    . JOINT REPLACEMENT     bilateral knee replacenent  . TOTAL KNEE REVISION  05/30/2011   Procedure: TOTAL KNEE REVISION;  Surgeon: Meredith Pel, MD;  Location: [facility];  Service: Orthopedics;  Laterality: Right;  Right total knee arthroplasty polyethylene spacer exchange       Home  Medications    Prior to Admission medications   Medication Sig Start Date End Date Taking? Authorizing Provider  amLODipine (NORVASC) 10 MG tablet TAKE 1 TABLET BY MOUTH EVERY DAY 04/02/19   Nafziger, Tommi Rumps, NP  aspirin 81 MG tablet Take 81 mg by mouth daily.    [provider]  benzonatate (TESSALON PERLES) 100 MG capsule Take 1 capsule (100 mg total) by mouth 3 (three) times daily as needed. 04/03/19   Lucretia Kern, DO  hydrochlorothiazide (HYDRODIURIL) 25 MG tablet TAKE 1 TABLET BY MOUTH EVERY DAY 04/22/19   Nafziger, Tommi Rumps, NP  losartan (COZAAR) 25 MG tablet TAKE 2 TABLETS (50MG ) DAILY 04/22/19   Nafziger, Tommi Rumps, NP  omeprazole (PRILOSEC) 40 MG capsule TAKE 1 CAPSULE BY MOUTH EVERY DAY 04/22/19   Nafziger, Tommi Rumps, NP  simvastatin (ZOCOR) 5 MG tablet TAKE 1 TABLET BY MOUTH EVERYDAY AT BEDTIME 05/07/19   [name], NP    Family History Family History  Problem Relation Age of Onset  . Diabetes Sister   . Stroke Sister   . Colon cancer Neg Hx   . Colon polyps Neg Hx   . Esophageal cancer Neg Hx   . Rectal cancer Neg Hx   . Stomach cancer Neg Hx     Social History Social History   Tobacco Use  . Smoking status: Former Smoker  Quit date: 03/01/1967    Years since quitting: 52.2  . Smokeless tobacco: Never Used  . Tobacco comment: Pt states he was a social smoker  Substance Use Topics  . Alcohol use: Yes    Comment: holidays  . Drug use: No     Allergies   Lisinopril   Review of Systems Review of Systems   Physical Exam Triage Vital Signs ED Triage Vitals [05/16/19 1514]  Enc Vitals Group     BP 116/72     Pulse Rate 80     Resp 18     Temp 98.8 F (37.1 C)     Temp Source Oral     SpO2 100 %     Weight      Height      Head Circumference      Peak Flow      Pain Score 0     Pain Loc      Pain Edu?      Excl. in [place]?    No data found.  Updated Vital Signs BP 116/72 (BP Location: Left Arm)   Pulse 80   Temp 98.8 F (37.1 C) (Oral)   Resp  18   SpO2 100%    Physical Exam Constitutional:      Appearance: He is well-developed.  Cardiovascular:     Rate and Rhythm: Normal rate.  Pulmonary:     Effort: Pulmonary effort is normal.  Skin:    General: Skin is warm and dry.  Neurological:     Mental Status: He is alert and oriented to person, place, and time.      UC Treatments / Results  Labs (all labs ordered are listed, but only abnormal results are displayed) Labs Reviewed  NOVEL CORONAVIRUS, NAA (HOSP ORDER, SEND-OUT TO REF LAB; TAT 18-24 HRS)    EKG   Radiology No results found.  Procedures Procedures (including critical care time)  Medications Ordered in UC Medications - No data to display  Initial Impression / Assessment and Plan / UC Course  I have reviewed the triage vital signs and the nursing notes.  Pertinent labs & imaging results that were available during my care of the patient were reviewed by me and considered in my medical decision making (see chart for details).     Non toxic. Benign physical exam.  Afebrile. No shortness of breath , no chest pain . covid exposure and now with occasional cough. covid testing pending. Will notify of any positive findings and if any changes to treatment are needed.  Return precautions provided. Patient verbalized understanding and agreeable to plan.   Final Clinical Impressions(s) / UC Diagnoses   Final diagnoses:  Exposure to COVID-19 virus  Cough     Discharge Instructions     Self isolate until covid results are back. If positive will need to isolate for at least 10 days from onset of symptoms.  Will notify you by phone of any positive findings. Your negative results will be sent through your MyChart. Push fluids to ensure adequate hydration and keep secretions thin.  Tylenol as needed. 500-1000mg  every 8 hours as needed.  Over the counter medications as needed for symptoms.  Rest.  If any worsening of shortness of breath , chest pain , or  otherwise concerned please return.        ED Prescriptions    None     PDMP not reviewed this encounter.   Zigmund Gottron, NP 05/16/19 1900

## 2019-05-18 LAB — NOVEL CORONAVIRUS, NAA (HOSP ORDER, SEND-OUT TO REF LAB; TAT 18-24 HRS): SARS-CoV-2, NAA: DETECTED — AB

## 2019-05-19 ENCOUNTER — Ambulatory Visit (INDEPENDENT_AMBULATORY_CARE_PROVIDER_SITE_OTHER): Payer: Medicare HMO

## 2019-05-19 ENCOUNTER — Telehealth (HOSPITAL_COMMUNITY): Payer: Self-pay | Admitting: Emergency Medicine

## 2019-05-19 ENCOUNTER — Other Ambulatory Visit: Payer: Medicare HMO

## 2019-05-19 ENCOUNTER — Other Ambulatory Visit: Payer: Self-pay | Admitting: Nurse Practitioner

## 2019-05-19 VITALS — Temp 99.5°F | Ht 69.0 in | Wt 271.0 lb

## 2019-05-19 DIAGNOSIS — Z0000 Encounter for general adult medical examination without abnormal findings: Secondary | ICD-10-CM | POA: Diagnosis not present

## 2019-05-19 DIAGNOSIS — U071 COVID-19: Secondary | ICD-10-CM

## 2019-05-19 DIAGNOSIS — I10 Essential (primary) hypertension: Secondary | ICD-10-CM

## 2019-05-19 NOTE — Telephone Encounter (Signed)

## 2019-05-19 NOTE — Patient Instructions (Addendum)
[name] , Thank you for taking time to participate in your Medicare Wellness Visit. I appreciate your ongoing commitment to your health goals. Please review the following plan we discussed and let me know if I can assist you in the future.   Screening recommendations/referrals: Colorectal Screening: completed 03/27/2018; repeat in 3 years from last colonoscopy.  Vision and Dental Exams: Recommended annual ophthalmology exams for early detection of glaucoma and other disorders of the eye Recommended annual dental exams for proper oral hygiene  Diabetic Exams: Diabetic Eye Exam: N/A Diabetic Foot Exam: N/A  Vaccinations: Influenza vaccine: completed 02/19/2019; due again Fall 2021. Pneumococcal vaccine:  Tdap vaccine:completed 10/24/2012; repeat 10/25/2022 Shingles vaccine: Please call your pharmacy to determine your out of pocket expense for the Shingrix vaccine. You may receive this vaccine at your local pharmacy. This is a series of two vaccines and they are to be given 2-6 months apart.   Advanced directives: Advance directives discussed with you today. Please bring a copy of your POA (Power of [place]) and/or Living Will to your next appointment.  Goals: Recommend to drink at least 6-8 8oz glasses of water per day. Recommend to exercise for at least 150 minutes per week. Recommend to remove any items from the home that may cause slips or trips. Recommend to decrease portion sizes by eating 3 small healthy meals and at least 2 healthy snacks per day. Recommend to begin DASH diet (low sodium) as directed below  Next appointment: Please schedule your Annual Wellness Visit with your Nurse Health Advisor in one year.  Preventive Care 75 Years and Older, Male Preventive care refers to lifestyle choices and visits with your health care provider that can promote health and wellness. What does preventive care include?  A yearly physical exam. This is also called an annual well check.   Dental exams once or twice a year.  Routine eye exams. Ask your health care provider how often you should have your eyes checked.  Personal lifestyle choices, including:  Daily care of your teeth and gums.  Regular physical activity.  Eating a healthy diet.  Avoiding tobacco and drug use.  Limiting alcohol use.  Practicing safe sex.  Taking low doses of aspirin every day if recommended by your health care provider..  Taking vitamin and mineral supplements as recommended by your health care provider. What happens during an annual well check? The services and screenings done by your health care provider during your annual well check will depend on your age, overall health, lifestyle risk factors, and family history of disease. Counseling  Your health care provider may ask you questions about your:  Alcohol use.  Tobacco use.  Drug use.  Emotional well-being.  Home and relationship well-being.  Sexual activity.  Eating habits.  History of falls.  Memory and ability to understand (cognition).  Work and work Statistician. Screening  You may have the following tests or measurements:  Height, weight, and BMI.  Blood pressure.  Lipid and cholesterol levels. These may be checked every 5 years, or more frequently if you are over 18 years old.  Skin check.  Lung cancer screening. You may have this screening every year starting at age 75 if you have a 30-pack-year history of smoking and currently smoke or have quit within the past 15 years.  Fecal occult blood test (FOBT) of the stool. You may have this test every year starting at age 75.  Flexible sigmoidoscopy or colonoscopy. You may have a sigmoidoscopy every 5  years or a colonoscopy every 10 years starting at age 75.  Prostate cancer screening. Recommendations will vary depending on your family history and other risks.  Hepatitis C blood test.  Hepatitis B blood test.  Sexually transmitted disease (STD)  testing.  Diabetes screening. This is done by checking your blood sugar (glucose) after you have not eaten for a while (fasting). You may have this done every 1-3 years.  Abdominal aortic aneurysm (AAA) screening. You may need this if you are a current or former smoker.  Osteoporosis. You may be screened starting at age 75 if you are at high risk. Talk with your health care provider about your test results, treatment options, and if necessary, the need for more tests. Vaccines  Your health care provider may recommend certain vaccines, such as:  Influenza vaccine. This is recommended every year.  Tetanus, diphtheria, and acellular pertussis (Tdap, Td) vaccine. You may need a Td booster every 10 years.  Zoster vaccine. You may need this after age 65.  Pneumococcal 13-valent conjugate (PCV13) vaccine. One dose is recommended after age 75.  Pneumococcal polysaccharide (PPSV23) vaccine. One dose is recommended after age 75. Talk to your health care provider about which screenings and vaccines you need and how often you need them. This information is not intended to replace advice given to you by your health care provider. Make sure you discuss any questions you have with your health care provider. Document Released: 05/28/2015 Document Revised: 01/19/2016 Document Reviewed: 03/02/2015 Elsevier Interactive Patient Education  2017 [place] Prevention in the Home Falls can cause injuries. They can happen to people of all ages. There are many things you can do to make your home safe and to help prevent falls. What can I do on the outside of my home?  Regularly fix the edges of walkways and driveways and fix any cracks.  Remove anything that might make you trip as you walk through a door, such as a raised step or threshold.  Trim any bushes or trees on the path to your home.  Use bright outdoor lighting.  Clear any walking paths of anything that might make someone trip, such as  rocks or tools.  Regularly check to see if handrails are loose or broken. Make sure that both sides of any steps have handrails.  Any raised decks and porches should have guardrails on the edges.  Have any leaves, snow, or ice cleared regularly.  Use sand or salt on walking paths during winter.  Clean up any spills in your garage right away. This includes oil or grease spills. What can I do in the bathroom?  Use night lights.  Install grab bars by the toilet and in the tub and shower. Do not use towel bars as grab bars.  Use non-skid mats or decals in the tub or shower.  If you need to sit down in the shower, use a plastic, non-slip stool.  Keep the floor dry. Clean up any water that spills on the floor as soon as it happens.  Remove soap buildup in the tub or shower regularly.  Attach bath mats securely with double-sided non-slip rug tape.  Do not have throw rugs and other things on the floor that can make you trip. What can I do in the bedroom?  Use night lights.  Make sure that you have a light by your bed that is easy to reach.  Do not use any sheets or blankets that are too big for  your bed. They should not hang down onto the floor.  Have a firm chair that has side arms. You can use this for support while you get dressed.  Do not have throw rugs and other things on the floor that can make you trip. What can I do in the kitchen?  Clean up any spills right away.  Avoid walking on wet floors.  Keep items that you use a lot in easy-to-reach places.  If you need to reach something above you, use a strong step stool that has a grab bar.  Keep electrical cords out of the way.  Do not use floor polish or wax that makes floors slippery. If you must use wax, use non-skid floor wax.  Do not have throw rugs and other things on the floor that can make you trip. What can I do with my stairs?  Do not leave any items on the stairs.  Make sure that there are handrails on  both sides of the stairs and use them. Fix handrails that are broken or loose. Make sure that handrails are as long as the stairways.  Check any carpeting to make sure that it is firmly attached to the stairs. Fix any carpet that is loose or worn.  Avoid having throw rugs at the top or bottom of the stairs. If you do have throw rugs, attach them to the floor with carpet tape.  Make sure that you have a light switch at the top of the stairs and the bottom of the stairs. If you do not have them, ask someone to add them for you. What else can I do to help prevent falls?  Wear shoes that:  Do not have high heels.  Have rubber bottoms.  Are comfortable and fit you well.  Are closed at the toe. Do not wear sandals.  If you use a stepladder:  Make sure that it is fully opened. Do not climb a closed stepladder.  Make sure that both sides of the stepladder are locked into place.  Ask someone to hold it for you, if possible.  Clearly mark and make sure that you can see:  Any grab bars or handrails.  First and last steps.  Where the edge of each step is.  Use tools that help you move around (mobility aids) if they are needed. These include:  Canes.  Walkers.  Scooters.  Crutches.  Turn on the lights when you go into a dark area. Replace any light bulbs as soon as they burn out.  Set up your furniture so you have a clear path. Avoid moving your furniture around.  If any of your floors are uneven, fix them.  If there are any pets around you, be aware of where they are.  Review your medicines with your doctor. Some medicines can make you feel dizzy. This can increase your chance of falling. Ask your doctor what other things that you can do to help prevent falls. This information is not intended to replace advice given to you by your health care provider. Make sure you discuss any questions you have with your health care provider. Document Released: 02/25/2009 Document  Revised: 10/07/2015 Document Reviewed: 06/05/2014 Elsevier Interactive Patient Education  2017 [employer].

## 2019-05-19 NOTE — Progress Notes (Signed)
This visit is being conducted via phone call due to the COVID-19 pandemic. This patient has given me verbal consent via phone to conduct this visit, patient states they are participating from their home address. Some vital signs may be absent or patient reported.   Patient identification: identified by name, DOB, and current address.  Location provider: [facility], Office Persons participating in the virtual visit: Mr. [name] and Kevin Forts, LPN.    Subjective:   [name] is a 74 y.o. male who presents for Medicare Annual/Subsequent preventive examination.  Mr. [name] reported recently testing positive for covid-19 on 05/16/2019. He is doing self quarantine at home. He reports he is afebrile and has only a mild, dry cough. He is scheduled for IV infusion on 05/21/2019 at [facility] for treatment. Discussed with him signs and symptoms to watch for at home.  He gets minimal exercise; walks approximately 5 minutes daily. He is compliant with wearing his CPAP at night.   Review of Systems:   Cardiac Risk Factors include: advanced age (>57men, >65 women);dyslipidemia;obesity (BMI >30kg/m2);male gender;hypertension;sedentary lifestyle     Objective:    Vitals: Temp 99.5 F (37.5 C) (Temporal) Comment: patient reported  Ht 5\' 9"  (1.753 m)   Wt 271 lb (122.9 kg)   BMI 40.02 kg/m   Body mass index is 40.02 kg/m.  Advanced Directives 05/19/2019 04/08/2015 10/28/2013 05/31/2011 05/30/2011 05/25/2011  Does Patient Have a Medical Advance Directive? Yes No Patient does not have advance directive Patient does not have advance directive - Patient does not have advance directive  Type of Advance Directive Living will;Healthcare Power of Attorney - - - - -  Does patient want to make changes to medical advance directive? No - Patient declined - - - - -  Copy of [place] in Chart? No - copy requested - - - - -  Would patient like information on creating a medical  advance directive? - No - patient declined information - - - -  Pre-existing out of facility DNR order (yellow form or pink MOST form) - - - No No -    Tobacco Social History   Tobacco Use  Smoking Status Former Smoker  . Quit date: 03/01/1967  . Years since quitting: 52.2  Smokeless Tobacco Never Used  Tobacco Comment   Pt states he was a social smoker     Counseling given: Not Answered Comment: Pt states he was a social smoker   Clinical Intake:  Pre-visit preparation completed: Yes  Pain : No/denies pain     BMI - recorded: 40.02 Nutritional Status: BMI > 30  Obese Nutritional Risks: None Diabetes: No  How often do you need to have someone help you when you read instructions, pamphlets, or other written materials from your doctor or pharmacy?: 1 - Never What is the last grade level you completed in school?: high school graduate  Interpreter Needed?: No  Information entered by :: Kevin Forts, LPN.  Past Medical History:  Diagnosis Date  . Arthritis   . Cataract    right eye removed   . Complication of anesthesia    "hard to wake up post op" per pt and wife   . GERD (gastroesophageal reflux disease)   . Hyperlipidemia   . Hypertension   . Neuromuscular disorder ([place])    pinched nerve in foot  . Sleep apnea    wears cpap    Past Surgical History:  Procedure Laterality Date  . CATARACT EXTRACTION  Right   . COLONOSCOPY    . JOINT REPLACEMENT     bilateral knee replacenent  . TOTAL KNEE REVISION  05/30/2011   Procedure: TOTAL KNEE REVISION;  Surgeon: Meredith Pel, MD;  Location: [facility];  Service: Orthopedics;  Laterality: Right;  Right total knee arthroplasty polyethylene spacer exchange   Family History  Problem Relation Age of Onset  . Diabetes Sister   . Stroke Sister   . Colon cancer Neg Hx   . Colon polyps Neg Hx   . Esophageal cancer Neg Hx   . Rectal cancer Neg Hx   . Stomach cancer Neg Hx    Social History   Socioeconomic  History  . Marital status: Married    Spouse name: Not on file  . Number of children: 2  . Years of education: 68  . Highest education level: High school graduate  Occupational History  . Occupation: retired  Tobacco Use  . Smoking status: Former Smoker    Quit date: 03/01/1967    Years since quitting: 52.2  . Smokeless tobacco: Never Used  . Tobacco comment: Pt states he was a social smoker  Substance and Sexual Activity  . Alcohol use: Yes    Comment: holidays  . Drug use: No  . Sexual activity: Not on file  Other Topics Concern  . Not on file  Social History Narrative   Retired for an Research scientist (physical sciences) for a [employer]   Two children who live in [place]   Married; [name] 2    Social Determinants of Health   Financial Resource Strain: [place]   . Difficulty of Paying Living Expenses: Not very hard  Food Insecurity:   . Worried About Charity fundraiser in the Last Year: Not on file  . Ran Out of Food in the Last Year: Not on file  Transportation Needs: No Transportation Needs  . Lack of Transportation (Medical): No  . Lack of Transportation (Non-Medical): No  Physical Activity: Insufficiently Active  . Days of Exercise per Week: 7 days  . Minutes of Exercise per Session: 10 min  Stress:   . Feeling of Stress : Not on file  Social Connections: Unknown  . Frequency of Communication with Friends and Family: More than three times a week  . Frequency of Social Gatherings with Friends and Family: Once a week  . Attends Religious Services: Not on file  . Active Member of Clubs or Organizations: Not on file  . Attends Archivist Meetings: Not on file  . Marital Status: Married    Outpatient Encounter Medications as of 05/19/2019  Medication Sig  . amLODipine (NORVASC) 10 MG tablet TAKE 1 TABLET BY MOUTH EVERY DAY  . aspirin 81 MG tablet Take 81 mg by mouth daily.  . hydrochlorothiazide (HYDRODIURIL) 25 MG tablet TAKE 1 TABLET BY MOUTH EVERY DAY  .  losartan (COZAAR) 25 MG tablet TAKE 2 TABLETS (50MG ) DAILY  . omeprazole (PRILOSEC) 40 MG capsule TAKE 1 CAPSULE BY MOUTH EVERY DAY  . simvastatin (ZOCOR) 5 MG tablet TAKE 1 TABLET BY MOUTH EVERYDAY AT BEDTIME  . benzonatate (TESSALON PERLES) 100 MG capsule Take 1 capsule (100 mg total) by mouth 3 (three) times daily as needed. (Patient not taking: Reported on 05/19/2019)   No facility-administered encounter medications on file as of 05/19/2019.    Activities of Daily Living In your present state of health, do you have any difficulty performing the following activities: 05/19/2019  Hearing? N  Vision? N  Difficulty concentrating or making decisions? N  Walking or climbing stairs? N  Dressing or bathing? N  Doing errands, shopping? N  Preparing Food and eating ? N  Using the Toilet? N  In the past six months, have you accidently leaked urine? N  Do you have problems with loss of bowel control? N  Managing your Medications? N  Managing your Finances? N  Housekeeping or managing your Housekeeping? N  Some recent data might be hidden    Patient Care Team: [name], NP as PCP - General (Family Medicine)   Assessment:   This is a routine wellness examination for [place].  Exercise Activities and Dietary recommendations Current Exercise Habits: The patient does not participate in regular exercise at present, Exercise limited by: None identified  Goals   None     Fall Risk Fall Risk  05/19/2019 01/23/2018 01/07/2015 10/28/2013 08/07/2013  Falls in the past year? 0 No No No No  Risk for fall due to : Medication side effect - - - -  Follow up Falls evaluation completed;Education provided;Falls prevention discussed - - - -   Is the patient's home free of loose throw rugs in walkways, pet beds, electrical cords, etc?   yes      Grab bars in the bathroom? yes      Handrails on the stairs?   N/A      Adequate lighting?   yes  Timed Get Up and Go Performed: N/A due to telephone  visit.  Depression Screen PHQ 2/9 Scores 05/19/2019 01/23/2018 02/25/2015 01/07/2015  PHQ - 2 Score 0 0 0 0    Cognitive Function     6CIT Screen 05/19/2019  What Year? 0 points  What month? 0 points  What time? 0 points  Count back from 20 0 points  Months in reverse 0 points  Repeat phrase 0 points  Total Score 0    Immunization History  Administered Date(s) Administered  . Fluad Quad(high Dose 65+) 02/19/2019  . Influenza Whole 05/15/2005, 02/12/2009, 01/18/2010  . Influenza, High Dose Seasonal PF 05/18/2016, 02/28/2017, 01/23/2018  . Influenza,inj,Quad PF,6+ Mos 02/26/2014, 01/07/2015  . Pneumococcal Conjugate-13 10/28/2013  . Pneumococcal Polysaccharide-23 07/26/2010  . Td 05/15/2002  . Tdap 10/24/2012    Qualifies for Shingles Vaccine? Yes, patient will inquire with pharmacy about his out of pocket costs.  Screening Tests Health Maintenance  Topic Date Due  . COLONOSCOPY  03/27/2021  . TETANUS/TDAP  10/25/2022  . INFLUENZA VACCINE  Completed  . Hepatitis C Screening  Completed  . PNA vac Low Risk Adult  Completed  . FOOT EXAM  Discontinued  . HEMOGLOBIN A1C  Discontinued  . OPHTHALMOLOGY EXAM  Discontinued   Cancer Screenings: Lung: Low Dose CT Chest recommended if Age 58-80 years, 30 pack-year currently smoking OR have quit w/in 15years. Patient does not qualify. Colorectal: yes, up to date currently.  Additional Screenings:  Hepatitis C Screening: completed 01/12/2019      Plan:   Mr. Sanangelo recently tested positive for Covid-19 on 05/16/2019. He is currently experiencing only a mild, dry cough and has been afebrile so far. He is scheduled for IV infusion treatment on 05/21/2019 at [facility]. Once he recovers, he will inquire about shingrix vaccine from his local pharmacy.    I have personally reviewed and noted the following in the patient's chart:   . Medical and social history . Use of alcohol, tobacco or illicit drugs  . Current medications and  supplements .  Functional ability and status . Nutritional status . Physical activity . Advanced directives . List of other physicians . Hospitalizations, surgeries, and ER visits in previous 12 months . Vitals . Screenings to include cognitive, depression, and falls . Referrals and appointments  In addition, I have reviewed and discussed with patient certain preventive protocols, quality metrics, and best practice recommendations. A written personalized care plan for preventive services as well as general preventive health recommendations were provided to patient.     Kevin Forts, LPN  [id number]

## 2019-05-19 NOTE — Progress Notes (Signed)
  I connected by phone with [name] on 05/19/2019 at 9:10 AM to discuss the potential use of an new treatment for mild to moderate COVID-19 viral infection in non-hospitalized patients.  This patient is a 74 y.o. male that meets the FDA criteria for Emergency Use Authorization of bamlanivimab or casirivimab\imdevimab.  Has a (+) direct SARS-CoV-2 viral test result  Has mild or moderate COVID-19   Is ? 75 years of age and weighs ? 40 kg  Is NOT hospitalized due to COVID-19  Is NOT requiring oxygen therapy or requiring an increase in baseline oxygen flow rate due to COVID-19  Is within 10 days of symptom onset  Has at least one of the high risk factor(s) for progression to severe COVID-19 and/or hospitalization as defined in EUA.  Specific high risk criteria : Hypertension  Patient Active Problem List   Diagnosis Date Noted  . OSA (obstructive sleep apnea) 04/16/2017  . 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 IMPAIRED GLUCOSE TOLERANCE 01/18/2010  . [place] DISEASE, CERVICAL 10/22/2008  . EXERCISE INDUCED BRONCHOSPASM 07/15/2007  . 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 DYSHIDROSIS 05/17/2007  . DEGENERATIVE JOINT DISEASE, BOTH KNEES, SEVERE 05/17/2007  . Essential hypertension 12/21/2006  . Disorder resulting from impaired renal function 12/21/2006        I have spoken and communicated the following to the patient or parent/caregiver:  1. FDA has authorized the emergency use of bamlanivimab and casirivimab\imdevimab for the treatment of mild to moderate COVID-19 in adults and pediatric patients with positive results of direct SARS-CoV-2 viral testing who are 74 years of age and older weighing at least 40 kg, and who are at high risk for progressing to severe COVID-19 and/or hospitalization.  2. The significant known and potential risks and benefits of bamlanivimab and casirivimab\imdevimab, and the extent to which such potential risks and  benefits are unknown.  3. Information on available alternative treatments and the risks and benefits of those alternatives, including clinical trials.  4. Patients treated with bamlanivimab and casirivimab\imdevimab should continue to self-isolate and use infection control measures (e.g., wear mask, isolate, social distance, avoid sharing personal items, clean and disinfect "high touch" surfaces, and frequent handwashing) according to CDC guidelines.   5. The patient or parent/caregiver has the option to accept or refuse bamlanivimab or casirivimab\imdevimab .  After reviewing this information with the patient, The patient agreed to proceed with receiving the bamlanimivab infusion and will be provided a copy of the Fact sheet prior to receiving the infusion.Fenton Foy 05/19/2019 9:10 AM

## 2019-05-20 ENCOUNTER — Other Ambulatory Visit: Payer: Self-pay

## 2019-05-20 ENCOUNTER — Encounter: Payer: Medicare HMO | Admitting: Adult Health

## 2019-05-20 ENCOUNTER — Encounter: Payer: Self-pay | Admitting: Adult Health

## 2019-05-20 ENCOUNTER — Telehealth (INDEPENDENT_AMBULATORY_CARE_PROVIDER_SITE_OTHER): Payer: Medicare HMO | Admitting: Adult Health

## 2019-05-20 DIAGNOSIS — R05 Cough: Secondary | ICD-10-CM | POA: Diagnosis not present

## 2019-05-20 DIAGNOSIS — R058 Other specified cough: Secondary | ICD-10-CM

## 2019-05-20 DIAGNOSIS — Z20822 Contact with and (suspected) exposure to covid-19: Secondary | ICD-10-CM

## 2019-05-20 MED ORDER — HYDROCODONE-HOMATROPINE 5-1.5 MG/5ML PO SYRP: 5.0000 mL | ORAL_SOLUTION | Freq: Three times a day (TID) | ORAL | 0 refills | Status: DC | PRN

## 2019-05-20 NOTE — Progress Notes (Signed)
Virtual Visit via Telephone Note  I connected with [name] on 05/20/19 at 10:30 AM EST by telephone and verified that I am speaking with the correct person using two identifiers.   I discussed the limitations, risks, security and privacy concerns of performing an evaluation and management service by telephone and the availability of in person appointments. I also discussed with the patient that there may be a patient responsible charge related to this service. The patient expressed understanding and agreed to proceed.  Location patient: home Location provider: work or home office Participants present for the call: patient, provider Patient did not have a visit in the prior 7 days to address this/these issue(s).   History of Present Illness: 74 year old male who is being evaluated today for an acute issue.  He tested positive for COVID-19 4 days ago.  He reports that his entire family tested positive.  He denies fevers, chills, body aches, shortness of breath, or chest pain.  His biggest complaint is that of a chronic dry cough and hoarse voice.  He is scheduled tomorrow for bamlanivimab infusion.      Observations/Objective: Patient sounds cheerful and well on the phone. I do not appreciate any SOB. Speech and thought processing are grossly intact. Patient reported vitals:  Assessment and Plan: 1. Cough with exposure to COVID-19 virus -We will prescribe a short course of Hycodan cough syrup.  He was advised to use this sparingly and that it will make him drowsy.  He was advised that coughing however knowing that it is is actually a good thing is this is helping keep his lungs open and cutting down on the risk for pneumonia. - HYDROcodone-homatropine (HYCODAN) 5-1.5 MG/5ML syrup; Take 5 mLs by mouth every 8 (eight) hours as needed for cough.  Dispense: 120 mL; Refill: 0   Follow Up Instructions:   I did not refer this patient for an OV in the next 24 hours for this/these  issue(s).  I discussed the assessment and treatment plan with the patient. The patient was provided an opportunity to ask questions and all were answered. The patient agreed with the plan and demonstrated an understanding of the instructions.   The patient was advised to call back or seek an in-person evaluation if the symptoms worsen or if the condition fails to improve as anticipated.  I provided 15 minutes of non-face-to-face time during this encounter.   [name], NP

## 2019-05-21 ENCOUNTER — Ambulatory Visit (HOSPITAL_COMMUNITY)
Admission: RE | Admit: 2019-05-21 | Discharge: 2019-05-21 | Disposition: A | Discharge status: Home / Self Care | Payer: Medicare Other | Source: Ambulatory Visit | Attending: Pulmonary Disease | Admitting: Pulmonary Disease

## 2019-05-21 ENCOUNTER — Encounter (HOSPITAL_COMMUNITY): Payer: Self-pay

## 2019-05-21 DIAGNOSIS — I10 Essential (primary) hypertension: Secondary | ICD-10-CM

## 2019-05-21 DIAGNOSIS — Z23 Encounter for immunization: Secondary | ICD-10-CM | POA: Diagnosis not present

## 2019-05-21 DIAGNOSIS — U071 COVID-19: Secondary | ICD-10-CM

## 2019-05-21 MED ORDER — DIPHENHYDRAMINE HCL 50 MG/ML IJ SOLN: 50.0000 mg | Freq: Once | INTRAMUSCULAR | Status: DC | PRN

## 2019-05-21 MED ORDER — EPINEPHRINE 0.3 MG/0.3ML IJ SOAJ: 0.3000 mg | Freq: Once | INTRAMUSCULAR | Status: DC | PRN

## 2019-05-21 MED ORDER — ALBUTEROL SULFATE HFA 108 (90 BASE) MCG/ACT IN AERS: 2.0000 | INHALATION_SPRAY | Freq: Once | RESPIRATORY_TRACT | Status: DC | PRN

## 2019-05-21 MED ORDER — SODIUM CHLORIDE 0.9 % IV SOLN
700.0000 mg | Freq: Once | INTRAVENOUS | Status: AC
  Administered 2019-05-21: 700 mg via INTRAVENOUS
  Filled 2019-05-21: qty 20

## 2019-05-21 MED ORDER — FAMOTIDINE IN NACL 20-0.9 MG/50ML-% IV SOLN: 20.0000 mg | Freq: Once | INTRAVENOUS | Status: DC | PRN

## 2019-05-21 MED ORDER — SODIUM CHLORIDE 0.9 % IV SOLN
INTRAVENOUS | Status: DC | PRN
  Administered 2019-05-21: 15:00:00 250 mL via INTRAVENOUS

## 2019-05-21 MED ORDER — METHYLPREDNISOLONE SODIUM SUCC 125 MG IJ SOLR: 125.0000 mg | Freq: Once | INTRAMUSCULAR | Status: DC | PRN

## 2019-05-21 NOTE — Progress Notes (Signed)
  Diagnosis: COVID-19  Physician: Dr. Carlisle Cater  Procedure: [facility] Med: bamlanivimab infusion - Provided patient with bamlanimivab fact sheet for patients, parents and caregivers prior to infusion.  Complications: No immediate complications noted.  Discharge: Discharged home   Leatrice Parilla N Vitaliy Eisenhour 05/21/2019

## 2019-05-21 NOTE — Discharge Instructions (Signed)

## 2019-06-03 DIAGNOSIS — G4733 Obstructive sleep apnea (adult) (pediatric): Secondary | ICD-10-CM | POA: Diagnosis not present

## 2019-06-08 ENCOUNTER — Ambulatory Visit: Payer: Medicare Other

## 2019-06-25 ENCOUNTER — Other Ambulatory Visit: Payer: Self-pay | Admitting: Adult Health

## 2019-06-25 DIAGNOSIS — I10 Essential (primary) hypertension: Secondary | ICD-10-CM

## 2019-06-25 DIAGNOSIS — Z760 Encounter for issue of repeat prescription: Secondary | ICD-10-CM

## 2019-07-01 DIAGNOSIS — N529 Male erectile dysfunction, unspecified: Secondary | ICD-10-CM | POA: Diagnosis not present

## 2019-07-01 DIAGNOSIS — I739 Peripheral vascular disease, unspecified: Secondary | ICD-10-CM | POA: Diagnosis not present

## 2019-07-01 DIAGNOSIS — M1990 Unspecified osteoarthritis, unspecified site: Secondary | ICD-10-CM | POA: Diagnosis not present

## 2019-07-01 DIAGNOSIS — G4730 Sleep apnea, unspecified: Secondary | ICD-10-CM | POA: Diagnosis not present

## 2019-07-01 DIAGNOSIS — Z008 Encounter for other general examination: Secondary | ICD-10-CM | POA: Diagnosis not present

## 2019-07-01 DIAGNOSIS — K219 Gastro-esophageal reflux disease without esophagitis: Secondary | ICD-10-CM | POA: Diagnosis not present

## 2019-07-01 DIAGNOSIS — Z7982 Long term (current) use of aspirin: Secondary | ICD-10-CM | POA: Diagnosis not present

## 2019-07-01 DIAGNOSIS — I10 Essential (primary) hypertension: Secondary | ICD-10-CM | POA: Diagnosis not present

## 2019-07-01 DIAGNOSIS — Z6841 Body Mass Index (BMI) 40.0 and over, adult: Secondary | ICD-10-CM | POA: Diagnosis not present

## 2019-07-01 DIAGNOSIS — E785 Hyperlipidemia, unspecified: Secondary | ICD-10-CM | POA: Diagnosis not present

## 2019-07-04 DIAGNOSIS — G4733 Obstructive sleep apnea (adult) (pediatric): Secondary | ICD-10-CM | POA: Diagnosis not present

## 2019-07-18 ENCOUNTER — Other Ambulatory Visit: Payer: Self-pay | Admitting: Adult Health

## 2019-07-18 DIAGNOSIS — Z760 Encounter for issue of repeat prescription: Secondary | ICD-10-CM

## 2019-07-18 DIAGNOSIS — I10 Essential (primary) hypertension: Secondary | ICD-10-CM

## 2019-07-22 ENCOUNTER — Other Ambulatory Visit: Payer: Self-pay | Admitting: Adult Health

## 2019-07-22 DIAGNOSIS — I10 Essential (primary) hypertension: Secondary | ICD-10-CM

## 2019-07-22 DIAGNOSIS — Z760 Encounter for issue of repeat prescription: Secondary | ICD-10-CM

## 2019-07-22 NOTE — Telephone Encounter (Signed)
Sent to the pharmacy by e-scribe.  Pt has upcoming cpx on 09/18/2019.

## 2019-07-23 NOTE — Telephone Encounter (Signed)
AMLODIPINE DENIED.  FILLED ON 06/25/2019 FOR 90 DAYS.  SIMVASTATIN SENT IN FOR 90 DAYS.  PT HAS UPCOMING CPX ON 09/18/2019.

## 2019-08-01 DIAGNOSIS — G4733 Obstructive sleep apnea (adult) (pediatric): Secondary | ICD-10-CM | POA: Diagnosis not present

## 2019-09-03 DIAGNOSIS — G4733 Obstructive sleep apnea (adult) (pediatric): Secondary | ICD-10-CM | POA: Diagnosis not present

## 2019-09-17 ENCOUNTER — Other Ambulatory Visit: Payer: Self-pay

## 2019-09-18 ENCOUNTER — Encounter: Payer: Self-pay | Admitting: Adult Health

## 2019-09-18 ENCOUNTER — Ambulatory Visit (INDEPENDENT_AMBULATORY_CARE_PROVIDER_SITE_OTHER): Payer: Medicare HMO | Admitting: Adult Health

## 2019-09-18 VITALS — BP 120/74 | Temp 98.2°F | Ht 69.0 in | Wt 274.0 lb

## 2019-09-18 DIAGNOSIS — Z0000 Encounter for general adult medical examination without abnormal findings: Secondary | ICD-10-CM | POA: Diagnosis not present

## 2019-09-18 DIAGNOSIS — E739 Lactose intolerance, unspecified: Secondary | ICD-10-CM | POA: Diagnosis not present

## 2019-09-18 DIAGNOSIS — E785 Hyperlipidemia, unspecified: Secondary | ICD-10-CM

## 2019-09-18 DIAGNOSIS — E668 Other obesity: Secondary | ICD-10-CM

## 2019-09-18 DIAGNOSIS — G4733 Obstructive sleep apnea (adult) (pediatric): Secondary | ICD-10-CM

## 2019-09-18 DIAGNOSIS — Z125 Encounter for screening for malignant neoplasm of prostate: Secondary | ICD-10-CM | POA: Diagnosis not present

## 2019-09-18 DIAGNOSIS — I10 Essential (primary) hypertension: Secondary | ICD-10-CM

## 2019-09-18 NOTE — Addendum Note (Signed)
Addended by: Elmer Picker on: 09/18/2019 10:52 AM   Modules accepted: Orders

## 2019-09-18 NOTE — Progress Notes (Signed)
Subjective:    Patient ID: [name], male    DOB: 1945-07-04, 73 y.o.   MRN: [id number]  HPI Patient presents for yearly preventative medicine examination. He is a pleasant 74 year old male who  has a past medical history of Arthritis, Cataract, Complication of anesthesia, GERD (gastroesophageal reflux disease), Hyperlipidemia, Hypertension, Neuromuscular disorder ([place]), and Sleep apnea.  Essential Hypertension -takes Norvasc 10 mg, hydrochlorothiazide 25 mg, and Cozaar 50 mg daily.  He denies dizziness, lightheadedness, chest pain, or shortness of breath BP Readings from Last 3 Encounters:  09/18/19 120/74  05/21/19 114/73  05/16/19 116/72   OSA -is a CPAP on a nightly basis.  He reports restful sleep when using, denies daytime somnolence, waking up feeling fatigued, or the need for an afternoon nap  Hyperlipidemia - takes Zocor 5 mg daily  Lab Results  Component Value Date   CHOL 172 01/23/2018   HDL 41.20 01/23/2018   LDLCALC 120 (H) 01/23/2018   LDLDIRECT 167.6 07/08/2009   TRIG 56.0 01/23/2018   CHOLHDL 4 01/23/2018    All immunizations and health maintenance protocols were reviewed with the patient and needed orders were placed.  Appropriate screening laboratory values were ordered for the patient including screening of hyperlipidemia, renal function and hepatic function. If indicated by BPH, a PSA was ordered.  Medication reconciliation,  past medical history, social history, problem list and allergies were reviewed in detail with the patient  Goals were established with regard to weight loss, exercise, and  diet in compliance with medications  Wt Readings from Last 3 Encounters:  09/18/19 274 lb (124.3 kg)  05/19/19 271 lb (122.9 kg)  03/27/18 270 lb (122.5 kg)    He is up-to-date on routine colon cancer screening, his next colonoscopy is in November 2022, he is on 3-year plan due to polyps.   Review of Systems  Constitutional: Negative.   HENT:  Negative.   Eyes: Negative.   Respiratory: Negative.   Cardiovascular: Negative.   Gastrointestinal: Negative.   Endocrine: Negative.   Genitourinary: Negative.   Musculoskeletal: Positive for arthralgias.  Skin: Negative.   Allergic/Immunologic: Negative.   Neurological: Negative.   Hematological: Negative.   Psychiatric/Behavioral: Negative.   All other systems reviewed and are negative.  Past Medical History:  Diagnosis Date  . Arthritis   . Cataract    right eye removed   . Complication of anesthesia    "hard to wake up post op" per pt and wife   . GERD (gastroesophageal reflux disease)   . Hyperlipidemia   . Hypertension   . Neuromuscular disorder ([place])    pinched nerve in foot  . Sleep apnea    wears cpap     Social History   Socioeconomic History  . Marital status: Married    Spouse name: Not on file  . Number of children: 2  . Years of education: 70  . Highest education level: High school graduate  Occupational History  . Occupation: retired  Tobacco Use  . Smoking status: Former Smoker    Quit date: 03/01/1967    Years since quitting: 52.5  . Smokeless tobacco: Never Used  . Tobacco comment: Pt states he was a social smoker  Substance and Sexual Activity  . Alcohol use: Yes    Comment: holidays  . Drug use: No  . Sexual activity: Not on file  Other Topics Concern  . Not on file  Social History Narrative   Retired for an Research scientist (physical sciences)  for a [employer]   Two children who live in [place]   Married; [facility] 2    Social Determinants of Health   Financial Resource Strain: Low Risk   . Difficulty of Paying Living Expenses: Not very hard  Food Insecurity:   . Worried About Charity fundraiser in the Last Year:   . Arboriculturist in the Last Year:   Transportation Needs: No Transportation Needs  . Lack of Transportation (Medical): No  . Lack of Transportation (Non-Medical): No  Physical Activity: Insufficiently Active  . Days of  Exercise per Week: 7 days  . Minutes of Exercise per Session: 10 min  Stress:   . Feeling of Stress :   Social Connections: Unknown  . Frequency of Communication with Friends and Family: More than three times a week  . Frequency of Social Gatherings with Friends and Family: Once a week  . Attends Religious Services: Not on file  . Active Member of Clubs or Organizations: Not on file  . Attends Archivist Meetings: Not on file  . Marital Status: Married  Human resources officer Violence:   . Fear of Current or Ex-Partner:   . Emotionally Abused:   Marland Kitchen Physically Abused:   . Sexually Abused:     Past Surgical History:  Procedure Laterality Date  . CATARACT EXTRACTION Right   . COLONOSCOPY    . JOINT REPLACEMENT     bilateral knee replacenent  . TOTAL KNEE REVISION  05/30/2011   Procedure: TOTAL KNEE REVISION;  Surgeon: Meredith Pel, MD;  Location: [facility];  Service: Orthopedics;  Laterality: Right;  Right total knee arthroplasty polyethylene spacer exchange    Family History  Problem Relation Age of Onset  . Diabetes Sister   . Stroke Sister   . Colon cancer Neg Hx   . Colon polyps Neg Hx   . Esophageal cancer Neg Hx   . Rectal cancer Neg Hx   . Stomach cancer Neg Hx     Allergies  Allergen Reactions  . Lisinopril Cough    Current Outpatient Medications on File Prior to Visit  Medication Sig Dispense Refill  . amLODipine (NORVASC) 10 MG tablet TAKE 1 TABLET BY MOUTH EVERY DAY 90 tablet 0  . aspirin 81 MG tablet Take 81 mg by mouth daily.    . hydrochlorothiazide (HYDRODIURIL) 25 MG tablet TAKE 1 TABLET BY MOUTH EVERY DAY 90 tablet 0  . losartan (COZAAR) 25 MG tablet TAKE 2 TABLETS BY MOUTH DAILY 180 tablet 0  . omeprazole (PRILOSEC) 40 MG capsule TAKE 1 CAPSULE BY MOUTH EVERY DAY 90 capsule 0   No current facility-administered medications on file prior to visit.    BP 120/74   Temp 98.2 F (36.8 C)   Ht 5\' 9"  (1.753 m)   Wt 274 lb (124.3 kg)   BMI 40.46  kg/m       Objective:   Physical Exam Vitals and nursing note reviewed.  Constitutional:      General: He is not in acute distress.    Appearance: Normal appearance. He is well-developed. He is obese.  HENT:     Head: Normocephalic and atraumatic.     Right Ear: Tympanic membrane, ear canal and external ear normal. There is no impacted cerumen.     Left Ear: Tympanic membrane, ear canal and external ear normal. There is no impacted cerumen.     Nose: Nose normal. No congestion or rhinorrhea.     Mouth/Throat:  Mouth: Mucous membranes are moist.     Pharynx: Oropharynx is clear. No oropharyngeal exudate or posterior oropharyngeal erythema.  Eyes:     General:        Right eye: No discharge.        Left eye: No discharge.     Extraocular Movements: Extraocular movements intact.     Conjunctiva/sclera: Conjunctivae normal.     Pupils: Pupils are equal, round, and reactive to light.  Neck:     Vascular: No carotid bruit.     Trachea: No tracheal deviation.  Cardiovascular:     Rate and Rhythm: Normal rate and regular rhythm.     Pulses: Normal pulses.     Heart sounds: Normal heart sounds. No murmur. No friction rub. No gallop.   Pulmonary:     Effort: Pulmonary effort is normal. No respiratory distress.     Breath sounds: Normal breath sounds. No stridor. No wheezing, rhonchi or rales.  Chest:     Chest wall: No tenderness.  Abdominal:     General: Bowel sounds are normal. There is no distension.     Palpations: Abdomen is soft. There is no mass.     Tenderness: There is no abdominal tenderness. There is no right CVA tenderness, left CVA tenderness, guarding or rebound.     Hernia: No hernia is present.  Musculoskeletal:        General: No swelling, tenderness, deformity or signs of injury. Normal range of motion.     Right lower leg: No edema.     Left lower leg: No edema.  Lymphadenopathy:     Cervical: No cervical adenopathy.  Skin:    General: Skin is warm and  dry.     Capillary Refill: Capillary refill takes less than 2 seconds.     Coloration: Skin is not jaundiced or pale.     Findings: No bruising, erythema, lesion or rash.  Neurological:     General: No focal deficit present.     Mental Status: He is alert and oriented to person, place, and time.     Cranial Nerves: No cranial nerve deficit.     Sensory: No sensory deficit.     Motor: No weakness.     Coordination: Coordination normal.     Gait: Gait normal.     Deep Tendon Reflexes: Reflexes normal.  Psychiatric:        Mood and Affect: Mood normal.        Behavior: Behavior normal.        Thought Content: Thought content normal.        Judgment: Judgment normal.       Assessment & Plan:  1. Routine general medical examination at a health care facility - Needs to lose weight through lifestyle modifications  - Follow up in one year or sooner if needed - CBC with Differential/Platelet - Comprehensive metabolic panel - Hemoglobin A1c - Lipid panel - TSH  2. OSA (obstructive sleep apnea) - Follow up with pulmonary as directed - continue with CPAP - CBC with Differential/Platelet - Comprehensive metabolic panel - Hemoglobin A1c - Lipid panel - TSH  3. IMPAIRED GLUCOSE TOLERANCE - Consider Metformin  - CBC with Differential/Platelet - Comprehensive metabolic panel - Hemoglobin A1c - Lipid panel - TSH  4. Essential hypertension - BP well controlled.  - No change in medication  - CBC with Differential/Platelet - Comprehensive metabolic panel - Hemoglobin A1c - Lipid panel - TSH  5. Prostate cancer screening  -  PSA  6. Hyperlipidemia, unspecified hyperlipidemia type - Consider increase in statin  - CBC with Differential/Platelet - Comprehensive metabolic panel - Hemoglobin A1c - Lipid panel - TSH  7. Other obesity - Needs to work on weight loss through diet and exercise  - CBC with Differential/Platelet - Comprehensive metabolic panel - Hemoglobin  A1c - Lipid panel - TSH  [name], NP

## 2019-09-18 NOTE — Patient Instructions (Signed)
It was great seeing you today   We will follow up with you regarding your blood work  Please work on weight loss through diet and exercise   You can use Motrin 600 mg ( three tabs) every 6-8 hours for the next 5 days

## 2019-10-01 DIAGNOSIS — R69 Illness, unspecified: Secondary | ICD-10-CM | POA: Diagnosis not present

## 2019-10-03 DIAGNOSIS — G4733 Obstructive sleep apnea (adult) (pediatric): Secondary | ICD-10-CM | POA: Diagnosis not present

## 2019-10-06 DIAGNOSIS — R69 Illness, unspecified: Secondary | ICD-10-CM | POA: Diagnosis not present

## 2019-10-14 ENCOUNTER — Other Ambulatory Visit: Payer: Self-pay | Admitting: Adult Health

## 2019-10-14 DIAGNOSIS — Z760 Encounter for issue of repeat prescription: Secondary | ICD-10-CM

## 2019-10-15 ENCOUNTER — Other Ambulatory Visit: Payer: Self-pay | Admitting: Adult Health

## 2019-10-15 DIAGNOSIS — I10 Essential (primary) hypertension: Secondary | ICD-10-CM

## 2019-10-15 DIAGNOSIS — Z760 Encounter for issue of repeat prescription: Secondary | ICD-10-CM

## 2019-10-16 NOTE — Telephone Encounter (Signed)
Needs to come in for blood work. If he cannot get in this week then needs to come in next week. If he needs meds to cover him until he comes in then ok to send in 15 tabs

## 2019-10-16 NOTE — Telephone Encounter (Signed)
Sent to the pharmacy by e-scribe. 

## 2019-10-16 NOTE — Telephone Encounter (Signed)
Pt scheduled for lab work on 10/17/19.  Will hold

## 2019-10-17 ENCOUNTER — Other Ambulatory Visit: Payer: Self-pay

## 2019-10-17 ENCOUNTER — Other Ambulatory Visit (INDEPENDENT_AMBULATORY_CARE_PROVIDER_SITE_OTHER): Payer: Medicare HMO

## 2019-10-17 DIAGNOSIS — I10 Essential (primary) hypertension: Secondary | ICD-10-CM

## 2019-10-17 DIAGNOSIS — E739 Lactose intolerance, unspecified: Secondary | ICD-10-CM

## 2019-10-17 DIAGNOSIS — G4733 Obstructive sleep apnea (adult) (pediatric): Secondary | ICD-10-CM | POA: Diagnosis not present

## 2019-10-17 DIAGNOSIS — Z125 Encounter for screening for malignant neoplasm of prostate: Secondary | ICD-10-CM

## 2019-10-17 DIAGNOSIS — E785 Hyperlipidemia, unspecified: Secondary | ICD-10-CM | POA: Diagnosis not present

## 2019-10-17 DIAGNOSIS — Z0000 Encounter for general adult medical examination without abnormal findings: Secondary | ICD-10-CM

## 2019-10-17 DIAGNOSIS — E668 Other obesity: Secondary | ICD-10-CM | POA: Diagnosis not present

## 2019-10-17 LAB — CBC WITH DIFFERENTIAL/PLATELET
Basophils Absolute: 0.1 10*3/uL (ref 0.0–0.1)
Basophils Relative: 1.1 % (ref 0.0–3.0)
Eosinophils Absolute: 0.2 10*3/uL (ref 0.0–0.7)
Eosinophils Relative: 2.9 % (ref 0.0–5.0)
HCT: 38.3 % — ABNORMAL LOW (ref 39.0–52.0)
Hemoglobin: 13.1 g/dL (ref 13.0–17.0)
Lymphocytes Relative: 46.7 % — ABNORMAL HIGH (ref 12.0–46.0)
Lymphs Abs: 2.5 10*3/uL (ref 0.7–4.0)
MCHC: 34.3 g/dL (ref 30.0–36.0)
MCV: 90.2 fl (ref 78.0–100.0)
Monocytes Absolute: 0.5 10*3/uL (ref 0.1–1.0)
Monocytes Relative: 9.3 % (ref 3.0–12.0)
Neutro Abs: 2.2 10*3/uL (ref 1.4–7.7)
Neutrophils Relative %: 40 % — ABNORMAL LOW (ref 43.0–77.0)
Platelets: 243 10*3/uL (ref 150.0–400.0)
RBC: 4.24 Mil/uL (ref 4.22–5.81)
RDW: 12.5 % (ref 11.5–15.5)
WBC: 5.4 10*3/uL (ref 4.0–10.5)

## 2019-10-17 LAB — COMPREHENSIVE METABOLIC PANEL
ALT: 12 U/L (ref 0–53)
AST: 14 U/L (ref 0–37)
Albumin: 4.2 g/dL (ref 3.5–5.2)
Alkaline Phosphatase: 96 U/L (ref 39–117)
BUN: 16 mg/dL (ref 6–23)
CO2: 29 mEq/L (ref 19–32)
Calcium: 9.8 mg/dL (ref 8.4–10.5)
Chloride: 102 mEq/L (ref 96–112)
Creatinine, Ser: 0.9 mg/dL (ref 0.40–1.50)
GFR: 99.65 mL/min (ref >60.00)
Glucose, Bld: 117 mg/dL — ABNORMAL HIGH (ref 70–99)
Potassium: 4 mEq/L (ref 3.5–5.1)
Sodium: 139 mEq/L (ref 135–145)
Total Bilirubin: 0.8 mg/dL (ref 0.2–1.2)
Total Protein: 7.4 g/dL (ref 6.0–8.3)

## 2019-10-17 LAB — LIPID PANEL
Cholesterol: 161 mg/dL (ref 0–200)
HDL: 47 mg/dL (ref >39.00)
LDL Cholesterol: 103 mg/dL — ABNORMAL HIGH (ref 0–99)
NonHDL: 113.59
Total CHOL/HDL Ratio: 3
Triglycerides: 52 mg/dL (ref 0.0–149.0)
VLDL: 10.4 mg/dL (ref 0.0–40.0)

## 2019-10-17 LAB — HEMOGLOBIN A1C: Hgb A1c MFr Bld: 5.6 % (ref 4.6–6.5)

## 2019-10-17 LAB — PSA: PSA: 3.26 ng/mL (ref 0.10–4.00)

## 2019-10-17 LAB — TSH: TSH: 1.33 u[IU]/mL (ref 0.35–4.50)

## 2019-10-17 MED ORDER — AMLODIPINE BESYLATE 10 MG PO TABS: 10.0000 mg | ORAL_TABLET | Freq: Every day | ORAL | 2 refills | Status: DC

## 2019-10-17 NOTE — Addendum Note (Signed)
Addended by: Miles Costain T on: 10/17/2019 01:35 PM   Modules accepted: Orders

## 2019-11-03 DIAGNOSIS — G4733 Obstructive sleep apnea (adult) (pediatric): Secondary | ICD-10-CM | POA: Diagnosis not present

## 2019-11-05 ENCOUNTER — Telehealth: Payer: Self-pay | Admitting: Adult Health

## 2019-11-05 NOTE — Progress Notes (Signed)
°  Chronic Care Management   Outreach Note  11/05/2019 Name: DAN SCEARCE MRN: [id number] DOB: 10-08-1945  Referred by: [name], NP Reason for referral : No chief complaint on file.   An unsuccessful telephone outreach was attempted today. The patient was referred to the pharmacist for assistance with care management and care coordination.   Follow Up Plan:   [place]

## 2019-11-21 ENCOUNTER — Telehealth: Payer: Self-pay | Admitting: Adult Health

## 2019-11-21 NOTE — Progress Notes (Signed)
  Chronic Care Management   Note  11/21/2019 Name: [name] MRN: [id number] DOB: [birth year]  [name] is a 74 y.o. year old male who is a primary care patient of [name], NP. I reached out to [name] by phone today in response to a referral sent by Mr. [name]'s PCP, [name], NP.   [name] was given information about Chronic Care Management services today including:  1. CCM service includes personalized support from designated clinical staff supervised by his physician, including individualized plan of care and coordination with other care providers 2. 24/7 contact phone numbers for assistance for urgent and routine care needs. 3. Service will only be billed when office clinical staff spend 20 minutes or more in a month to coordinate care. 4. Only one practitioner may furnish and bill the service in a calendar month. 5. The patient may stop CCM services at any time (effective at the end of the month) by phone call to the office staff.   Patient agreed to services and verbal consent obtained.   Follow up plan:   [place]

## 2020-01-07 DIAGNOSIS — G4733 Obstructive sleep apnea (adult) (pediatric): Secondary | ICD-10-CM | POA: Diagnosis not present

## 2020-01-09 ENCOUNTER — Other Ambulatory Visit: Payer: Self-pay | Admitting: Adult Health

## 2020-01-09 DIAGNOSIS — I10 Essential (primary) hypertension: Secondary | ICD-10-CM

## 2020-01-09 DIAGNOSIS — E785 Hyperlipidemia, unspecified: Secondary | ICD-10-CM

## 2020-01-10 ENCOUNTER — Other Ambulatory Visit: Payer: Self-pay | Admitting: Adult Health

## 2020-01-10 DIAGNOSIS — Z760 Encounter for issue of repeat prescription: Secondary | ICD-10-CM

## 2020-01-10 DIAGNOSIS — I10 Essential (primary) hypertension: Secondary | ICD-10-CM

## 2020-01-13 ENCOUNTER — Other Ambulatory Visit: Payer: Self-pay

## 2020-01-14 ENCOUNTER — Ambulatory Visit: Payer: Medicare HMO

## 2020-01-14 DIAGNOSIS — I10 Essential (primary) hypertension: Secondary | ICD-10-CM

## 2020-01-14 DIAGNOSIS — E785 Hyperlipidemia, unspecified: Secondary | ICD-10-CM

## 2020-01-14 NOTE — Chronic Care Management (AMB) (Signed)
Chronic Care Management Pharmacy  Name: [name]  MRN: [id number] DOB: [birth year]   Chief Complaint/ HPI  [name],  74 y.o. , male presents for their Initial CCM visit with the clinical pharmacist In office.  PCP : [name], NP Patient Care Team: [name], NP as PCP - General (Family Medicine) Germaine Pomfret, [facility] as Pharmacist (Pharmacist)  Their chronic conditions include: Hypertension, Hyperlipidemia, GERD and Osteoarthritis   Office Visits: 09/18/19: Patient presented to [name], NP for follow-up. Simvastatin increased to 10 mg daily. Hydrocodone-homatropine stopped.   Consult Visit: None noted in past 6 months.   Allergies  Allergen Reactions  . Lisinopril Cough    Medications: Outpatient Encounter Medications as of 01/14/2020  Medication Sig  . amLODipine (NORVASC) 10 MG tablet Take 1 tablet (10 mg total) by mouth daily.  Marland Kitchen aspirin 81 MG tablet Take 81 mg by mouth daily.  . hydrochlorothiazide (HYDRODIURIL) 25 MG tablet TAKE 1 TABLET BY MOUTH EVERY DAY  . losartan (COZAAR) 25 MG tablet TAKE 2 TABLETS BY MOUTH EVERY DAY  . omeprazole (PRILOSEC) 40 MG capsule TAKE 1 CAPSULE BY MOUTH EVERY DAY  . simvastatin (ZOCOR) 10 MG tablet Take 1 tablet (10 mg total) by mouth at bedtime.   No facility-administered encounter medications on file as of 01/14/2020.   Current Diagnosis/Assessment:  SDOH Interventions     Most Recent Value  SDOH Interventions  Financial Strain Interventions Intervention Not Indicated  Transportation Interventions Intervention Not Indicated      Goals Addressed            This Visit's Progress   . Chronic Care Management       CARE PLAN ENTRY (see longitudinal plan of care for additional care plan information)  Current Barriers:  . Chronic Disease Management support, education, and care coordination needs related to Hypertension, Hyperlipidemia, GERD and Osteoarthritis    Hypertension BP Readings from  Last 3 Encounters:  09/18/19 120/74  05/21/19 114/73  05/16/19 116/72   . Pharmacist Clinical Goal(s): o Over the next 90 days, patient will work with PharmD and providers to maintain BP goal <130/80 . Current regimen:  . Amlodipine 10 mg daily  . Hydrochlorothiazide 25 mg daily  . Losartan 25 mg 2 tablets daily . Interventions: o Discussed low salt diet and exercising as tolerated extensively . Patient self care activities - Over the next 90 days, patient will: o Check blood pressure 3-4 times weekly, document, and provide at future appointments o Ensure daily salt intake < 2300 mg/day  Hyperlipidemia Lab Results  Component Value Date/Time   LDLCALC 103 (H) 10/17/2019 08:55 AM   LDLDIRECT 167.6 07/08/2009 08:54 AM   . Pharmacist Clinical Goal(s): o Over the next 90 days, patient will work with PharmD and providers to achieve LDL goal < 100 . Current regimen:  o Simvastatin 10 mg daily . Interventions: o Discussed low cholesterol diet and exercising as tolerated extensively  Medication management . Pharmacist Clinical Goal(s): o Over the next 90 days, patient will work with PharmD and providers to maintain optimal medication adherence . [facility] . Interventions o Comprehensive medication review performed. o Continue current medication management strategy . Patient self care activities - Over the next 90 days, patient will: o Take medications as prescribed o Report any questions or concerns to PharmD and/or provider(s)      Hypertension   BP goal is:  <130/80  Office blood pressures are  BP Readings from Last  3 Encounters:  09/18/19 120/74  05/21/19 114/73  05/16/19 116/72   Patient checks BP at home 3-5x per week Patient home BP readings are ranging: 120-130s.  Patient has failed these meds in the past: Lisinopril (Cough) Patient is currently controlled on the following medications:  . Amlodipine 10 mg daily  . HCTZ 25 mg daily  . Losartan 25  mg 2 tablets daily   We discussed: Patient reports that he still has mild, dry cough cough that will "come and go." He states that it is much improved compared to when he was taking lisinopril, but still remains troublesome for him.   Patient watches his salt intake, and mainly seasons with pepper. He states they rarely eat at restaurants but when they do he will order fish.   He gets regular physical activity, stating he walks 3 days for at least 30 minutes. Previously he was going to the [facility], but has not been since the pandemic.   Plan  Recommend stopping losartan Recommend starting olmesartan 20 mg daily due to lower incidence of cough and longer half life.   Hyperlipidemia   LDL goal < 100  Lipid Panel     Component Value Date/Time   CHOL 161 10/17/2019 0855   TRIG 52.0 10/17/2019 0855   TRIG 59 05/24/2006 0906   HDL 47.00 10/17/2019 0855   LDLCALC 103 (H) 10/17/2019 0855   LDLDIRECT 167.6 07/08/2009 0854    Hepatic Function Latest Ref Rng & Units 10/17/2019 01/23/2018 01/11/2017  Total Protein 6.0 - 8.3 g/dL 7.4 7.4 7.1  Albumin 3.5 - 5.2 g/dL 4.2 4.1 4.0  AST 0 - 37 U/L 14 13 14   ALT 0 - 53 U/L 12 12 12   Alk Phosphatase 39 - 117 U/L 96 87 96  Total Bilirubin 0.2 - 1.2 mg/dL 0.8 0.7 0.9  Bilirubin, Direct 0.0 - 0.3 mg/dL - 0.1 0.2     The 10-year ASCVD risk score Mikey Bussing DC Jr., et al., 2013) is: 32.2%   Values used to calculate the score:     Age: 73 years     Sex: Male     Is Non-Hispanic African American: Yes     Diabetic: Yes     Tobacco smoker: No     Systolic Blood Pressure: 008 mmHg     Is BP treated: Yes     HDL Cholesterol: 47 mg/dL     Total Cholesterol: 161 mg/dL   Patient has failed these meds in past: n/a Patient is currently uncontrolled on the following medications:  . Aspirin 81 mg daily  . Simvastatin 10 mg QHS   We discussed: Patient is a high risk of ASCVD, but will defer recommendations for moderate or high intensity statin at this time due to  recent change to simvastatin 10 mg. Patient states he has been tolerating this change well with no myalgias.   Plan  Continue current medications  GERD   Patient denies dysphagia, heartburn or nausea. Expresses understanding to avoid triggers such as large meals and lying down after eating.  Currently controlled on: . Omeprazole 40 mg daily (started Feb 2018)   Plan   Continue current medication.   Osteoarthritis   Patient has failed these meds in past: n/a Patient is currently controlled on the following medications:  Marland Kitchen Unknown Topical Cream  We discussed:  Patient expresses significant arthritis in his hands, as well as his knees. His daughter purchased a topical cream that he rubs into his hand which he states  helps his pain somewhat. He will also wear gloves which will help with the inflammation.   Plan  Recommend Acetaminophen CR 650 mg twice daily for arthritis pain. Advised patient to avoid NSAID use if pain medication needed.   Vaccines   Reviewed and discussed patient's vaccination history.    Immunization History  Administered Date(s) Administered  . Fluad Quad(high Dose 65+) 02/19/2019  . Influenza Whole 05/15/2005, 02/12/2009, 01/18/2010  . Influenza, High Dose Seasonal PF 05/18/2016, 02/28/2017, 01/23/2018  . Influenza,inj,Quad PF,6+ Mos 02/26/2014, 01/07/2015  . Janssen (J&J) SARS-COV-2 Vaccination 08/21/2019  . Pneumococcal Conjugate-13 10/28/2013  . Pneumococcal Polysaccharide-23 07/26/2010  . Td 05/15/2002  . Tdap 10/24/2012    Plan  Recommended patient receive Shingrix vaccine.   Medication Management   [facility] Uses pill box? Yes Pt endorses 100% compliance  We discussed: n/a  Plan  Continue current medication management strategy  Follow up: 12 month phone visit  [place] Pharmacist [facility] [place]

## 2020-01-14 NOTE — Patient Instructions (Addendum)
Visit Information It was great speaking with you today!  Please let me know if you have any questions about our visit.  Goals Addressed            This Visit's Progress   . Chronic Care Management       CARE PLAN ENTRY (see longitudinal plan of care for additional care plan information)  Current Barriers:  . Chronic Disease Management support, education, and care coordination needs related to Hypertension, Hyperlipidemia, GERD and Osteoarthritis    Hypertension BP Readings from Last 3 Encounters:  09/18/19 120/74  05/21/19 114/73  05/16/19 116/72   . Pharmacist Clinical Goal(s): o Over the next 90 days, patient will work with PharmD and providers to maintain BP goal <130/80 . Current regimen:  . Amlodipine 10 mg daily  . Hydrochlorothiazide 25 mg daily  . Losartan 25 mg 2 tablets daily . Interventions: o Discussed low salt diet and exercising as tolerated extensively . Patient self care activities - Over the next 90 days, patient will: o Check blood pressure 3-4 times weekly, document, and provide at future appointments o Ensure daily salt intake < 2300 mg/day  Hyperlipidemia Lab Results  Component Value Date/Time   LDLCALC 103 (H) 10/17/2019 08:55 AM   LDLDIRECT 167.6 07/08/2009 08:54 AM   . Pharmacist Clinical Goal(s): o Over the next 90 days, patient will work with PharmD and providers to achieve LDL goal < 100 . Current regimen:  o Simvastatin 10 mg daily . Interventions: o Discussed low cholesterol diet and exercising as tolerated extensively  Medication management . Pharmacist Clinical Goal(s): o Over the next 90 days, patient will work with PharmD and providers to maintain optimal medication adherence . Current pharmacy: [facility] . Interventions o Comprehensive medication review performed. o Continue current medication management strategy . Patient self care activities - Over the next 90 days, patient will: o Take medications as prescribed o Report any  questions or concerns to PharmD and/or provider(s)       Mr. Zanetti was given information about Chronic Care Management services today including:  1. CCM service includes personalized support from designated clinical staff supervised by his physician, including individualized plan of care and coordination with other care providers 2. 24/7 contact phone numbers for assistance for urgent and routine care needs. 3. Standard insurance, coinsurance, copays and deductibles apply for chronic care management only during months in which we provide at least 20 minutes of these services. Most insurances cover these services at 100%, however patients may be responsible for any copay, coinsurance and/or deductible if applicable. This service may help you avoid the need for more expensive face-to-face services. 4. Only one practitioner may furnish and bill the service in a calendar month. 5. The patient may stop CCM services at any time (effective at the end of the month) by phone call to the office staff.  Patient agreed to services and verbal consent obtained.   The patient verbalized understanding of instructions provided today and agreed to receive a mailed copy of patient instruction and/or educational materials. Face to Face appointment with pharmacist scheduled for:  01/14/20 at 8:00 AM  [facility] [place]

## 2020-01-15 ENCOUNTER — Telehealth: Payer: Self-pay

## 2020-01-15 NOTE — Progress Notes (Signed)
Bessie,  Please reach out to Mr. Thull and let him know after discussion with Tommi Rumps, we will agreed on the following plan regarding his blood pressure medications.   -Stop Losartan -Start Olmesartan 20 mg daily  -Continue Amlodipine -Continue Hydrochlorothiazide   Thanks, Doristine Section Clinical Pharmacist [facility] [place]

## 2020-01-15 NOTE — Progress Notes (Signed)
Spoke to  patient per Junius Argyle Clinical Pharmacist after his  discussion with Tommi Rumps, they  agreed on the following plan regarding his blood pressure medications.   -Stop Losartan -Start Olmesartan 20 mg daily  -Continue Amlodipine -Continue Hydrochlorothiazide   Patient states he does not want to stop Losartan at this time because he just receive a 90 days supply on 01/12/2020. Patient states once he finish his 90 days supply he will stop Losartan and start Olmesartan.   [place] Pharmacist Assistant [phone]

## 2020-01-15 NOTE — Progress Notes (Signed)
I am unable to place any orders, refills, or labs for now. I will reach out to Mr. Lippmann to let him know about the change, but if you could please place the new Rx I would greatly appreciate it.   Thank you for your help!  Doristine Section Clinical Pharmacist [facility] [place]

## 2020-01-16 ENCOUNTER — Other Ambulatory Visit: Payer: Self-pay | Admitting: Adult Health

## 2020-01-16 MED ORDER — OLMESARTAN MEDOXOMIL 20 MG PO TABS: 20.0000 mg | ORAL_TABLET | Freq: Every day | ORAL | 3 refills | Status: DC

## 2020-01-27 DIAGNOSIS — R69 Illness, unspecified: Secondary | ICD-10-CM | POA: Diagnosis not present

## 2020-02-07 DIAGNOSIS — G4733 Obstructive sleep apnea (adult) (pediatric): Secondary | ICD-10-CM | POA: Diagnosis not present

## 2020-03-08 ENCOUNTER — Telehealth: Payer: Self-pay

## 2020-03-08 DIAGNOSIS — G4733 Obstructive sleep apnea (adult) (pediatric): Secondary | ICD-10-CM | POA: Diagnosis not present

## 2020-03-08 NOTE — Progress Notes (Addendum)
Chronic Care Management Pharmacy Assistant   Name: [name]  MRN: [id number] DOB: 27-Sep-1945  Reason for Encounter:Hypertension Disease State Call.  PCP : [name], NP  Allergies:   Allergies  Allergen Reactions   Lisinopril Cough    Medications: Outpatient Encounter Medications as of 03/08/2020  Medication Sig   amLODipine (NORVASC) 10 MG tablet Take 1 tablet (10 mg total) by mouth daily.   aspirin 81 MG tablet Take 81 mg by mouth daily.   hydrochlorothiazide (HYDRODIURIL) 25 MG tablet TAKE 1 TABLET BY MOUTH EVERY DAY   olmesartan (BENICAR) 20 MG tablet Take 1 tablet (20 mg total) by mouth daily.   omeprazole (PRILOSEC) 40 MG capsule TAKE 1 CAPSULE BY MOUTH EVERY DAY   simvastatin (ZOCOR) 10 MG tablet Take 1 tablet (10 mg total) by mouth at bedtime.   No facility-administered encounter medications on file as of 03/08/2020.    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 Spoke with patient regarding BP  Recent Office Vitals: BP Readings from Last 3 Encounters:  09/18/19 120/74  05/21/19 114/73  05/16/19 116/72   Pulse Readings from Last 3 Encounters:  05/21/19 65  05/16/19 80  03/27/18 (!) 57    Wt Readings from Last 3 Encounters:  09/18/19 274 lb (124.3 kg)  05/19/19 271 lb (122.9 kg)  03/27/18 270 lb (122.5 kg)     Kidney Function Lab Results  Component Value Date/Time   CREATININE  0.90 10/17/2019 08:55 AM   CREATININE 0.86 01/23/2018 02:05 PM   GFR 99.65 10/17/2019 08:55 AM   GFRNONAA 86 (L) 05/25/2011 10:00 AM   GFRAA >90 05/25/2011 10:00 AM    BMP Latest Ref Rng & Units 10/17/2019 01/23/2018 01/11/2017  Glucose 70 - 99 mg/dL 117(H) 99 129(H)  BUN 6 - 23 mg/dL 16 16 13   Creatinine 0.40 - 1.50 mg/dL 0.90 0.86 0.87  Sodium 135 - 145 mEq/L 139 137 139  Potassium 3.5 - 5.1 mEq/L 4.0 3.9 4.0  Chloride 96 - 112 mEq/L 102 101 102  CO2 19 - 32 mEq/L 29 30 29   Calcium 8.4 - 10.5 mg/dL 9.8 9.7 9.8    Current antihypertensive regimen:  Amlodipine 10 mg daily  Hydrochlorothiazide 25 mg daily  Losartan 25 MG Tablet Daily  How often are you checking your Blood Pressure? 1-2x per week Current home BP readings:  On 03/08/20 it was 120/85 On 10/27/2 it was 120/89 What recent interventions/DTPs have been made by any provider to improve Blood Pressure control since last CPP Visit: 01/15/20 PCP stop Losartan and start Olmesartan 20 MG daily  Patient states he has not started Olmesartan 20 MG due to the cost of $136. Patient is questioning if he can receive samples because he can not afford Olmesartan. Any recent hospitalizations or ED visits since last visit with CPP? No What diet changes have been made to improve Blood Pressure Control?  Patient states he cooks at home without using salt. What exercise is  being done to improve your Blood Pressure Control?  Patient reports he walks daily for 45 minutes.   Adherence Review: Is the patient currently on ACE/ARB medication? Yes Does the patient have >5 day gap between last estimated fill dates? Yes   [place] Pharmacist Assistant [phone]   CPA to review with RPH next wk - wk of 11/1.  Olmesartan is tier 2 but could try for tier exception if medical necessity is supported  Otherwise all the other ARBs - irbesartan, candasartan, telmisartan, valsartan, and losartan - are tier 1  options per [employer]na formulary

## 2020-04-10 ENCOUNTER — Other Ambulatory Visit: Payer: Self-pay | Admitting: Adult Health

## 2020-04-10 DIAGNOSIS — Z760 Encounter for issue of repeat prescription: Secondary | ICD-10-CM

## 2020-04-10 DIAGNOSIS — I10 Essential (primary) hypertension: Secondary | ICD-10-CM

## 2020-04-13 DIAGNOSIS — G4733 Obstructive sleep apnea (adult) (pediatric): Secondary | ICD-10-CM | POA: Diagnosis not present

## 2020-04-14 ENCOUNTER — Other Ambulatory Visit: Payer: Self-pay | Admitting: Adult Health

## 2020-04-14 DIAGNOSIS — Z760 Encounter for issue of repeat prescription: Secondary | ICD-10-CM

## 2020-04-14 DIAGNOSIS — I10 Essential (primary) hypertension: Secondary | ICD-10-CM

## 2020-04-19 ENCOUNTER — Other Ambulatory Visit: Payer: Self-pay | Admitting: Adult Health

## 2020-04-19 DIAGNOSIS — I10 Essential (primary) hypertension: Secondary | ICD-10-CM

## 2020-04-19 DIAGNOSIS — Z760 Encounter for issue of repeat prescription: Secondary | ICD-10-CM

## 2020-04-21 ENCOUNTER — Ambulatory Visit
Admission: EM | Admit: 2020-04-21 | Discharge: 2020-04-21 | Disposition: A | Discharge status: Home / Self Care | Payer: Medicare HMO | Attending: Emergency Medicine | Admitting: Emergency Medicine

## 2020-04-21 ENCOUNTER — Other Ambulatory Visit: Payer: Self-pay

## 2020-04-21 DIAGNOSIS — R42 Dizziness and giddiness: Secondary | ICD-10-CM | POA: Diagnosis not present

## 2020-04-21 DIAGNOSIS — I491 Atrial premature depolarization: Secondary | ICD-10-CM

## 2020-04-21 NOTE — Discharge Instructions (Addendum)
Important to go from sitting to standing, as well as bending over, slowly.   Continue all home medications. Keep track of symptoms, drink plenty of water, and follow-up with primary care in 1-2 weeks. In the meantime, please go to the ER if you develop any chest pain, palpitations, nausea, vomiting, weakness during these episodes, or you pass out.

## 2020-04-21 NOTE — ED Triage Notes (Signed)
Pt states for the last 3 days he has been getting dizzy upon waking when he bends over or moves his head rapidly. Pt states he is not dizzy now. Pt is aox4 and ambulates well with no apparent balance or orientation issues.

## 2020-04-21 NOTE — ED Provider Notes (Signed)
[facility]    CSN: [id number] Arrival date & time: 04/21/20  0910      History   Chief Complaint Chief Complaint  Patient presents with  . Dizziness    x 3 days    HPI [name] is a 74 y.o. male  With history as below presenting for 3-day course of dizziness upon standing or bending over.  Denies visual changes, room spinning sensation, nausea, vomiting, chest pain or palpitations.  Denies history of CVA, MI.  No recent change in medications, diet or lifestyle.  Followed routinely by his PCP reports compliance with medications.  States he called PCP's office who recommended going to urgent care or ER for evaluation.  Has not tried anything for this.  Past Medical History:  Diagnosis Date  . Arthritis   . Cataract    right eye removed   . Complication of anesthesia    "hard to wake up post op" per pt and wife   . GERD (gastroesophageal reflux disease)   . Hyperlipidemia   . Hypertension   . Neuromuscular disorder ([place])    pinched nerve in foot  . Sleep apnea    wears cpap     Patient Active Problem List   Diagnosis Date Noted  . OSA (obstructive sleep apnea) 04/16/2017  . 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 IMPAIRED GLUCOSE TOLERANCE 01/18/2010  . [place] DISEASE, CERVICAL 10/22/2008  . EXERCISE INDUCED BRONCHOSPASM 07/15/2007  . 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 DYSHIDROSIS 05/17/2007  . DEGENERATIVE JOINT DISEASE, BOTH KNEES, SEVERE 05/17/2007  . Essential hypertension 12/21/2006  . Disorder resulting from impaired renal function 12/21/2006    Past Surgical History:  Procedure Laterality Date  . CATARACT EXTRACTION Right   . COLONOSCOPY    . JOINT REPLACEMENT     bilateral knee replacenent  . TOTAL KNEE REVISION  05/30/2011   Procedure: TOTAL KNEE REVISION;  Surgeon: Meredith Pel, MD;  Location: [facility];  Service: Orthopedics;  Laterality: Right;  Right total knee arthroplasty polyethylene  spacer exchange       Home Medications    Prior to Admission medications   Medication Sig Start Date End Date Taking? Authorizing Provider  amLODipine (NORVASC) 10 MG tablet Take 1 tablet (10 mg total) by mouth daily. 10/17/19  Yes Nafziger, Tommi Rumps, NP  aspirin 81 MG tablet Take 81 mg by mouth daily.   Yes [provider]  hydrochlorothiazide (HYDRODIURIL) 25 MG tablet TAKE 1 TABLET BY MOUTH EVERY DAY 04/15/20  Yes Nafziger, Tommi Rumps, NP  losartan (COZAAR) 50 MG tablet Take 1 tablet (50 mg total) by mouth daily. 04/20/20 07/19/20 Yes Nafziger, Tommi Rumps, NP  olmesartan (BENICAR) 20 MG tablet Take 1 tablet (20 mg total) by mouth daily. 01/16/20  Yes Nafziger, Tommi Rumps, NP  omeprazole (PRILOSEC) 40 MG capsule TAKE 1 CAPSULE BY MOUTH EVERY DAY 10/16/19  Yes Nafziger, Tommi Rumps, NP  simvastatin (ZOCOR) 10 MG tablet Take 1 tablet (10 mg total) by mouth at bedtime. 10/17/19  Yes Nafziger, Tommi Rumps, NP    Family History Family History  Problem Relation Age of Onset  . Diabetes Sister   . Stroke Sister   . Colon cancer Neg Hx   . Colon polyps Neg Hx   . Esophageal cancer Neg Hx   . Rectal cancer Neg Hx   . Stomach cancer Neg Hx     Social History Social History   Tobacco Use  . Smoking status: Former Smoker  Quit date: 03/01/1967    Years since quitting: 53.1  . Smokeless tobacco: Never Used  . Tobacco comment: Pt states he was a social smoker  Vaping Use  . Vaping Use: Never used  Substance Use Topics  . Alcohol use: Yes    Comment: holidays  . Drug use: No     Allergies   Lisinopril   Review of Systems Review of Systems  Constitutional: Negative for fatigue and fever.  HENT: Negative for congestion, dental problem, ear pain, facial swelling, hearing loss, sinus pain, sore throat, trouble swallowing and voice change.   Eyes: Negative for photophobia, pain and visual disturbance.  Respiratory: Negative for cough and shortness of breath.   Cardiovascular: Negative for chest pain and  palpitations.  Gastrointestinal: Negative for diarrhea and vomiting.  Musculoskeletal: Negative for arthralgias and myalgias.  Neurological: Positive for dizziness and light-headedness. Negative for tremors, seizures, syncope, facial asymmetry, speech difficulty, weakness, numbness and headaches.  Psychiatric/Behavioral: Negative for behavioral problems, confusion and decreased concentration.     Physical Exam Triage Vital Signs ED Triage Vitals  Enc Vitals Group     BP      Pulse      Resp      Temp      Temp src      SpO2      Weight      Height      Head Circumference      Peak Flow      Pain Score      Pain Loc      Pain Edu?      Excl. in [place]?    Orthostatic VS for the past 24 hrs:  BP- Lying Pulse- Lying BP- Sitting Pulse- Sitting BP- Standing at 0 minutes Pulse- Standing at 0 minutes  04/21/20 1035 143/74 69 148/78 70 151/77 75    Updated Vital Signs BP (!) 148/77 (BP Location: Left Arm)   Pulse 80   Temp 98.2 F (36.8 C) (Oral)   Resp 18   SpO2 99%   Visual Acuity Right Eye Distance:   Left Eye Distance:   Bilateral Distance:    Right Eye Near:   Left Eye Near:    Bilateral Near:     Physical Exam Constitutional:      General: He is not in acute distress. HENT:     Head: Normocephalic and atraumatic.     Right Ear: Tympanic membrane, ear canal and external ear normal.     Left Ear: Tympanic membrane, ear canal and external ear normal.     Mouth/Throat:     Mouth: Mucous membranes are moist.     Pharynx: Oropharynx is clear.  Eyes:     General: No scleral icterus.    Extraocular Movements: Extraocular movements intact.     Conjunctiva/sclera: Conjunctivae normal.     Pupils: Pupils are equal, round, and reactive to light.  Cardiovascular:     Rate and Rhythm: Normal rate and regular rhythm.     Heart sounds: No murmur heard.  No gallop.      Comments: Few PVC's auscultated. Pulmonary:     Effort: Pulmonary effort is normal. No respiratory  distress.     Breath sounds: No wheezing or rales.  Musculoskeletal:        General: No deformity. Normal range of motion.     Cervical back: Normal range of motion. No rigidity or tenderness.     Right lower leg: Edema present.  Left lower leg: Edema present.     Comments: Trace pitting, chronic/stable per pt. NVI  Lymphadenopathy:     Cervical: No cervical adenopathy.  Skin:    Capillary Refill: Capillary refill takes less than 2 seconds.     Coloration: Skin is not jaundiced.     Findings: No bruising or rash.  Neurological:     Mental Status: He is alert.     Cranial Nerves: Cranial nerves are intact.     Sensory: Sensation is intact.     Motor: Motor function is intact.     Coordination: Coordination is intact.     Gait: Gait is intact.  Psychiatric:        Mood and Affect: Mood normal.        Behavior: Behavior normal.      UC Treatments / Results  Labs (all labs ordered are listed, but only abnormal results are displayed) Labs Reviewed - No data to display  EKG   Radiology No results found.  Procedures Procedures (including critical care time)  Medications Ordered in UC Medications - No data to display  Initial Impression / Assessment and Plan / UC Course  I have reviewed the triage vital signs and the nursing notes.  Pertinent labs & imaging results that were available during my care of the patient were reviewed by me and considered in my medical decision making (see chart for details).     Afebrile, nontoxic, and in no acute distress.  No neurocognitive deficit on exam.  EKG done office, reviewed by me compared to previous from 01/11/2016: Sinus rhythm with PACs.  Ventricular rate 67 bpm.  No QTC prolongation, ST elevation or depression.  Waveforms stable in all leads: Nonacute.  Orthostatic vitals reassuring at this time.  Low concern for acute process: We will follow up with PCP in 1-2 weeks for repeat evaluation.  Will keep track of symptoms in the  interim, continue home medications.  Return precautions discussed, pt verbalized understanding and is agreeable to plan. Final Clinical Impressions(s) / UC Diagnoses   Final diagnoses:  Dizziness  PAC (premature atrial contraction)     Discharge Instructions     Important to go from sitting to standing, as well as bending over, slowly.   Continue all home medications. Keep track of symptoms, drink plenty of water, and follow-up with primary care in 1-2 weeks. In the meantime, please go to the ER if you develop any chest pain, palpitations, nausea, vomiting, weakness during these episodes, or you pass out.    ED Prescriptions    None     PDMP not reviewed this encounter.   [place], [place], [place] 04/21/20 1043

## 2020-04-28 ENCOUNTER — Other Ambulatory Visit: Payer: Self-pay

## 2020-04-28 ENCOUNTER — Encounter: Payer: Self-pay | Admitting: Adult Health

## 2020-04-28 ENCOUNTER — Ambulatory Visit (INDEPENDENT_AMBULATORY_CARE_PROVIDER_SITE_OTHER): Payer: Medicare HMO | Admitting: Adult Health

## 2020-04-28 VITALS — BP 150/75 | HR 91 | Temp 98.1°F | Resp 18 | Ht 69.0 in | Wt 274.2 lb

## 2020-04-28 DIAGNOSIS — H8110 Benign paroxysmal vertigo, unspecified ear: Secondary | ICD-10-CM

## 2020-04-28 DIAGNOSIS — H6983 Other specified disorders of Eustachian tube, bilateral: Secondary | ICD-10-CM | POA: Diagnosis not present

## 2020-04-28 MED ORDER — MECLIZINE HCL 25 MG PO TABS: 25.0000 mg | ORAL_TABLET | Freq: Three times a day (TID) | ORAL | 0 refills | Status: DC | PRN

## 2020-04-28 MED ORDER — FLUTICASONE PROPIONATE 50 MCG/ACT NA SUSP: 2.0000 | Freq: Every day | NASAL | 6 refills | Status: DC

## 2020-04-28 NOTE — Progress Notes (Signed)
Subjective:    Patient ID: [name], male    DOB: May 12, 1946, 74 y.o.   MRN: [id number]  HPI 74 year old male who  has a past medical history of Arthritis, Cataract, Complication of anesthesia, GERD (gastroesophageal reflux disease), Hyperlipidemia, Hypertension, Neuromuscular disorder ([place]), and Sleep apnea.  He presents to the office today for 1 week follow-up regarding recent urgent care visit.  He was seen at urgent care on 04/21/2020 presenting for 3-day course of dizziness upon standing or bending over.  At this time he denied visual changes, room spinning sensation, nausea, vomiting, chest pain, or palpitations.  Urgent care his EKG showed sinus rhythm with PACs, ventricular rate of 67 bpm.  He had no QTC prolongation, ST depression or elevation.  Orthostatic vitals were reassuring while at urgent care.  He was advised to follow-up with PCP in 1 to 2 weeks for repeat evaluation  Today he reports that he continues to have dizziness " like the world is spinning" when he looks down. Often happens when he is shaving or brushing his teeth. Does not happen every day. Denies headaches, blurred vision, tinnitus, chest pain or shortness of breath. He does endorse associated " when I swallow it feels like my right ear pops.'  Review of Systems See HPI   Past Medical History:  Diagnosis Date  . Arthritis   . Cataract    right eye removed   . Complication of anesthesia    "hard to wake up post op" per pt and wife   . GERD (gastroesophageal reflux disease)   . Hyperlipidemia   . Hypertension   . Neuromuscular disorder ([place])    pinched nerve in foot  . Sleep apnea    wears cpap     Social History   Socioeconomic History  . Marital status: Married    Spouse name: Not on file  . Number of children: 2  . Years of education: 79  . Highest education level: High school graduate  Occupational History  . Occupation: retired  Tobacco Use  . Smoking status: Former Smoker    Quit  date: 03/01/1967    Years since quitting: 53.1  . Smokeless tobacco: Never Used  . Tobacco comment: Pt states he was a social smoker  Vaping Use  . Vaping Use: Never used  Substance and Sexual Activity  . Alcohol use: Yes    Comment: holidays  . Drug use: No  . Sexual activity: Yes  Other Topics Concern  . Not on file  Social History Narrative   Retired for an Research scientist (physical sciences) for a [employer]   Two children who live in [place]   Married; [facility] 2    Social Determinants of Health   Financial Resource Strain: [place]   . Difficulty of Paying Living Expenses: Not hard at all  Food Insecurity: Not on file  Transportation Needs: No Transportation Needs  . Lack of Transportation (Medical): No  . Lack of Transportation (Non-Medical): No  Physical Activity: Insufficiently Active  . Days of Exercise per Week: 7 days  . Minutes of Exercise per Session: 10 min  Stress: Not on file  Social Connections: Unknown  . Frequency of Communication with Friends and Family: More than three times a week  . Frequency of Social Gatherings with Friends and Family: Once a week  . Attends Religious Services: Not on file  . Active Member of Clubs or Organizations: Not on file  . Attends Archivist Meetings:  Not on file  . Marital Status: Married  Human resources officer Violence: Not on file    Past Surgical History:  Procedure Laterality Date  . CATARACT EXTRACTION Right   . COLONOSCOPY    . JOINT REPLACEMENT     bilateral knee replacenent  . TOTAL KNEE REVISION  05/30/2011   Procedure: TOTAL KNEE REVISION;  Surgeon: Meredith Pel, MD;  Location: [facility];  Service: Orthopedics;  Laterality: Right;  Right total knee arthroplasty polyethylene spacer exchange    Family History  Problem Relation Age of Onset  . Diabetes Sister   . Stroke Sister   . Colon cancer Neg Hx   . Colon polyps Neg Hx   . Esophageal cancer Neg Hx   . Rectal cancer Neg Hx   . Stomach cancer Neg Hx      Allergies  Allergen Reactions  . Lisinopril Cough    Current Outpatient Medications on File Prior to Visit  Medication Sig Dispense Refill  . amLODipine (NORVASC) 10 MG tablet Take 1 tablet (10 mg total) by mouth daily. 90 tablet 2  . aspirin 81 MG tablet Take 81 mg by mouth daily.    . hydrochlorothiazide (HYDRODIURIL) 25 MG tablet TAKE 1 TABLET BY MOUTH EVERY DAY 90 tablet 0  . losartan (COZAAR) 50 MG tablet Take 1 tablet (50 mg total) by mouth daily. 90 tablet 1  . olmesartan (BENICAR) 20 MG tablet Take 1 tablet (20 mg total) by mouth daily. 90 tablet 3  . omeprazole (PRILOSEC) 40 MG capsule TAKE 1 CAPSULE BY MOUTH EVERY DAY 90 capsule 3  . simvastatin (ZOCOR) 10 MG tablet Take 1 tablet (10 mg total) by mouth at bedtime. (Patient not taking: Reported on 04/28/2020) 90 tablet 3   No current facility-administered medications on file prior to visit.    BP (!) 150/75   Pulse 91   Temp 98.1 F (36.7 C)   Resp 18   Ht 5\' 9"  (1.753 m)   Wt 274 lb 3.2 oz (124.4 kg)   SpO2 99%   BMI 40.49 kg/m       Objective:   Physical Exam Vitals and nursing note reviewed.  Constitutional:      Appearance: Normal appearance. He is obese.  HENT:     Head: Normocephalic and atraumatic.     Right Ear: Ear canal and external ear normal. A middle ear effusion is present. There is no impacted cerumen.     Left Ear: Ear canal and external ear normal. A middle ear effusion is present. There is no impacted cerumen.     Nose: Nose normal. No congestion.     Mouth/Throat:     Mouth: Mucous membranes are moist.  Eyes:     Extraocular Movements: Extraocular movements intact.     Pupils: Pupils are equal, round, and reactive to light.  Cardiovascular:     Rate and Rhythm: Normal rate and regular rhythm.     Pulses: Normal pulses.     Heart sounds: Normal heart sounds.  Pulmonary:     Breath sounds: Normal breath sounds.  Neurological:     General: No focal deficit present.     Mental  Status: He is alert and oriented to person, place, and time.  Psychiatric:        Mood and Affect: Mood normal.        Behavior: Behavior normal.        Thought Content: Thought content normal.  Judgment: Judgment normal.       Assessment & Plan:  1. Benign paroxysmal positional vertigo, unspecified laterality - Appears as BPPV but will order Carotid US to r/o carotid artery disease.  - Follow up if not resolved in the next 10-14 days or sooner if symptoms worsen  - fluticasone (FLONASE) 50 MCG/ACT nasal spray; Place 2 sprays into both nostrils daily.  Dispense: 16 g; Refill: 6 - meclizine (ANTIVERT) 25 MG tablet; Take 1 tablet (25 mg total) by mouth 3 (three) times daily as needed for dizziness.  Dispense: 30 tablet; Refill: 0 - VAS US CAROTID; Future  2. Eustachian tube dysfunction, bilateral  - fluticasone (FLONASE) 50 MCG/ACT nasal spray; Place 2 sprays into both nostrils daily.  Dispense: 16 g; Refill: 6   [name], NP

## 2020-04-28 NOTE — Patient Instructions (Signed)
It was great seeing you today   I think you have an inner ear disorder called Vertigo. I have sent in a prescription for two medications. Use the flonase every day for two weeks and use the Meclizine only if you get dizzy ( meclizine can cause drowsiness so be careful when you first take this)   I have also ordered ultrasound of the carotid arteries to make sure that the blood flow to the brain is good.

## 2020-04-30 ENCOUNTER — Ambulatory Visit (HOSPITAL_COMMUNITY)
Admission: RE | Admit: 2020-04-30 | Discharge: 2020-04-30 | Disposition: A | Discharge status: Home / Self Care | Payer: Medicare HMO | Source: Ambulatory Visit | Attending: Adult Health | Admitting: Adult Health

## 2020-04-30 ENCOUNTER — Other Ambulatory Visit: Payer: Self-pay

## 2020-04-30 DIAGNOSIS — H8110 Benign paroxysmal vertigo, unspecified ear: Secondary | ICD-10-CM | POA: Insufficient documentation

## 2020-05-13 DIAGNOSIS — G4733 Obstructive sleep apnea (adult) (pediatric): Secondary | ICD-10-CM | POA: Diagnosis not present

## 2020-06-13 DIAGNOSIS — G4733 Obstructive sleep apnea (adult) (pediatric): Secondary | ICD-10-CM | POA: Diagnosis not present

## 2020-06-30 ENCOUNTER — Other Ambulatory Visit: Payer: Self-pay | Admitting: Adult Health

## 2020-06-30 DIAGNOSIS — Z760 Encounter for issue of repeat prescription: Secondary | ICD-10-CM

## 2020-06-30 DIAGNOSIS — I10 Essential (primary) hypertension: Secondary | ICD-10-CM

## 2020-07-01 NOTE — Telephone Encounter (Signed)
Sent to the pharmacy by e-scribe. 

## 2020-07-13 DIAGNOSIS — G4733 Obstructive sleep apnea (adult) (pediatric): Secondary | ICD-10-CM | POA: Diagnosis not present

## 2020-08-05 ENCOUNTER — Other Ambulatory Visit: Payer: Self-pay | Admitting: Sports Medicine

## 2020-08-05 ENCOUNTER — Other Ambulatory Visit: Payer: Self-pay

## 2020-08-05 ENCOUNTER — Ambulatory Visit: Payer: Medicare HMO | Admitting: Sports Medicine

## 2020-08-05 ENCOUNTER — Ambulatory Visit (INDEPENDENT_AMBULATORY_CARE_PROVIDER_SITE_OTHER): Payer: Self-pay

## 2020-08-05 ENCOUNTER — Encounter: Payer: Self-pay | Admitting: Sports Medicine

## 2020-08-05 DIAGNOSIS — M79671 Pain in right foot: Secondary | ICD-10-CM

## 2020-08-05 DIAGNOSIS — M792 Neuralgia and neuritis, unspecified: Secondary | ICD-10-CM

## 2020-08-05 DIAGNOSIS — M19071 Primary osteoarthritis, right ankle and foot: Secondary | ICD-10-CM

## 2020-08-05 DIAGNOSIS — M779 Enthesopathy, unspecified: Secondary | ICD-10-CM

## 2020-08-05 DIAGNOSIS — M7741 Metatarsalgia, right foot: Secondary | ICD-10-CM

## 2020-08-05 DIAGNOSIS — M7742 Metatarsalgia, left foot: Secondary | ICD-10-CM

## 2020-08-05 DIAGNOSIS — M79672 Pain in left foot: Secondary | ICD-10-CM

## 2020-08-05 DIAGNOSIS — M19079 Primary osteoarthritis, unspecified ankle and foot: Secondary | ICD-10-CM | POA: Diagnosis not present

## 2020-08-05 DIAGNOSIS — M19072 Primary osteoarthritis, left ankle and foot: Secondary | ICD-10-CM

## 2020-08-05 MED ORDER — GABAPENTIN 300 MG PO CAPS: 300.0000 mg | ORAL_CAPSULE | Freq: Every day | ORAL | 1 refills | Status: DC

## 2020-08-05 NOTE — Progress Notes (Signed)
Subjective: [name] is a 75 y.o. male patient who presents to office for evaluation of bilateral foot pain. Patient complains of progressive pain consisting of burning and tingling to all toes for the last month patient also admits that he has a history of pinched nerve from previous knee surgery in right lower extremity.  Patient also reports that he has had significant arthritis everywhere and even some numbness and tingling in his hands.  Patient denies any current back pain.patient also request nails to be trimmed.  Patient denies any other pedal complaints. Denies injury/trip/fall/sprain/any causative factors.   Review of system noncontributory.  Patient Active Problem List   Diagnosis Date Noted  . OSA (obstructive sleep apnea) 04/16/2017  . 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 IMPAIRED GLUCOSE TOLERANCE 01/18/2010  . Edgar DISEASE, CERVICAL 10/22/2008  . EXERCISE INDUCED BRONCHOSPASM 07/15/2007  . 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 DYSHIDROSIS 05/17/2007  . DEGENERATIVE JOINT DISEASE, BOTH KNEES, SEVERE 05/17/2007  . Essential hypertension 12/21/2006  . Disorder resulting from impaired renal function 12/21/2006    Current Outpatient Medications on File Prior to Visit  Medication Sig Dispense Refill  . amLODipine (NORVASC) 10 MG tablet TAKE 1 TABLET BY MOUTH EVERY DAY 90 tablet 0  . aspirin 81 MG tablet Take 81 mg by mouth daily.    . fluticasone (FLONASE) 50 MCG/ACT nasal spray Place 2 sprays into both nostrils daily. 16 g 6  . hydrochlorothiazide (HYDRODIURIL) 25 MG tablet TAKE 1 TABLET BY MOUTH EVERY DAY 90 tablet 0  . meclizine (ANTIVERT) 25 MG tablet Take 1 tablet (25 mg total) by mouth 3 (three) times daily as needed for dizziness. 30 tablet 0  . olmesartan (BENICAR) 20 MG tablet Take 1 tablet (20 mg total) by mouth daily. 90 tablet 3  . omeprazole (PRILOSEC) 40 MG capsule TAKE 1 CAPSULE BY MOUTH EVERY DAY 90 capsule 3  .  simvastatin (ZOCOR) 10 MG tablet Take 1 tablet (10 mg total) by mouth at bedtime. 90 tablet 3  . losartan (COZAAR) 50 MG tablet Take 1 tablet (50 mg total) by mouth daily. 90 tablet 1   No current facility-administered medications on file prior to visit.    Allergies  Allergen Reactions  . Lisinopril Cough    Objective:  General: Alert and oriented x3 in no acute distress  Dermatology: No open lesions bilateral lower extremities, no webspace macerations, no ecchymosis bilateral, all nails x 10 are long with subungual debris consistent with onychomycosis.  Vascular: Dorsalis Pedis and Posterior Tibial pedal pulses palpable, Capillary Fill Time 3 seconds,(+) pedal hair growth bilateral, no edema bilateral lower extremities, Temperature gradient within normal limits.  Neurology: Johney Maine sensation intact via light touch bilateral, Protective sensation slightly diminished bilateral likely consistent with early neuritis and neuropathy could be a result of his back or a result of prediabetes.  Musculoskeletal: No reproducible tenderness to palpation noted bilateral however patient does have subjective tingling to all toes.  Pes planus foot type.  Limited first MPJ range of motion bilateral left greater than right.  Gait: Minimally antalgic gait  Xrays  Left and right foot    Impression: There is joint space narrowing at the first MPJ greater than midfoot left greater than right consistent with arthritis, there is midtarsal breech supportive of pes planus deformity  Assessment and Plan: Problem List Items Addressed This Visit   None   Visit Diagnoses    Pain in right foot    -  Primary   Pain in left foot       Metatarsalgia of both feet       Capsulitis       Neuritis       Arthritis of foot           -Complete examination performed -Xrays reviewed -Discussed treatment options -Rx gabapentin for patient to take at bedtime to see if this will give relief to his nerve symptoms  advised patient that if his pain continues likely he may need further work-up for neuropathy or diabetes -Advised patient to wear good supportive shoes daily for foot type -Encouraged gentle range of motion as tolerated -At no additional charge mechanically debrided nails x10 using a sterile nail nipper -Patient to return to office in 3 months for medication check or sooner if condition worsens.  [name], DPM

## 2020-08-09 ENCOUNTER — Telehealth: Payer: Self-pay | Admitting: Pharmacist

## 2020-08-09 NOTE — Chronic Care Management (AMB) (Signed)
Chronic Care Management Pharmacy Assistant   Name: [name]  MRN: [id number] DOB: 1946/04/23  Reason for Encounter: Disease State   Conditions to be addressed/monitored: HTN  Recent office visits:  12.15.2021 [name], NP Family Medicine . Fluticasone Propionate 50 MCG/ACT 2 sprays Each Nare Daily . Meclizine HCl 25 mg Oral 3 times daily PRN  Recent consult visits:  03.24.2022 [name], DPM Podiatry . Gabapentin 300 mg Oral Daily at bedtime  Hospital visits:  Medication Reconciliation was completed by comparing discharge summary, patient's EMR and Pharmacy list, and upon discussion with patient.  Admitted to the Urgent Care on 12.08.2021 due to Dizziness. Discharge date was 12.08.2021 . Discharged from [facility].    Medications that remain the same after Hospital Discharge:??  -All other medications will remain the same.    Medications: Outpatient Encounter Medications as of 08/09/2020  Medication Sig  . amLODipine (NORVASC) 10 MG tablet TAKE 1 TABLET BY MOUTH EVERY DAY  . aspirin 81 MG tablet Take 81 mg by mouth daily.  . fluticasone (FLONASE) 50 MCG/ACT nasal spray Place 2 sprays into both nostrils daily.  Marland Kitchen gabapentin (NEURONTIN) 300 MG capsule Take 1 capsule (300 mg total) by mouth at bedtime.  . hydrochlorothiazide (HYDRODIURIL) 25 MG tablet TAKE 1 TABLET BY MOUTH EVERY DAY  . losartan (COZAAR) 50 MG tablet Take 1 tablet (50 mg total) by mouth daily.  . meclizine (ANTIVERT) 25 MG tablet Take 1 tablet (25 mg total) by mouth 3 (three) times daily as needed for dizziness.  Marland Kitchen olmesartan (BENICAR) 20 MG tablet Take 1 tablet (20 mg total) by mouth daily.  Marland Kitchen omeprazole (PRILOSEC) 40 MG capsule TAKE 1 CAPSULE BY MOUTH EVERY DAY  . simvastatin (ZOCOR) 10 MG tablet Take 1 tablet (10 mg total) by mouth at bedtime.   No facility-administered encounter medications on file as of 08/09/2020.   Reviewed chart prior to disease  state call. Spoke with patient regarding BP  Recent Office Vitals: BP Readings from Last 3 Encounters:  04/28/20 (!) 150/75  04/21/20 (!) 148/77  09/18/19 120/74   Pulse Readings from Last 3 Encounters:  04/28/20 91  04/21/20 80  05/21/19 65    Wt Readings from Last 3 Encounters:  04/28/20 274 lb 3.2 oz (124.4 kg)  09/18/19 274 lb (124.3 kg)  05/19/19 271 lb (122.9 kg)     Kidney Function Lab Results  Component Value Date/Time   CREATININE 0.90 10/17/2019 08:55 AM   CREATININE 0.86 01/23/2018 02:05 PM   GFR 99.65 10/17/2019 08:55 AM   GFRNONAA 86 (L) 05/25/2011 10:00 AM   GFRAA >90 05/25/2011 10:00 AM    BMP Latest Ref Rng & Units 10/17/2019 01/23/2018 01/11/2017  Glucose 70 - 99 mg/dL 117(H) 99 129(H)  BUN 6 - 23 mg/dL 16 16 13   Creatinine 0.40 - 1.50 mg/dL 0.90 0.86 0.87  Sodium 135 - 145 mEq/L 139 137 139  Potassium 3.5 - 5.1 mEq/L 4.0 3.9 4.0  Chloride 96 - 112 mEq/L 102 101 102  CO2 19 - 32 mEq/L 29 30 29   Calcium 8.4 - 10.5 mg/dL 9.8 9.7 9.8   . Current antihypertensive regimen:  o Hydrochlorothiazide (HYDRODIURIL) 25 mg one tablet daily  o Losartan (COZAAR) 50 mg 1 tablet daily  o Amlodipine (NORVASC) 10 mg one tablet daily o Aspirin 81 mg daily . How often are you checking your Blood Pressure? daily . Current home BP readings:  o 03.26 136/86 o  03.27 134/80 o 03.28 128/82 o 03.29 130/82 o 03.30 126/78 . What recent interventions/DTPs have been made by any provider to improve Blood Pressure control since last CPP Visit: Losartan increased from 25 mg to 50 mg daily. . Any recent hospitalizations or ED visits since last visit with CPP? No . What diet changes have been made to improve Blood Pressure Control?  o Less sodium in his diet and increase in water . What exercise is being done to improve your Blood Pressure Control?  o Walks 3-4 times a week  Adherence Review: Is the patient currently on ACE/ARB medication? Yes Does the patient have >5 day gap  between last estimated fill dates? No I spoke with the patient and discussed medication adherence. There are no issues currently with the current medication. He states that she does not have any issues with his medication. He states he is continuing his diet and activities. HE denies any emergency department visits since his last CPP or PCP follow-up. He did make two future appointments. A complete physical with his PCP on May 12th at 7:30 AM and a CCM appointment for June 14th at 10 AM. He denies any side effects from his medication. Also, he denies any problems with his current pharmacy.   Star Rating Drugs:  Dispensed Quantity Pharmacy  Losartan 20 mg 03.05.2022 [facility]   Amilia Revonda [place], [place] [phone]

## 2020-08-13 DIAGNOSIS — G4733 Obstructive sleep apnea (adult) (pediatric): Secondary | ICD-10-CM | POA: Diagnosis not present

## 2020-08-27 ENCOUNTER — Telehealth: Payer: Self-pay | Admitting: Pharmacist

## 2020-08-27 NOTE — Chronic Care Management (AMB) (Addendum)
Chronic Care Management Pharmacy Assistant   Name: BISHOY CUPP  MRN: [id number] DOB: 01/06/1946  Reason for Encounter: Disease State-HDL Adherence Call      Conditions to be addressed/monitored: HLD  Recent office visits:  None  Recent consult visits:  None  Hospital visits:  None in previous 6 months  Medications: Outpatient Encounter Medications as of 08/27/2020  Medication Sig   amLODipine (NORVASC) 10 MG tablet TAKE 1 TABLET BY MOUTH EVERY DAY   aspirin 81 MG tablet Take 81 mg by mouth daily.   fluticasone (FLONASE) 50 MCG/ACT nasal spray Place 2 sprays into both nostrils daily.   gabapentin (NEURONTIN) 300 MG capsule Take 1 capsule (300 mg total) by mouth at bedtime.   hydrochlorothiazide (HYDRODIURIL) 25 MG tablet TAKE 1 TABLET BY MOUTH EVERY DAY   losartan (COZAAR) 50 MG tablet Take 1 tablet (50 mg total) by mouth daily.   meclizine (ANTIVERT) 25 MG tablet Take 1 tablet (25 mg total) by mouth 3 (three) times daily as needed for dizziness.   olmesartan (BENICAR) 20 MG tablet Take 1 tablet (20 mg total) by mouth daily.   omeprazole (PRILOSEC) 40 MG capsule TAKE 1 CAPSULE BY MOUTH EVERY DAY   simvastatin (ZOCOR) 10 MG tablet Take 1 tablet (10 mg total) by mouth at bedtime.   No facility-administered encounter medications on file as of 08/27/2020.    08/30/2020 Name: JSON KOELZER MRN: [id number] DOB: 12-08-1945 Raelyn Mora is a 75 y.o. year old male who is a primary care patient of [name], NP.  Comprehensive medication review performed; Spoke to patient regarding cholesterol  Lipid Panel    Component Value Date/Time   CHOL 161 10/17/2019 0855   TRIG 52.0 10/17/2019 0855   TRIG 59 05/24/2006 0906   HDL 47.00 10/17/2019 0855   LDLCALC 103 (H) 10/17/2019 0855   LDLDIRECT 167.6 07/08/2009 0854    10-year ASCVD risk score: The 10-year ASCVD risk score Mikey Bussing DC Brooke Bonito., et al., 2013) is: 46%   Values used to calculate the score:     Age: 63 years      Sex: Male     Is Non-Hispanic African American: Yes     Diabetic: Yes     Tobacco smoker: No     Systolic Blood Pressure: 782 mmHg     Is BP treated: Yes     HDL Cholesterol: 47 mg/dL     Total Cholesterol: 161 mg/dL  Current antihyperlipidemic regimen:   Simvastatin 10 mg tablets daily Previous antihyperlipidemic medications tried: None ASCVD risk enhancing conditions: HTN What recent interventions/DTPs have been made by any provider to improve Cholesterol control since last CPP Visit: None Any recent hospitalizations or ED visits since last visit with CPP? No What diet changes have been made to improve Cholesterol?  decreased fried food intake What exercise is being done to improve Cholesterol?  Increased walking by 10 minutes daily  Adherence Review: Does the patient have >5 day gap between last estimated fill dates? No  I spoke with the patient and discussed medication adherence. There are no issues currently with the current medication. He states that she does not have any issues with his medication. He states that he has reduced the fried foods that are in his diet. And he tries to walk at least 10 minutes longer each day. He denies any emergency department visits / urgent care visits since his last CPP or PCP visit. He denies any side effects from his prescriptions  also he denies any problems with his current pharmacy.  Star Rating Drugs: Simvastatin Filled 07/06/20 for 90 ds at [facility] [place], [place] [phone]

## 2020-09-11 ENCOUNTER — Encounter: Payer: Self-pay | Admitting: Family Medicine

## 2020-09-11 ENCOUNTER — Telehealth (INDEPENDENT_AMBULATORY_CARE_PROVIDER_SITE_OTHER): Payer: Medicare HMO | Admitting: Family Medicine

## 2020-09-11 DIAGNOSIS — J111 Influenza due to unidentified influenza virus with other respiratory manifestations: Secondary | ICD-10-CM | POA: Diagnosis not present

## 2020-09-11 MED ORDER — OSELTAMIVIR PHOSPHATE 75 MG PO CAPS: 75.0000 mg | ORAL_CAPSULE | Freq: Two times a day (BID) | ORAL | 0 refills | Status: DC

## 2020-09-11 NOTE — Progress Notes (Signed)
   [name] - 75 y.o. male  MRN [id number]  Date of Birth: 1945-09-29  PCP: [name], NP  This service was provided via telemedicine. Phone Visit performed on 09/11/2020    Rationale for phone visit along with limitations reviewed. I discussed the limitations, risks, security and privacy concerns of performing a phone visit and the availability of in person appointments. I also discussed with the patient that there may be a patient responsible charge related to this service. Patient consented to telephone encounter.    Location of patient: home Location of provider: in office Home @ [facility] Name of referring provider: N/A   Names of persons and role in encounter: Provider: [name], MD  Patient: [name]  Other: N/A   Time on call: 11:10am - 11:19am   Subjective: Chief Complaint  Patient presents with  . Cough    X 2 days. Cough is not productive. No fever, congestion or headache. Has had 3 covid vaccinations. Grand kids were sick before him. They tested positive for flu and neg for covid testing. He has not had any testing.      HPI:  2 d h/o dry cough.  No fevers/chills, ear or tooth pain, ST, HA, body aches, abd pain, nausea, diarrhea, dyspnea or wheezing. No coughing fits.  Hasn't tried anything for this yet besides cough drops.   Known OSA on CPAP, HTN.  No h/o asthma Non smoker  Recent sick contacts: He was around grandchildren this week who subsequently tested positive for flu, negative for COVID.  He did receive influenza vaccine 01/27/2020.    Objective/Observations:  No physical exam or vital signs collected unless specifically identified below.   Temp 98.7 F (37.1 C) (Temporal)   Ht 5\' 9"  (1.753 m)   Wt 274 lb (124.3 kg)   BMI 40.46 kg/m    Respiratory status: speaks in complete sentences without evident shortness of breath.   Assessment/Plan:  Influenza Treat presumptively as influenza given recent increased  cough and recent influenza exposure (grandchildren). Rx tamiflu bid 5d course. >6 months since influenza vaccine. Red flags to seek further in -person care reviewed. Update if not improving with treatment.    I discussed the assessment and treatment plan with the patient. The patient was provided an opportunity to ask questions and all were answered. The patient agreed with the plan and demonstrated an understanding of the instructions.  Lab Orders  No laboratory test(s) ordered today    Meds ordered this encounter  Medications  . oseltamivir (TAMIFLU) 75 MG capsule    Sig: Take 1 capsule (75 mg total) by mouth 2 (two) times daily.    Dispense:  10 capsule    Refill:  0    The patient was advised to call back or seek an in-person evaluation if the symptoms worsen or if the condition fails to improve as anticipated.  [name], MD

## 2020-09-11 NOTE — Assessment & Plan Note (Addendum)
Treat presumptively as influenza given recent increased cough and recent influenza exposure (grandchildren). Rx tamiflu bid 5d course. >6 months since influenza vaccine. Red flags to seek further in -person care reviewed. Update if not improving with treatment.

## 2020-09-12 DIAGNOSIS — G4733 Obstructive sleep apnea (adult) (pediatric): Secondary | ICD-10-CM | POA: Diagnosis not present

## 2020-09-17 ENCOUNTER — Other Ambulatory Visit: Payer: Self-pay | Admitting: Adult Health

## 2020-09-17 DIAGNOSIS — I10 Essential (primary) hypertension: Secondary | ICD-10-CM

## 2020-09-17 DIAGNOSIS — Z760 Encounter for issue of repeat prescription: Secondary | ICD-10-CM

## 2020-09-23 ENCOUNTER — Ambulatory Visit (INDEPENDENT_AMBULATORY_CARE_PROVIDER_SITE_OTHER): Payer: Medicare HMO | Admitting: Adult Health

## 2020-09-23 ENCOUNTER — Other Ambulatory Visit: Payer: Self-pay

## 2020-09-23 ENCOUNTER — Encounter: Payer: Self-pay | Admitting: Adult Health

## 2020-09-23 VITALS — BP 120/70 | HR 78 | Temp 97.9°F | Ht 69.0 in | Wt 270.4 lb

## 2020-09-23 DIAGNOSIS — Z0001 Encounter for general adult medical examination with abnormal findings: Secondary | ICD-10-CM

## 2020-09-23 DIAGNOSIS — I10 Essential (primary) hypertension: Secondary | ICD-10-CM

## 2020-09-23 DIAGNOSIS — Z125 Encounter for screening for malignant neoplasm of prostate: Secondary | ICD-10-CM

## 2020-09-23 DIAGNOSIS — G4733 Obstructive sleep apnea (adult) (pediatric): Secondary | ICD-10-CM

## 2020-09-23 DIAGNOSIS — E785 Hyperlipidemia, unspecified: Secondary | ICD-10-CM | POA: Diagnosis not present

## 2020-09-23 LAB — CBC WITH DIFFERENTIAL/PLATELET
Basophils Absolute: 0 10*3/uL (ref 0.0–0.1)
Basophils Relative: 0.9 % (ref 0.0–3.0)
Eosinophils Absolute: 0.1 10*3/uL (ref 0.0–0.7)
Eosinophils Relative: 2.3 % (ref 0.0–5.0)
HCT: 37.3 % — ABNORMAL LOW (ref 39.0–52.0)
Hemoglobin: 12.9 g/dL — ABNORMAL LOW (ref 13.0–17.0)
Lymphocytes Relative: 43.7 % (ref 12.0–46.0)
Lymphs Abs: 2 10*3/uL (ref 0.7–4.0)
MCHC: 34.5 g/dL (ref 30.0–36.0)
MCV: 89.6 fl (ref 78.0–100.0)
Monocytes Absolute: 0.5 10*3/uL (ref 0.1–1.0)
Monocytes Relative: 11.7 % (ref 3.0–12.0)
Neutro Abs: 1.9 10*3/uL (ref 1.4–7.7)
Neutrophils Relative %: 41.4 % — ABNORMAL LOW (ref 43.0–77.0)
Platelets: 279 10*3/uL (ref 150.0–400.0)
RBC: 4.17 Mil/uL — ABNORMAL LOW (ref 4.22–5.81)
RDW: 12.3 % (ref 11.5–15.5)
WBC: 4.5 10*3/uL (ref 4.0–10.5)

## 2020-09-23 LAB — COMPREHENSIVE METABOLIC PANEL
ALT: 12 U/L (ref 0–53)
AST: 14 U/L (ref 0–37)
Albumin: 4.1 g/dL (ref 3.5–5.2)
Alkaline Phosphatase: 89 U/L (ref 39–117)
BUN: 18 mg/dL (ref 6–23)
CO2: 28 mEq/L (ref 19–32)
Calcium: 9.6 mg/dL (ref 8.4–10.5)
Chloride: 102 mEq/L (ref 96–112)
Creatinine, Ser: 1.01 mg/dL (ref 0.40–1.50)
GFR: 72.73 mL/min (ref >60.00)
Glucose, Bld: 130 mg/dL — ABNORMAL HIGH (ref 70–99)
Potassium: 4 mEq/L (ref 3.5–5.1)
Sodium: 139 mEq/L (ref 135–145)
Total Bilirubin: 0.8 mg/dL (ref 0.2–1.2)
Total Protein: 7.3 g/dL (ref 6.0–8.3)

## 2020-09-23 LAB — LIPID PANEL
Cholesterol: 135 mg/dL (ref 0–200)
HDL: 37.8 mg/dL — ABNORMAL LOW (ref >39.00)
LDL Cholesterol: 84 mg/dL (ref 0–99)
NonHDL: 97.37
Total CHOL/HDL Ratio: 4
Triglycerides: 65 mg/dL (ref 0.0–149.0)
VLDL: 13 mg/dL (ref 0.0–40.0)

## 2020-09-23 LAB — TSH: TSH: 1.21 u[IU]/mL (ref 0.35–4.50)

## 2020-09-23 LAB — PSA: PSA: 3.56 ng/mL (ref 0.10–4.00)

## 2020-09-23 MED ORDER — AMLODIPINE BESYLATE 10 MG PO TABS: 1.0000 | ORAL_TABLET | Freq: Every day | ORAL | 3 refills | Status: DC

## 2020-09-23 MED ORDER — LOSARTAN POTASSIUM 50 MG PO TABS: 50.0000 mg | ORAL_TABLET | Freq: Every day | ORAL | 3 refills | Status: DC

## 2020-09-23 MED ORDER — OMEPRAZOLE 40 MG PO CPDR: DELAYED_RELEASE_CAPSULE | ORAL | 3 refills | Status: DC

## 2020-09-23 NOTE — Addendum Note (Signed)
Addended by: Elmer Picker on: 09/23/2020 07:56 AM   Modules accepted: Orders

## 2020-09-23 NOTE — Progress Notes (Signed)
Subjective:    Patient ID: [name], male    DOB: 04/29/1946, 75 y.o.   MRN: [id number]  HPI Patient presents for yearly preventative medicine examination. He is a pleasant 75 year old male who  has a past medical history of Arthritis, Cataract, Complication of anesthesia, GERD (gastroesophageal reflux disease), Hyperlipidemia, Hypertension, Neuromuscular disorder ([place]), and Sleep apnea.  Essential hypertension-takes Norvasc 10 mg, HCTZ 25 mg, Benicar 20 mg,  and Cozaar 50 mg daily.  He denies dizziness, lightheadedness, chest pain, or shortness of breath BP Readings from Last 3 Encounters:  09/23/20 120/70  04/28/20 (!) 150/75  04/21/20 (!) 148/77   Hyperlipidemia-prescribed Zocor 10 mg daily.  He denies myalgia or fatigue Lab Results  Component Value Date   CHOL 161 10/17/2019   HDL 47.00 10/17/2019   LDLCALC 103 (H) 10/17/2019   LDLDIRECT 167.6 07/08/2009   TRIG 52.0 10/17/2019   CHOLHDL 3 10/17/2019   OSA- CPAP on a nightly basis reports restful sleep when using, denies daytime somnolence, waking up feeling fatigued, or the need for an afternoon nap   All immunizations and health maintenance protocols were reviewed with the patient and needed orders were placed.  Appropriate screening laboratory values were ordered for the patient including screening of hyperlipidemia, renal function and hepatic function. If indicated by BPH, a PSA was ordered.  Medication reconciliation,  past medical history, social history, problem list and allergies were reviewed in detail with the patient  Goals were established with regard to weight loss, exercise, and  diet in compliance with medications. He is walking every morning. Does not follow a specific diet.  Wt Readings from Last 3 Encounters:  09/23/20 270 lb 6.4 oz (122.7 kg)  09/11/20 274 lb (124.3 kg)  04/28/20 274 lb 3.2 oz (124.4 kg)    He has no acute complaints   Review of Systems  Constitutional: Negative.   HENT:  Negative.   Eyes: Negative.   Respiratory: Negative.   Cardiovascular: Negative.   Gastrointestinal: Negative.   Endocrine: Negative.   Genitourinary: Negative.   Musculoskeletal: Positive for arthralgias.  Skin: Negative.   Allergic/Immunologic: Negative.   Neurological: Negative.   Hematological: Negative.   Psychiatric/Behavioral: Negative.   All other systems reviewed and are negative.  Past Medical History:  Diagnosis Date  . Arthritis   . Cataract    right eye removed   . Complication of anesthesia    "hard to wake up post op" per pt and wife   . GERD (gastroesophageal reflux disease)   . Hyperlipidemia   . Hypertension   . Neuromuscular disorder ([place])    pinched nerve in foot  . Sleep apnea    wears cpap     Social History   Socioeconomic History  . Marital status: Married    Spouse name: Not on file  . Number of children: 2  . Years of education: 62  . Highest education level: High school graduate  Occupational History  . Occupation: retired  Tobacco Use  . Smoking status: Former Smoker    Quit date: 03/01/1967    Years since quitting: 53.6  . Smokeless tobacco: Never Used  . Tobacco comment: Pt states he was a social smoker  Vaping Use  . Vaping Use: Never used  Substance and Sexual Activity  . Alcohol use: Yes    Comment: holidays  . Drug use: No  . Sexual activity: Yes  Other Topics Concern  . Not on file  Social History Narrative  Retired for an Research scientist (physical sciences) for a [employer]   Two children who live in [place]   Married; [facility] 2    Social Determinants of Health   Financial Resource Strain: [place]   . Difficulty of Paying Living Expenses: Not hard at all  Food Insecurity: Not on file  Transportation Needs: No Transportation Needs  . Lack of Transportation (Medical): No  . Lack of Transportation (Non-Medical): No  Physical Activity: Not on file  Stress: Not on file  Social Connections: Not on file  Intimate Partner  Violence: Not on file    Past Surgical History:  Procedure Laterality Date  . CATARACT EXTRACTION Right   . COLONOSCOPY    . JOINT REPLACEMENT     bilateral knee replacenent  . TOTAL KNEE REVISION  05/30/2011   Procedure: TOTAL KNEE REVISION;  Surgeon: Meredith Pel, MD;  Location: [facility];  Service: Orthopedics;  Laterality: Right;  Right total knee arthroplasty polyethylene spacer exchange    Family History  Problem Relation Age of Onset  . Diabetes Sister   . Stroke Sister   . Colon cancer Neg Hx   . Colon polyps Neg Hx   . Esophageal cancer Neg Hx   . Rectal cancer Neg Hx   . Stomach cancer Neg Hx     Allergies  Allergen Reactions  . Lisinopril Cough    Current Outpatient Medications on File Prior to Visit  Medication Sig Dispense Refill  . amLODipine (NORVASC) 10 MG tablet TAKE 1 TABLET BY MOUTH EVERY DAY 90 tablet 0  . aspirin 81 MG tablet Take 81 mg by mouth daily.    . fluticasone (FLONASE) 50 MCG/ACT nasal spray Place 2 sprays into both nostrils daily. 16 g 6  . gabapentin (NEURONTIN) 300 MG capsule Take 1 capsule (300 mg total) by mouth at bedtime. 90 capsule 1  . hydrochlorothiazide (HYDRODIURIL) 25 MG tablet TAKE 1 TABLET BY MOUTH EVERY DAY 90 tablet 1  . meclizine (ANTIVERT) 25 MG tablet Take 1 tablet (25 mg total) by mouth 3 (three) times daily as needed for dizziness. 30 tablet 0  . olmesartan (BENICAR) 20 MG tablet Take 1 tablet (20 mg total) by mouth daily. 90 tablet 3  . omeprazole (PRILOSEC) 40 MG capsule TAKE 1 CAPSULE BY MOUTH EVERY DAY 90 capsule 3  . oseltamivir (TAMIFLU) 75 MG capsule Take 1 capsule (75 mg total) by mouth 2 (two) times daily. 10 capsule 0  . simvastatin (ZOCOR) 10 MG tablet Take 1 tablet (10 mg total) by mouth at bedtime. 90 tablet 3  . losartan (COZAAR) 50 MG tablet Take 1 tablet (50 mg total) by mouth daily. 90 tablet 1   No current facility-administered medications on file prior to visit.    BP 120/70 (BP Location: Left Arm,  Patient Position: Sitting, Cuff Size: Large)   Pulse 78   Temp 97.9 F (36.6 C) (Oral)   Ht 5\' 9"  (1.753 m)   Wt 270 lb 6.4 oz (122.7 kg)   SpO2 97%   BMI 39.93 kg/m       Objective:   Physical Exam Vitals and nursing note reviewed.  Constitutional:      General: He is not in acute distress.    Appearance: Normal appearance. He is well-developed. He is obese.  HENT:     Head: Normocephalic and atraumatic.     Right Ear: Tympanic membrane, ear canal and external ear normal. There is no impacted cerumen.  Left Ear: Tympanic membrane, ear canal and external ear normal. There is no impacted cerumen.     Nose: Nose normal. No congestion or rhinorrhea.     Mouth/Throat:     Mouth: Mucous membranes are moist.     Pharynx: Oropharynx is clear. No oropharyngeal exudate or posterior oropharyngeal erythema.  Eyes:     General:        Right eye: No discharge.        Left eye: No discharge.     Extraocular Movements: Extraocular movements intact.     Conjunctiva/sclera: Conjunctivae normal.     Pupils: Pupils are equal, round, and reactive to light.  Neck:     Vascular: No carotid bruit.     Trachea: No tracheal deviation.  Cardiovascular:     Rate and Rhythm: Normal rate and regular rhythm.     Pulses: Normal pulses.     Heart sounds: Normal heart sounds. No murmur heard. No friction rub. No gallop.   Pulmonary:     Effort: Pulmonary effort is normal. No respiratory distress.     Breath sounds: Normal breath sounds. No stridor. No wheezing, rhonchi or rales.  Chest:     Chest wall: No tenderness.  Abdominal:     General: Bowel sounds are normal. There is no distension.     Palpations: Abdomen is soft. There is no mass.     Tenderness: There is no abdominal tenderness. There is no right CVA tenderness, left CVA tenderness, guarding or rebound.     Hernia: No hernia is present.  Musculoskeletal:        General: No swelling, tenderness, deformity or signs of injury. Normal  range of motion.     Right lower leg: No edema.     Left lower leg: No edema.  Lymphadenopathy:     Cervical: No cervical adenopathy.  Skin:    General: Skin is warm and dry.     Capillary Refill: Capillary refill takes less than 2 seconds.     Coloration: Skin is not jaundiced or pale.     Findings: No bruising, erythema, lesion or rash.  Neurological:     General: No focal deficit present.     Mental Status: He is alert and oriented to person, place, and time.     Cranial Nerves: No cranial nerve deficit.     Sensory: No sensory deficit.     Motor: No weakness.     Coordination: Coordination normal.     Gait: Gait normal.     Deep Tendon Reflexes: Reflexes normal.  Psychiatric:        Mood and Affect: Mood normal.        Behavior: Behavior normal.        Thought Content: Thought content normal.        Judgment: Judgment normal.       Assessment & Plan:  1. Encounter for general adult medical examination with abnormal findings - Continue to work on weight loss through diet and exercise - Follow up in one year or sooner if needed - CBC with Differential/Platelet; Future - Comprehensive metabolic panel; Future - Lipid panel; Future - TSH; Future  2. Essential hypertension - Controlled. No change in medications  - CBC with Differential/Platelet; Future - Comprehensive metabolic panel; Future - Lipid panel; Future - TSH; Future - amLODipine (NORVASC) 10 MG tablet; Take 1 tablet (10 mg total) by mouth daily.  Dispense: 90 tablet; Refill: 3 - losartan (COZAAR) 50 MG tablet; Take  1 tablet (50 mg total) by mouth daily.  Dispense: 90 tablet; Refill: 3  3. Hyperlipidemia, unspecified hyperlipidemia type - Consider increase in statin  - CBC with Differential/Platelet; Future - Comprehensive metabolic panel; Future - Lipid panel; Future - TSH; Future  4. OSA (obstructive sleep apnea) - Continue to use CPAP  - CBC with Differential/Platelet; Future - Comprehensive metabolic  panel; Future - Lipid panel; Future - TSH; Future  5. Prostate cancer screening  - PSA; Future  [name], NP

## 2020-09-23 NOTE — Patient Instructions (Signed)
It was great seeing you today!   We will follow up with you regarding your labs   I will see you back in one year or sooner if needed  Continue to work on weight loss

## 2020-10-03 ENCOUNTER — Other Ambulatory Visit: Payer: Self-pay | Admitting: Adult Health

## 2020-10-12 DIAGNOSIS — G4733 Obstructive sleep apnea (adult) (pediatric): Secondary | ICD-10-CM | POA: Diagnosis not present

## 2020-10-25 ENCOUNTER — Telehealth: Payer: Self-pay | Admitting: Pharmacist

## 2020-10-25 NOTE — Chronic Care Management (AMB) (Signed)
Date- Patient called to remind of appointment with Watt Climes on 10/26/2020  at 10:00 am.  Patient aware of appointment date, time, and type of appointment ( in person). Patient aware to have/bring all medications, supplements, blood pressure and/or blood sugar logs to visit.  The patient called back. He stated that he was not aware of this appointment and did not remember scheduling it. He cancelled his appointment and did not want to reschedule.  Star Rating Drug: Medication Dispensed  Quantity Pharmacy  Losartan 25 mg [facility].12.2022 [facility]    Any gaps in medications fill history? No  Maia Breslow, [place] Pharmacist Assistant [phone]

## 2020-10-26 ENCOUNTER — Ambulatory Visit: Payer: Medicare HMO

## 2020-10-31 ENCOUNTER — Other Ambulatory Visit: Payer: Self-pay | Admitting: Adult Health

## 2020-10-31 DIAGNOSIS — H6983 Other specified disorders of Eustachian tube, bilateral: Secondary | ICD-10-CM

## 2020-10-31 DIAGNOSIS — H8110 Benign paroxysmal vertigo, unspecified ear: Secondary | ICD-10-CM

## 2020-11-11 ENCOUNTER — Ambulatory Visit: Payer: Medicare HMO | Admitting: Sports Medicine

## 2020-11-11 ENCOUNTER — Encounter: Payer: Self-pay | Admitting: Sports Medicine

## 2020-11-11 ENCOUNTER — Other Ambulatory Visit: Payer: Self-pay

## 2020-11-11 DIAGNOSIS — M7742 Metatarsalgia, left foot: Secondary | ICD-10-CM

## 2020-11-11 DIAGNOSIS — G4733 Obstructive sleep apnea (adult) (pediatric): Secondary | ICD-10-CM | POA: Diagnosis not present

## 2020-11-11 DIAGNOSIS — M792 Neuralgia and neuritis, unspecified: Secondary | ICD-10-CM

## 2020-11-11 DIAGNOSIS — B351 Tinea unguium: Secondary | ICD-10-CM | POA: Diagnosis not present

## 2020-11-11 DIAGNOSIS — M19079 Primary osteoarthritis, unspecified ankle and foot: Secondary | ICD-10-CM

## 2020-11-11 DIAGNOSIS — M79675 Pain in left toe(s): Secondary | ICD-10-CM | POA: Diagnosis not present

## 2020-11-11 DIAGNOSIS — M79674 Pain in right toe(s): Secondary | ICD-10-CM | POA: Diagnosis not present

## 2020-11-11 DIAGNOSIS — M7741 Metatarsalgia, right foot: Secondary | ICD-10-CM

## 2020-11-11 DIAGNOSIS — M79672 Pain in left foot: Secondary | ICD-10-CM

## 2020-11-11 DIAGNOSIS — M79671 Pain in right foot: Secondary | ICD-10-CM

## 2020-11-11 MED ORDER — GABAPENTIN 300 MG PO CAPS: 300.0000 mg | ORAL_CAPSULE | Freq: Two times a day (BID) | ORAL | 1 refills | Status: DC

## 2020-11-11 NOTE — Progress Notes (Signed)
  Subjective: [name] is a 75 y.o. male patient who presents to office for follow up evaluation of bilateral foot pain. Patient reports that Gabapentin is helping some, denies any pedal complaints. Requests nail trim.  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 90 tablet 3   aspirin 81 MG tablet Take 81 mg by mouth daily.     fluticasone (FLONASE) 50 MCG/ACT nasal spray SPRAY 2 SPRAYS INTO EACH NOSTRIL EVERY DAY 48 mL 2   hydrochlorothiazide (HYDRODIURIL) 25 MG tablet TAKE 1 TABLET BY MOUTH EVERY DAY 90 tablet 1   losartan (COZAAR) 50 MG tablet Take 1 tablet (50 mg total) by mouth daily. 90 tablet 3   olmesartan (BENICAR) 20 MG tablet Take 1 tablet (20 mg total) by mouth daily. 90 tablet 3   omeprazole (PRILOSEC) 40 MG capsule TAKE 1 CAPSULE BY MOUTH EVERY DAY 90 capsule 3   simvastatin (ZOCOR) 10 MG tablet TAKE 1 TABLET BY MOUTH EVERYDAY AT BEDTIME 90 tablet 3   No current facility-administered medications on file prior to visit.    Allergies  Allergen Reactions   Lisinopril Cough    Objective:  General: Alert and oriented x3 in no acute distress  Dermatology: No open lesions bilateral lower extremities, no webspace macerations, no ecchymosis bilateral, all nails x 10 are long with subungual debris consistent with  onychomycosis.   Vascular: Dorsalis Pedis and Posterior Tibial pedal pulses palpable, Capillary Fill Time 3 seconds,(+) pedal hair growth bilateral, no edema bilateral lower extremities, Temperature gradient within normal limits.  Neurology: Johney Maine sensation intact via light touch bilateral, Protective sensation slightly diminished bilateral likely consistent with early neuritis and neuropathy could be a result of his back or a result of prediabetes.  Musculoskeletal: No reproducible tenderness to palpation noted bilateral however patient does have subjective tingling to all toes.  Pes planus foot type.  Limited first MPJ range of motion bilateral left greater than right.   Assessment and Plan: Problem List Items Addressed This Visit   None Visit Diagnoses     Pain due to onychomycosis of toenails of both feet    -  Primary   Neuritis       Metatarsalgia of both feet       Pain in right foot       Pain in left foot       Arthritis of foot            -Complete examination performed -Discussed treatment options -Refilled gabapentin may increase to BID as needed  -Advised patient to wear good supportive shoes daily for foot type -Encouraged gentle range of motion as tolerated and topical foot cream  -Mechanically debrided nails x10 using a sterile nail nipper -Patient to return to office in 3 months for medication check or sooner if condition worsens.  [name], DPM

## 2020-12-08 ENCOUNTER — Telehealth: Payer: Self-pay | Admitting: Pharmacist

## 2020-12-08 NOTE — Chronic Care Management (AMB) (Signed)
    Chronic Care Management Pharmacy Assistant   Name: [name]  MRN: [id number] DOB: 02/19/1946   Reason for Encounter: Disease State General/Hypertension Assessment Call   Conditions to be addressed/monitored: HTN  Recent office visits:  09-23-2020 [name], NP (PCP) - Patient presented for Annual exam and other concerns. Stopped Meclizine, Stopped Oseltamivir Phosphate and Changed Omeprazole '40mg'$  to once a day.  09-11-2020 [name], MD (Family med) - Tele visit for Flu. Prescribed Oseltamivir Phosphate.  Recent consult visits:  11-11-2020 [name], DPM (Podiatry) - Patient presented for Pain due to onychomycosis of toenails of both feet and other concerns.Changed Gabapentin 300 mg from once to twice daily.  Hospital visits:  None in previous 6 months  Medications: Outpatient Encounter Medications as of 12/08/2020  Medication Sig   amLODipine (NORVASC) 10 MG tablet Take 1 tablet (10 mg total) by mouth daily.   aspirin 81 MG tablet Take 81 mg by mouth daily.   fluticasone (FLONASE) 50 MCG/ACT nasal spray SPRAY 2 SPRAYS INTO EACH NOSTRIL EVERY DAY   gabapentin (NEURONTIN) 300 MG capsule Take 1 capsule (300 mg total) by mouth 2 (two) times daily.   hydrochlorothiazide (HYDRODIURIL) 25 MG tablet TAKE 1 TABLET BY MOUTH EVERY DAY   losartan (COZAAR) 50 MG tablet Take 1 tablet (50 mg total) by mouth daily.   olmesartan (BENICAR) 20 MG tablet Take 1 tablet (20 mg total) by mouth daily.   omeprazole (PRILOSEC) 40 MG capsule TAKE 1 CAPSULE BY MOUTH EVERY DAY   simvastatin (ZOCOR) 10 MG tablet TAKE 1 TABLET BY MOUTH EVERYDAY AT BEDTIME   No facility-administered encounter medications on file as of 12/08/2020.   Reviewed chart prior to disease state call. Spoke with patient regarding BP  Recent Office Vitals: BP Readings from Last 3 Encounters:  09/23/20 120/70  04/28/20 (!) 150/75  04/21/20 (!) 148/77   Pulse Readings from Last 3 Encounters:  09/23/20 78   04/28/20 91  04/21/20 80    Wt Readings from Last 3 Encounters:  09/23/20 270 lb 6.4 oz (122.7 kg)  09/11/20 274 lb (124.3 kg)  04/28/20 274 lb 3.2 oz (124.4 kg)     Kidney Function Lab Results  Component Value Date/Time   CREATININE 1.01 09/23/2020 07:57 AM   CREATININE 0.90 10/17/2019 08:55 AM   GFR 72.73 09/23/2020 07:57 AM   GFRNONAA 86 (L) 05/25/2011 10:00 AM   GFRAA >90 05/25/2011 10:00 AM    BMP Latest Ref Rng & Units 09/23/2020 10/17/2019 01/23/2018  Glucose 70 - 99 mg/dL 130(H) 117(H) 99  BUN 6 - 23 mg/dL '18 16 16  '$ Creatinine 0.40 - 1.50 mg/dL 1.01 0.90 0.86  Sodium 135 - 145 mEq/L 139 139 137  Potassium 3.5 - 5.1 mEq/L 4.0 4.0 3.9  Chloride 96 - 112 mEq/L 102 102 101  CO2 19 - 32 mEq/L '28 29 30  '$ Calcium 8.4 - 10.5 mg/dL 9.6 9.8 9.7    Current antihypertensive regimen:  Amlodipine 10 mg daily HCTZ 25 mg daily Losartan 50 mg 1 tablet daily    Notes:  7-29 3rd unsuccessful attempt to reach patient  Care Gaps:  CCM F/U - Need AWV - no future visit - MSG sent to Ramond Craver CMA to schedule.  Zoster vaccine - Overdue COVID Booster #3 - Overdue  Star Rating Drugs:  Simvastatin '10mg'$  - [facility] Losartan '50mg'$  - Last filled 09-23-2020 [facility] at [facility] Pharmacist Assistant [phone]

## 2020-12-09 ENCOUNTER — Ambulatory Visit (INDEPENDENT_AMBULATORY_CARE_PROVIDER_SITE_OTHER): Payer: Medicare HMO | Admitting: Adult Health

## 2020-12-09 ENCOUNTER — Encounter: Payer: Self-pay | Admitting: Adult Health

## 2020-12-09 ENCOUNTER — Other Ambulatory Visit: Payer: Self-pay

## 2020-12-09 VITALS — BP 122/68 | HR 91 | Temp 98.5°F | Ht 69.0 in | Wt 268.0 lb

## 2020-12-09 DIAGNOSIS — M436 Torticollis: Secondary | ICD-10-CM | POA: Diagnosis not present

## 2020-12-09 MED ORDER — CYCLOBENZAPRINE HCL 10 MG PO TABS: 10.0000 mg | ORAL_TABLET | Freq: Every day | ORAL | 0 refills | Status: DC

## 2020-12-09 NOTE — Progress Notes (Signed)
Subjective:    Patient ID: [name], male    DOB: May 15, 1946, 75 y.o.   MRN: [id number]  HPI  75 year old male who  has a past medical history of Arthritis, Cataract, Complication of anesthesia, GERD (gastroesophageal reflux disease), Hyperlipidemia, Hypertension, Neuromuscular disorder ([place]), and Sleep apnea.  He presents to the office today for an acute issue of stiff neck x 5 days was not able to move his head left or right. He denies trauma or aggravating injury. Symptoms have been improving. Has been using massage and warm baths which seem to help. Denies headaches or blurred vision.   Review of Systems See HPI   Past Medical History:  Diagnosis Date   Arthritis    Cataract    right eye removed    Complication of anesthesia    "hard to wake up post op" per pt and wife    GERD (gastroesophageal reflux disease)    Hyperlipidemia    Hypertension    Neuromuscular disorder ([facility])    pinched nerve in foot   Sleep apnea    wears cpap     Social History   Socioeconomic History   Marital status: Married    Spouse name: Not on file   Number of children: 2   Years of education: 12   Highest education level: High school graduate  Occupational History   Occupation: retired  Tobacco Use   Smoking status: Former    Types: Cigarettes    Quit date: 03/01/1967    Years since quitting: 53.8   Smokeless tobacco: Never   Tobacco comments:    Pt states he was a social smoker  Vaping Use   Vaping Use: Never used  Substance and Sexual Activity   Alcohol use: Yes    Comment: holidays   Drug use: No   Sexual activity: Yes  Other Topics Concern   Not on file  Social History Narrative   Retired for an Research scientist (physical sciences) for a [employer]   Two children who live in [place]   Married; [facility] 2    Social Determinants of Radio broadcast assistant Strain: Low Risk    Difficulty of Paying Living Expenses: Not hard at [employer] Insecurity: Not on file   Transportation Needs: No Transportation Needs   Lack of Transportation (Medical): No   Lack of Transportation (Non-Medical): No  Physical Activity: Not on file  Stress: Not on file  Social Connections: Not on file  Intimate Partner Violence: Not on file    Past Surgical History:  Procedure Laterality Date   CATARACT EXTRACTION Right    COLONOSCOPY     JOINT REPLACEMENT     bilateral knee replacenent   TOTAL KNEE REVISION  05/30/2011   Procedure: TOTAL KNEE REVISION;  Surgeon: Meredith Pel, MD;  Location: [facility];  Service: Orthopedics;  Laterality: Right;  Right total knee arthroplasty polyethylene spacer exchange    Family History  Problem Relation Age of Onset   Diabetes Sister    Stroke Sister    Colon cancer Neg Hx    Colon polyps Neg Hx    Esophageal cancer Neg Hx    Rectal cancer Neg Hx    Stomach cancer Neg Hx     Allergies  Allergen Reactions   Lisinopril Cough    Current Outpatient Medications on File Prior to Visit  Medication Sig Dispense Refill   amLODipine (NORVASC) 10 MG tablet Take 1 tablet (10 mg total)  by mouth daily. 90 tablet 3   aspirin 81 MG tablet Take 81 mg by mouth daily.     fluticasone (FLONASE) 50 MCG/ACT nasal spray SPRAY 2 SPRAYS INTO EACH NOSTRIL EVERY DAY 48 mL 2   gabapentin (NEURONTIN) 300 MG capsule Take 1 capsule (300 mg total) by mouth 2 (two) times daily. 120 capsule 1   hydrochlorothiazide (HYDRODIURIL) 25 MG tablet TAKE 1 TABLET BY MOUTH EVERY DAY 90 tablet 1   losartan (COZAAR) 50 MG tablet Take 1 tablet (50 mg total) by mouth daily. 90 tablet 3   olmesartan (BENICAR) 20 MG tablet Take 1 tablet (20 mg total) by mouth daily. 90 tablet 3   omeprazole (PRILOSEC) 40 MG capsule TAKE 1 CAPSULE BY MOUTH EVERY DAY 90 capsule 3   simvastatin (ZOCOR) 10 MG tablet TAKE 1 TABLET BY MOUTH EVERYDAY AT BEDTIME 90 tablet 3   No current facility-administered medications on file prior to visit.    BP 122/68   Pulse 91   Temp 98.5 F (36.9  C) (Oral)   Ht '5\' 9"'$  (1.753 m)   Wt 268 lb (121.6 kg)   SpO2 98%   BMI 39.58 kg/m       Objective:   Physical Exam Vitals and nursing note reviewed.  Constitutional:      Appearance: Normal appearance.  Musculoskeletal:        General: No swelling or tenderness. Normal range of motion.  Skin:    General: Skin is warm and dry.     Capillary Refill: Capillary refill takes less than 2 seconds.  Neurological:     General: No focal deficit present.     Mental Status: He is alert and oriented to person, place, and time.  Psychiatric:        Mood and Affect: Mood normal.        Behavior: Behavior normal.      Assessment & Plan:   1. Neck stiffness - Improving with conservative measures. Will give short course of muscle relaxer if needed.  - cyclobenzaprine (FLEXERIL) 10 MG tablet; Take 1 tablet (10 mg total) by mouth at bedtime.  Dispense: 10 tablet; Refill: 0   [name], NP

## 2020-12-09 NOTE — Patient Instructions (Signed)
You have a muscle strain. I am glad it is improving. I have sent in a medication called Flexeril you can take as needed. This medication can make you feel sleepy

## 2020-12-12 DIAGNOSIS — G4733 Obstructive sleep apnea (adult) (pediatric): Secondary | ICD-10-CM | POA: Diagnosis not present

## 2021-01-10 DIAGNOSIS — G4733 Obstructive sleep apnea (adult) (pediatric): Secondary | ICD-10-CM | POA: Diagnosis not present

## 2021-01-13 ENCOUNTER — Ambulatory Visit: Payer: Medicare HMO

## 2021-02-17 ENCOUNTER — Ambulatory Visit: Payer: Medicare HMO | Admitting: Sports Medicine

## 2021-02-17 ENCOUNTER — Other Ambulatory Visit: Payer: Self-pay

## 2021-02-17 ENCOUNTER — Encounter: Payer: Self-pay | Admitting: Sports Medicine

## 2021-02-17 DIAGNOSIS — M79675 Pain in left toe(s): Secondary | ICD-10-CM | POA: Diagnosis not present

## 2021-02-17 DIAGNOSIS — M7741 Metatarsalgia, right foot: Secondary | ICD-10-CM

## 2021-02-17 DIAGNOSIS — M79674 Pain in right toe(s): Secondary | ICD-10-CM | POA: Diagnosis not present

## 2021-02-17 DIAGNOSIS — M7742 Metatarsalgia, left foot: Secondary | ICD-10-CM

## 2021-02-17 DIAGNOSIS — B351 Tinea unguium: Secondary | ICD-10-CM | POA: Diagnosis not present

## 2021-02-17 DIAGNOSIS — M792 Neuralgia and neuritis, unspecified: Secondary | ICD-10-CM

## 2021-02-17 NOTE — Progress Notes (Signed)
Subjective: [name] is a 75 y.o. male patient who presents to office for follow up evaluation and nail trim. Patient reports that Gabapentin is helping. No concerns at this time.  FBS not recorded. 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 90 tablet 3   aspirin 81 MG tablet Take 81 mg by mouth daily.     cyclobenzaprine (FLEXERIL) 10 MG tablet Take 1 tablet (10 mg total) by mouth at bedtime. 10 tablet 0   fluticasone (FLONASE) 50 MCG/ACT nasal spray SPRAY 2 SPRAYS INTO EACH NOSTRIL EVERY DAY 48 mL 2   gabapentin (NEURONTIN) 300 MG capsule Take 1 capsule (300 mg total) by mouth 2 (two) times daily. 120 capsule 1   hydrochlorothiazide (HYDRODIURIL) 25 MG tablet TAKE 1 TABLET BY MOUTH EVERY DAY 90 tablet 1   olmesartan (BENICAR) 20 MG tablet Take 1 tablet (20 mg total) by mouth daily. 90 tablet 3   omeprazole (PRILOSEC) 40 MG capsule TAKE 1 CAPSULE BY MOUTH EVERY DAY 90 capsule 3   PFIZER-BIONT COVID-19 VAC-TRIS SUSP injection      simvastatin (ZOCOR) 10 MG tablet TAKE 1 TABLET BY MOUTH EVERYDAY AT BEDTIME 90 tablet 3   losartan (COZAAR) 50 MG tablet Take 1 tablet (50 mg total) by mouth daily. 90 tablet 3   No current facility-administered medications on file prior to visit.    Allergies  Allergen  Reactions   Lisinopril Cough    Objective:  General: Alert and oriented x3 in no acute distress  Dermatology: No open lesions bilateral lower extremities, no webspace macerations, no ecchymosis bilateral, all nails x 10 are long with subungual debris consistent with onychomycosis.   Vascular: Dorsalis Pedis and Posterior Tibial pedal pulses palpable, Capillary Fill Time 3 seconds,(+) pedal hair growth bilateral, no edema bilateral lower extremities, Temperature gradient within normal limits.  Neurology: Johney Maine sensation intact via light touch bilateral, Protective sensation slightly diminished bilateral likely consistent with early neuritis and neuropathy could be a result of his back or a result of prediabetes on Gabapentin.  Musculoskeletal: No reproducible tenderness to palpation noted bilateral however patient does have subjective tingling to all toes.  Pes planus foot type.  Limited first MPJ range of motion bilateral left greater than right.   Assessment and Plan: Problem List Items Addressed This Visit   None Visit Diagnoses     Pain due to onychomycosis of toenails of both feet    -  Primary   Relevant Medications   PFIZER-BIONT COVID-19 VAC-TRIS SUSP injection   Neuritis       Metatarsalgia of both feet            -Complete examination performed -Re-Discussed treatment options -Continue with Gabapentin may increase to BID as needed  -Advised patient to wear good supportive shoes daily for foot type -Encouraged gentle range of motion as tolerated and  topical foot cream and may also add on foot spray as well -Mechanically debrided nails x10 using a sterile nail nipper -Patient to return to office in 3 months for medication check or sooner if condition worsens.  [name], DPM

## 2021-02-21 ENCOUNTER — Encounter: Payer: Self-pay | Admitting: Gastroenterology

## 2021-03-16 ENCOUNTER — Other Ambulatory Visit: Payer: Self-pay

## 2021-03-17 ENCOUNTER — Encounter: Payer: Self-pay | Admitting: Adult Health

## 2021-03-17 ENCOUNTER — Ambulatory Visit (INDEPENDENT_AMBULATORY_CARE_PROVIDER_SITE_OTHER): Payer: Medicare HMO | Admitting: Adult Health

## 2021-03-17 VITALS — BP 120/80 | HR 82 | Temp 98.6°F | Ht 69.0 in | Wt 280.0 lb

## 2021-03-17 DIAGNOSIS — R051 Acute cough: Secondary | ICD-10-CM

## 2021-03-17 MED ORDER — HYDROCODONE BIT-HOMATROP MBR 5-1.5 MG/5ML PO SOLN: 5.0000 mL | Freq: Three times a day (TID) | ORAL | 0 refills | Status: DC | PRN

## 2021-03-17 NOTE — Patient Instructions (Signed)
You have a viral cough   I have sent in some cough syrup called Hycodan- this can make you sleepy   You can also use Mucinex.

## 2021-03-17 NOTE — Progress Notes (Signed)
Subjective:    Patient ID: [name], male    DOB: March 02, 1946, 75 y.o.   MRN: [id number]  HPI 75 year old male who  has a past medical history of Arthritis, Cataract, Complication of anesthesia, GERD (gastroesophageal reflux disease), Hyperlipidemia, Hypertension, Neuromuscular disorder ([place]), and Sleep apnea.  He presents to the office today for an acute complaint of a dry cough. He reports that for the last six days he has developed a cough that seems to be worse at night. Cough is keeping him up at night. Denies fevers, chills, shortness of breath, wheezing, chest congestion, chest pain,  or feeling ill.   Has used cough drops without any improvement    Review of Systems See HPI   Past Medical History:  Diagnosis Date   Arthritis    Cataract    right eye removed    Complication of anesthesia    "hard to wake up post op" per pt and wife    GERD (gastroesophageal reflux disease)    Hyperlipidemia    Hypertension    Neuromuscular disorder ([facility])    pinched nerve in foot   Sleep apnea    wears cpap     Social History   Socioeconomic History   Marital status: Married    Spouse name: Not on file   Number of children: 2   Years of education: 12   Highest education level: High school graduate  Occupational History   Occupation: retired  Tobacco Use   Smoking status: Former    Types: Cigarettes    Quit date: 03/01/1967    Years since quitting: 54.0   Smokeless tobacco: Never   Tobacco comments:    Pt states he was a social smoker  Vaping Use   Vaping Use: Never used  Substance and Sexual Activity   Alcohol use: Yes    Comment: holidays   Drug use: No   Sexual activity: Yes  Other Topics Concern   Not on file  Social History Narrative   Retired for an Research scientist (physical sciences) for a [employer]   Two children who live in [place]   Married; [place] 2    Social Determinants of Health   Financial Resource Strain: Not on file  Food Insecurity: Not on file   Transportation Needs: Not on file  Physical Activity: Not on file  Stress: Not on file  Social Connections: Not on file  Intimate Partner Violence: Not on file    Past Surgical History:  Procedure Laterality Date   CATARACT EXTRACTION Right    COLONOSCOPY     JOINT REPLACEMENT     bilateral knee replacenent   TOTAL KNEE REVISION  05/30/2011   Procedure: TOTAL KNEE REVISION;  Surgeon: Meredith Pel, MD;  Location: [facility];  Service: Orthopedics;  Laterality: Right;  Right total knee arthroplasty polyethylene spacer exchange    Family History  Problem Relation Age of Onset   Diabetes Sister    Stroke Sister    Colon cancer Neg Hx    Colon polyps Neg Hx    Esophageal cancer Neg Hx    Rectal cancer Neg Hx    Stomach cancer Neg Hx     Allergies  Allergen Reactions   Lisinopril Cough    Current Outpatient Medications on File Prior to Visit  Medication Sig Dispense Refill   amLODipine (NORVASC) 10 MG tablet Take 1 tablet (10 mg total) by mouth daily. 90 tablet 3   aspirin 81 MG tablet  Take 81 mg by mouth daily.     cyclobenzaprine (FLEXERIL) 10 MG tablet Take 1 tablet (10 mg total) by mouth at bedtime. 10 tablet 0   fluticasone (FLONASE) 50 MCG/ACT nasal spray SPRAY 2 SPRAYS INTO EACH NOSTRIL EVERY DAY 48 mL 2   gabapentin (NEURONTIN) 300 MG capsule Take 1 capsule (300 mg total) by mouth 2 (two) times daily. 120 capsule 1   hydrochlorothiazide (HYDRODIURIL) 25 MG tablet TAKE 1 TABLET BY MOUTH EVERY DAY 90 tablet 1   olmesartan (BENICAR) 20 MG tablet Take 1 tablet (20 mg total) by mouth daily. 90 tablet 3   omeprazole (PRILOSEC) 40 MG capsule TAKE 1 CAPSULE BY MOUTH EVERY DAY 90 capsule 3   PFIZER-BIONT COVID-19 VAC-TRIS SUSP injection      simvastatin (ZOCOR) 10 MG tablet TAKE 1 TABLET BY MOUTH EVERYDAY AT BEDTIME 90 tablet 3   losartan (COZAAR) 50 MG tablet Take 1 tablet (50 mg total) by mouth daily. 90 tablet 3   No current facility-administered medications on file prior  to visit.    BP 120/80   Pulse 82   Temp 98.6 F (37 C) (Oral)   Ht 5\' 9"  (1.753 m)   Wt 280 lb (127 kg)   SpO2 95%   BMI 41.35 kg/m       Objective:   Physical Exam Vitals and nursing note reviewed.  Constitutional:      Appearance: Normal appearance.  Cardiovascular:     Rate and Rhythm: Normal rate and regular rhythm.     Pulses: Normal pulses.  Pulmonary:     Effort: Pulmonary effort is normal.     Breath sounds: Normal breath sounds.  Musculoskeletal:        General: Normal range of motion.  Skin:    General: Skin is warm and dry.     Capillary Refill: Capillary refill takes less than 2 seconds.  Neurological:     General: No focal deficit present.     Mental Status: He is alert and oriented to person, place, and time.  Psychiatric:        Mood and Affect: Mood normal.        Behavior: Behavior normal.        Thought Content: Thought content normal.        Judgment: Judgment normal.      Assessment & Plan:  1. Acute cough - Viral. Will send in Hycodan. Advised this will make him sleepy.  - Can use mucinex  - HYDROcodone bit-homatropine (HYCODAN) 5-1.5 MG/5ML syrup; Take 5 mLs by mouth every 8 (eight) hours as needed for cough.  Dispense: 120 mL; Refill: 0  [name], NP

## 2021-04-01 ENCOUNTER — Other Ambulatory Visit: Payer: Self-pay | Admitting: Adult Health

## 2021-04-01 DIAGNOSIS — Z760 Encounter for issue of repeat prescription: Secondary | ICD-10-CM

## 2021-04-01 DIAGNOSIS — I10 Essential (primary) hypertension: Secondary | ICD-10-CM

## 2021-04-11 DIAGNOSIS — G4733 Obstructive sleep apnea (adult) (pediatric): Secondary | ICD-10-CM | POA: Diagnosis not present

## 2021-05-11 DIAGNOSIS — G4733 Obstructive sleep apnea (adult) (pediatric): Secondary | ICD-10-CM | POA: Diagnosis not present

## 2021-05-23 ENCOUNTER — Other Ambulatory Visit: Payer: Self-pay

## 2021-05-23 ENCOUNTER — Ambulatory Visit (AMBULATORY_SURGERY_CENTER): Payer: Medicare HMO | Admitting: *Deleted

## 2021-05-23 VITALS — Ht 69.0 in | Wt 273.0 lb

## 2021-05-23 DIAGNOSIS — Z86010 Personal history of colonic polyps: Secondary | ICD-10-CM

## 2021-05-23 MED ORDER — PEG 3350-KCL-NA BICARB-NACL 420 G PO SOLR: 4000.0000 mL | Freq: Once | ORAL | 0 refills | Status: AC

## 2021-05-23 NOTE — Progress Notes (Signed)
Patient's pre-visit was done today over the phone with the patient. Name,DOB and address verified. Patient denies any allergies to Eggs and Soy. Patient denies any problems with anesthesia/sedation. Patient is not taking any diet pills or blood thinners. No home Oxygen. Packet of Prep instructions mailed to patient including a copy of a consent form-pt is aware. Prep instructions sent to pt's MyChart (if activated).Patient understands to call us back with any questions or concerns. Patient is aware of our care-partner policy and [id number] safety protocol.     The patient is COVID-19 vaccinated.

## 2021-05-24 ENCOUNTER — Encounter: Payer: Self-pay | Admitting: Gastroenterology

## 2021-05-26 ENCOUNTER — Encounter: Payer: Self-pay | Admitting: Sports Medicine

## 2021-05-26 ENCOUNTER — Ambulatory Visit: Payer: Medicare HMO | Admitting: Sports Medicine

## 2021-05-26 ENCOUNTER — Other Ambulatory Visit: Payer: Self-pay

## 2021-05-26 DIAGNOSIS — M19079 Primary osteoarthritis, unspecified ankle and foot: Secondary | ICD-10-CM

## 2021-05-26 DIAGNOSIS — B351 Tinea unguium: Secondary | ICD-10-CM

## 2021-05-26 DIAGNOSIS — M79674 Pain in right toe(s): Secondary | ICD-10-CM

## 2021-05-26 DIAGNOSIS — M79675 Pain in left toe(s): Secondary | ICD-10-CM | POA: Diagnosis not present

## 2021-05-26 DIAGNOSIS — M7741 Metatarsalgia, right foot: Secondary | ICD-10-CM

## 2021-05-26 DIAGNOSIS — M779 Enthesopathy, unspecified: Secondary | ICD-10-CM | POA: Diagnosis not present

## 2021-05-26 DIAGNOSIS — M792 Neuralgia and neuritis, unspecified: Secondary | ICD-10-CM

## 2021-05-26 DIAGNOSIS — M7742 Metatarsalgia, left foot: Secondary | ICD-10-CM

## 2021-05-26 MED ORDER — DICLOFENAC SODIUM 1 % EX GEL: 4.0000 g | Freq: Four times a day (QID) | CUTANEOUS | 2 refills | Status: DC

## 2021-05-26 NOTE — Progress Notes (Signed)
Subjective: [name] is a 76 y.o. male patient who presents to office for follow up evaluation and nail trim. Patient reports that Gabapentin did NOT helping anymore.  Still has pain.  No concerns at this time.  FBS not recorded. 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 90 tablet 3   aspirin 81 MG tablet Take 81 mg by mouth daily.     cyclobenzaprine (FLEXERIL) 10 MG tablet Take 1 tablet (10 mg total) by mouth at bedtime. 10 tablet 0   gabapentin (NEURONTIN) 300 MG capsule Take 1 capsule (300 mg total) by mouth 2 (two) times daily. 120 capsule 1   hydrochlorothiazide (HYDRODIURIL) 25 MG tablet TAKE 1 TABLET BY MOUTH EVERY DAY 90 tablet 1   HYDROcodone bit-homatropine (HYCODAN) 5-1.5 MG/5ML syrup Take 5 mLs by mouth every 8 (eight) hours as needed for cough. 120 mL 0   losartan (COZAAR) 50 MG tablet Take 1 tablet (50 mg total) by mouth daily. 90 tablet 3   olmesartan (BENICAR) 20 MG tablet Take 1 tablet (20 mg total) by mouth daily. 90 tablet 3   omeprazole (PRILOSEC) 40 MG capsule TAKE 1 CAPSULE BY MOUTH EVERY DAY 90 capsule 3   polyethylene glycol-electrolytes (NULYTELY) 420 g solution Take by mouth.     simvastatin (ZOCOR) 10 MG tablet TAKE 1 TABLET BY MOUTH EVERYDAY AT BEDTIME  90 tablet 3   No current facility-administered medications on file prior to visit.    Allergies  Allergen Reactions   Lisinopril Cough    Objective:  General: Alert and oriented x3 in no acute distress  Dermatology: No open lesions bilateral lower extremities, no webspace macerations, no ecchymosis bilateral, all nails x 10 are long with subungual debris consistent with onychomycosis.   Vascular: Dorsalis Pedis and Posterior Tibial pedal pulses palpable, Capillary Fill Time 3 seconds,(+) pedal hair growth bilateral, no edema bilateral lower extremities, Temperature gradient within normal limits.  Neurology: Johney Maine sensation intact via light touch bilateral, Protective sensation slightly diminished bilateral likely consistent with early neuritis and neuropathy could be a result of his back or a result of prediabetes on Gabapentin.  Musculoskeletal: No reproducible tenderness to palpation noted bilateral however patient does have subjective tingling to all toes and today stiffness or pain with ROM of 1st MTPJs R>L.  Pes planus foot type.    Assessment and Plan: Problem List Items Addressed This Visit   None Visit Diagnoses     Capsulitis    -  Primary   Relevant Medications   diclofenac Sodium (VOLTAREN) 1 % GEL   Arthritis of foot       Relevant Medications   diclofenac Sodium (VOLTAREN) 1 % GEL   Metatarsalgia of both feet       Neuritis       Pain due to onychomycosis of toenails of both  feet            -Complete examination performed -Re-Discussed treatment options -Patient has completed Gabapentin wit no relief since today he is complaining of pain at joints recommend voltaren, patient wants to try voltaren gel as Rx'd and advised patient if this fails to work may benefit from a arthritic panel and further work-up of joint pain -Advised patient to wear good supportive shoes daily for foot type -Mechanically debrided nails x10 using a sterile nail nipper -Patient to  return to office in 3 months for medication check or sooner if condition worsens.  [name], DPM

## 2021-05-30 ENCOUNTER — Ambulatory Visit (AMBULATORY_SURGERY_CENTER): Payer: Medicare HMO | Admitting: Gastroenterology

## 2021-05-30 ENCOUNTER — Encounter: Payer: Self-pay | Admitting: Gastroenterology

## 2021-05-30 ENCOUNTER — Other Ambulatory Visit: Payer: Self-pay

## 2021-05-30 VITALS — BP 120/56 | HR 63 | Temp 98.6°F | Resp 16 | Ht 69.0 in | Wt 273.0 lb

## 2021-05-30 DIAGNOSIS — Z86010 Personal history of colonic polyps: Secondary | ICD-10-CM | POA: Diagnosis not present

## 2021-05-30 DIAGNOSIS — D122 Benign neoplasm of ascending colon: Secondary | ICD-10-CM | POA: Diagnosis not present

## 2021-05-30 DIAGNOSIS — D124 Benign neoplasm of descending colon: Secondary | ICD-10-CM

## 2021-05-30 MED ORDER — SODIUM CHLORIDE 0.9 % IV SOLN: 500.0000 mL | Freq: Once | INTRAVENOUS | Status: DC

## 2021-05-30 NOTE — Progress Notes (Signed)
HPI: This is a man with h/o polyps  Colonoscopy 03/2018 three adenomatous polyps, one was 36mm   ROS: complete GI ROS as described in HPI, all other review negative.  Constitutional:  No unintentional weight loss   Past Medical History:  Diagnosis Date   Arthritis    Cataract    right eye removed    Complication of anesthesia    "hard to wake up post op" per pt and wife    GERD (gastroesophageal reflux disease)    Hyperlipidemia    Hypertension    Neuromuscular disorder ([place])    pinched nerve in foot   Sleep apnea    wears cpap     Past Surgical History:  Procedure Laterality Date   CATARACT EXTRACTION Right    COLONOSCOPY     JOINT REPLACEMENT     bilateral knee replacenent   TOTAL KNEE REVISION  05/30/2011   Procedure: TOTAL KNEE REVISION;  Surgeon: Meredith Pel, MD;  Location: [facility];  Service: Orthopedics;  Laterality: Right;  Right total knee arthroplasty polyethylene spacer exchange    Current Outpatient Medications  Medication Sig Dispense Refill   amLODipine (NORVASC) 10 MG tablet Take 1 tablet (10 mg total) by mouth daily. 90 tablet 3   aspirin 81 MG tablet Take 81 mg by mouth daily.     cyclobenzaprine (FLEXERIL) 10 MG tablet Take 1 tablet (10 mg total) by mouth at bedtime. 10 tablet 0   diclofenac Sodium (VOLTAREN) 1 % GEL Apply 4 g topically 4 (four) times daily. 150 g 2   gabapentin (NEURONTIN) 300 MG capsule Take 1 capsule (300 mg total) by mouth 2 (two) times daily. 120 capsule 1   hydrochlorothiazide (HYDRODIURIL) 25 MG tablet TAKE 1 TABLET BY MOUTH EVERY DAY 90 tablet 1   HYDROcodone bit-homatropine (HYCODAN) 5-1.5 MG/5ML syrup Take 5 mLs by mouth every 8 (eight) hours as needed for cough. 120 mL 0   losartan (COZAAR) 50 MG tablet Take 1 tablet (50 mg total) by mouth daily. 90 tablet 3   olmesartan (BENICAR) 20 MG tablet Take 1 tablet (20 mg total) by mouth daily. 90 tablet 3   omeprazole (PRILOSEC) 40 MG capsule TAKE 1 CAPSULE BY MOUTH EVERY DAY  90 capsule 3   polyethylene glycol-electrolytes (NULYTELY) 420 g solution Take by mouth.     simvastatin (ZOCOR) 10 MG tablet TAKE 1 TABLET BY MOUTH EVERYDAY AT BEDTIME 90 tablet 3   Current Facility-Administered Medications  Medication Dose Route Frequency Provider Last Rate Last Admin   0.9 %  sodium chloride infusion  500 mL Intravenous Once Milus Banister, MD        Allergies as of 05/30/2021 - Review Complete 05/30/2021  Allergen Reaction Noted   Lisinopril Cough 01/11/2016    Family History  Problem Relation Age of Onset   Diabetes Sister    Stroke Sister    Colon cancer Neg Hx    Colon polyps Neg Hx    Esophageal cancer Neg Hx    Rectal cancer Neg Hx    Stomach cancer Neg Hx     Social History   Socioeconomic History   Marital status: Married    Spouse name: Not on file   Number of children: 2   Years of education: 12   Highest education level: High school graduate  Occupational History   Occupation: retired  Tobacco Use   Smoking status: Former    Types: Cigarettes    Quit date: 03/01/1967  Years since quitting: 54.2   Smokeless tobacco: Never   Tobacco comments:    Pt states he was a social smoker  Vaping Use   Vaping Use: Never used  Substance and Sexual Activity   Alcohol use: Yes    Comment: holidays   Drug use: No   Sexual activity: Yes  Other Topics Concern   Not on file  Social History Narrative   Retired for an Research scientist (physical sciences) for a [employer]   Two children who live in [place]   Married; [place] 2    Social Determinants of Radio broadcast assistant Strain: Not on Art therapist Insecurity: Not on file  Transportation Needs: Not on file  Physical Activity: Not on file  Stress: Not on file  Social Connections: Not on file  Intimate Partner Violence: Not on file     Physical Exam: BP (!) 146/76    Pulse 70    Temp 98.6 F (37 C) (Temporal)    Ht 5\' 9"  (1.753 m)    Wt 273 lb (123.8 kg)    SpO2 99%    BMI 40.32 kg/m   Constitutional: generally well-appearing Psychiatric: alert and oriented x3 Lungs: CTA bilaterally Heart: no MCR  Assessment and plan: 76 y.o. male with h/o polyps  Colonoscopy today  Care is appropriate for the ambulatory setting.  Owens Loffler, MD [facility] 05/30/2021, 7:39 AM

## 2021-05-30 NOTE — Progress Notes (Signed)
PT taken to PACU. Monitors in place. VSS. Report given to RN. 

## 2021-05-30 NOTE — Progress Notes (Signed)
Called to room to assist during endoscopic procedure.  Patient ID and intended procedure confirmed with present staff. Received instructions for my participation in the procedure from the performing physician.  

## 2021-05-30 NOTE — Op Note (Signed)
[facility] Patient Name: Cassie Henkels Procedure Date: 05/30/2021 8:20 AM MRN: [id number] Endoscopist: Milus Banister , MD Age: 76 Referring MD:  Date of Birth: 1945/09/18 Gender: Male Account #: [id number] Procedure:                Colonoscopy Indications:              High risk colon cancer surveillance: Personal                            history of colonic polyps; Colonoscopy 03/2018                            three adenomatous polyps, one was 48mm Medicines:                Monitored Anesthesia Care Procedure:                Pre-Anesthesia Assessment:                           - Prior to the procedure, a History and Physical                            was performed, and patient medications and                            allergies were reviewed. The patient's tolerance of                            previous anesthesia was also reviewed. The risks                            and benefits of the procedure and the sedation                            options and risks were discussed with the patient.                            All questions were answered, and informed consent                            was obtained. Prior Anticoagulants: The patient has                            taken no previous anticoagulant or antiplatelet                            agents. ASA Grade Assessment: II - A patient with                            mild systemic disease. After reviewing the risks                            and benefits, the patient was deemed in  satisfactory condition to undergo the procedure.                           After obtaining informed consent, the colonoscope                            was passed under direct vision. Throughout the                            procedure, the patient's blood pressure, pulse, and                            oxygen saturations were monitored continuously. The                            Olympus Colonoscope #[id number]  was introduced through                            the anus and advanced to the the cecum, identified                            by appendiceal orifice and ileocecal valve. The                            colonoscopy was performed without difficulty. The                            patient tolerated the procedure well. The quality                            of the bowel preparation was good. The ileocecal                            valve, appendiceal orifice, and rectum were                            photographed. Scope In: 8:25:07 AM Scope Out: 8:37:08 AM Scope Withdrawal Time: 0 hours 9 minutes 0 seconds  Total Procedure Duration: 0 hours 12 minutes 1 second  Findings:                 Four sessile polyps were found in the descending                            colon and ascending colon. The polyps were 3 to 7                            mm in size. These polyps were removed with a cold                            snare. Resection and retrieval were complete.                           The exam was otherwise without abnormality on  direct and retroflexion views. Complications:            No immediate complications. Estimated blood loss:                            None. Estimated Blood Loss:     Estimated blood loss: none. Impression:               - Four 3 to 7 mm polyps in the descending colon and                            in the ascending colon, removed with a cold snare.                            Resected and retrieved.                           - The examination was otherwise normal on direct                            and retroflexion views. Recommendation:           - Patient has a contact number available for                            emergencies. The signs and symptoms of potential                            delayed complications were discussed with the                            patient. Return to normal activities tomorrow.                             Written discharge instructions were provided to the                            patient.                           - Resume previous diet.                           - Continue present medications.                           - Await pathology results. Milus Banister, MD 05/30/2021 8:39:20 AM This report has been signed electronically.

## 2021-05-30 NOTE — Patient Instructions (Signed)
Handout on polyps given to you today  Await pathology results from Dr. Ardis Hughs    YOU HAD AN ENDOSCOPIC PROCEDURE TODAY AT THE [facility]:   Refer to the procedure report that was given to you for any specific questions about what was found during the examination.  If the procedure report does not answer your questions, please call your gastroenterologist to clarify.  If you requested that your care partner not be given the details of your procedure findings, then the procedure report has been included in a sealed envelope for you to review at your convenience later.  YOU SHOULD EXPECT: Some feelings of bloating in the abdomen. Passage of more gas than usual.  Walking can help get rid of the air that was put into your GI tract during the procedure and reduce the bloating. If you had a lower endoscopy (such as a colonoscopy or flexible sigmoidoscopy) you may notice spotting of blood in your stool or on the toilet paper. If you underwent a bowel prep for your procedure, you may not have a normal bowel movement for a few days.  Please Note:  You might notice some irritation and congestion in your nose or some drainage.  This is from the oxygen used during your procedure.  There is no need for concern and it should clear up in a day or so.  SYMPTOMS TO REPORT IMMEDIATELY:  Following lower endoscopy (colonoscopy or flexible sigmoidoscopy):  Excessive amounts of blood in the stool  Significant tenderness or worsening of abdominal pains  Swelling of the abdomen that is new, acute  Fever of 100F or higher  For urgent or emergent issues, a gastroenterologist can be reached at any hour by calling [phone]. Do not use MyChart messaging for urgent concerns.    DIET:  We do recommend a small meal at first, but then you may proceed to your regular diet.  Drink plenty of fluids but you should avoid alcoholic beverages for 24 hours.  ACTIVITY:  You should plan to take it easy for the rest  of today and you should NOT DRIVE or use heavy machinery until tomorrow (because of the sedation medicines used during the test).    FOLLOW UP: Our staff will call the number listed on your records 48-72 hours following your procedure to check on you and address any questions or concerns that you may have regarding the information given to you following your procedure. If we do not reach you, we will leave a message.  We will attempt to reach you two times.  During this call, we will ask if you have developed any symptoms of COVID 19. If you develop any symptoms (ie: fever, flu-like symptoms, shortness of breath, cough etc.) before then, please call [phone].  If you test positive for Covid 19 in the 2 weeks post procedure, please call and report this information to [place].    If any biopsies were taken you will be contacted by phone or by letter within the next 1-3 weeks.  Please call us at [phone] if you have not heard about the biopsies in 3 weeks.    SIGNATURES/CONFIDENTIALITY: You and/or your care partner have signed paperwork which will be entered into your electronic medical record.  These signatures attest to the fact that that the information above on your After Visit Summary has been reviewed and is understood.  Full responsibility of the confidentiality of this discharge information lies with you and/or your care-partner.

## 2021-06-01 ENCOUNTER — Telehealth: Payer: Self-pay | Admitting: *Deleted

## 2021-06-01 NOTE — Telephone Encounter (Signed)
Second follow up call attempt.  LVM ?

## 2021-06-01 NOTE — Telephone Encounter (Signed)
°  Follow up Call-  Call back number 05/30/2021  Post procedure Call Back phone  # [phone]  Permission to leave phone message Yes  Some recent data might be hidden   [facility]

## 2021-06-03 ENCOUNTER — Encounter: Payer: Self-pay | Admitting: Gastroenterology

## 2021-06-11 DIAGNOSIS — G4733 Obstructive sleep apnea (adult) (pediatric): Secondary | ICD-10-CM | POA: Diagnosis not present

## 2021-06-27 ENCOUNTER — Other Ambulatory Visit: Payer: Self-pay | Admitting: Adult Health

## 2021-06-27 DIAGNOSIS — I10 Essential (primary) hypertension: Secondary | ICD-10-CM

## 2021-07-12 DIAGNOSIS — G4733 Obstructive sleep apnea (adult) (pediatric): Secondary | ICD-10-CM | POA: Diagnosis not present

## 2021-08-01 ENCOUNTER — Telehealth: Payer: Self-pay | Admitting: Pharmacist

## 2021-08-01 NOTE — Chronic Care Management (AMB) (Signed)
? ? ?Chronic Care Management ?Pharmacy Assistant  ? ?Name: [name]  MRN: [id number] DOB: Jun 25, 1945 ? ?Reason for Encounter: Disease State General Assessment ?  ?Conditions to be addressed/monitored: ?HTN ? ?Recent office visits:  ?03/17/21 Kevin Peng, NP - Patient presented for Acute cough. Prescribed Hydrocodone ? ?Recent consult visits:  ?05/30/21 Patient presented to [facility] for Colonoscopy  ? ?05/26/21 [name], DPM (Podiatry) - Patient presented for Capsulitis and other concerns. Prescribed Diclofenac Sodium. ? ?05/23/21 Kevin Spiller, RN (Endo) - Pateint presented for Personal history of colonic polyps. Prescribed PEG 3350, Stopped Fluticasone. ? ?02/17/21 [name], DPM (Podiatry) - Patient presented for Pain due to onychomycosis of toenails of both feet and other concerns. No medication changes. ? ?Hospital visits:  ?None in previous 6 months ? ?Medications: ?Outpatient Encounter Medications as of 08/01/2021  ?Medication Sig  ? amLODipine (NORVASC) 10 MG tablet TAKE 1 TABLET BY MOUTH EVERY DAY  ? aspirin 81 MG tablet Take 81 mg by mouth daily.  ? cyclobenzaprine (FLEXERIL) 10 MG tablet Take 1 tablet (10 mg total) by mouth at bedtime.  ? diclofenac Sodium (VOLTAREN) 1 % GEL Apply 4 g topically 4 (four) times daily.  ? gabapentin (NEURONTIN) 300 MG capsule Take 1 capsule (300 mg total) by mouth 2 (two) times daily.  ? hydrochlorothiazide (HYDRODIURIL) 25 MG tablet TAKE 1 TABLET BY MOUTH EVERY DAY  ? HYDROcodone bit-homatropine (HYCODAN) 5-1.5 MG/5ML syrup Take 5 mLs by mouth every 8 (eight) hours as needed for cough.  ? losartan (COZAAR) 50 MG tablet TAKE 1 TABLET BY MOUTH EVERY DAY  ? olmesartan (BENICAR) 20 MG tablet Take 1 tablet (20 mg total) by mouth daily.  ? omeprazole (PRILOSEC) 40 MG capsule TAKE 1 CAPSULE BY MOUTH EVERY DAY  ? simvastatin (ZOCOR) 10 MG tablet TAKE 1 TABLET BY MOUTH EVERYDAY AT BEDTIME  ? ?No facility-administered encounter medications on file as of  08/01/2021.  ?Reviewed chart prior to disease state call. Spoke with patient regarding BP ? ?Recent Office Vitals: ?BP Readings from Last 3 Encounters:  ?05/30/21 (!) 120/56  ?03/17/21 120/80  ?12/09/20 122/68  ? ?Pulse Readings from Last 3 Encounters:  ?05/30/21 63  ?03/17/21 82  ?12/09/20 91  ?  ?Wt Readings from Last 3 Encounters:  ?05/30/21 273 lb (123.8 kg)  ?05/23/21 273 lb (123.8 kg)  ?03/17/21 280 lb (127 kg)  ?  ? ?Kidney Function ?Lab Results  ?Component Value Date/Time  ? CREATININE 1.01 09/23/2020 07:57 AM  ? CREATININE 0.90 10/17/2019 08:55 AM  ? GFR 72.73 09/23/2020 07:57 AM  ? GFRNONAA 86 (L) 05/25/2011 10:00 AM  ? GFRAA >90 05/25/2011 10:00 AM  ? ? ?BMP Latest Ref Rng & Units 09/23/2020 10/17/2019 01/23/2018  ?Glucose 70 - 99 mg/dL 130(H) 117(H) 99  ?BUN 6 - 23 mg/dL '18 16 16  '$ ?Creatinine 0.40 - 1.50 mg/dL 1.01 0.90 0.86  ?Sodium 135 - 145 mEq/L 139 139 137  ?Potassium 3.5 - 5.1 mEq/L 4.0 4.0 3.9  ?Chloride 96 - 112 mEq/L 102 102 101  ?CO2 19 - 32 mEq/L '28 29 30  '$ ?Calcium 8.4 - 10.5 mg/dL 9.6 9.8 9.7  ? ? ?Current antihypertensive regimen:  ?Amlodipine 10 mg daily ?HCTZ 25 mg daily ?Losartan 50 mg 1 tablet daily ?How often are you checking your Blood Pressure? weekly ?Current home BP readings: Patient reports reading of 139/85 Denies any hypotensive symptoms. ?What recent interventions/DTPs have been made by any provider to improve Blood Pressure control since last CPP  Visit: Patient reports none. ?Any recent hospitalizations or ED visits since last visit with CPP? No ? ?Adherence Review: ?Is the patient currently on ACE/ARB medication? No ?Does the patient have >5 day gap between last estimated fill dates? No ? ? ? ?Care Gaps: ?Zoster Vaccine - Overdue ?COVID Booster - Overdue ?BP- 139/85 (Home 08/01/21) ?AWV- Office aware to schedule as of 7/22 ?CCM- 9/23 ? ?Star Rating Drugs: ?Simvastatin '10mg'$  - Last [facility]/12/21 [facility] ?Losartan '50mg'$  - Last filled 06/13/21 90[facility] at [facility] ? ? ?Ned Clines  CMA ?Clinical Pharmacist Assistant ?[phone] ? ?

## 2021-08-09 DIAGNOSIS — G4733 Obstructive sleep apnea (adult) (pediatric): Secondary | ICD-10-CM | POA: Diagnosis not present

## 2021-08-11 ENCOUNTER — Telehealth: Payer: Self-pay

## 2021-08-11 NOTE — Telephone Encounter (Signed)
Last CPE 09/23/20.  ?LVM instructions for pt to call back to schedule cpe; needs to be after 09/23/21. ?

## 2021-08-25 ENCOUNTER — Encounter: Payer: Self-pay | Admitting: Sports Medicine

## 2021-08-25 ENCOUNTER — Ambulatory Visit: Payer: Medicare HMO | Admitting: Sports Medicine

## 2021-08-25 DIAGNOSIS — B351 Tinea unguium: Secondary | ICD-10-CM

## 2021-08-25 DIAGNOSIS — M792 Neuralgia and neuritis, unspecified: Secondary | ICD-10-CM

## 2021-08-25 DIAGNOSIS — M19079 Primary osteoarthritis, unspecified ankle and foot: Secondary | ICD-10-CM

## 2021-08-25 DIAGNOSIS — M79675 Pain in left toe(s): Secondary | ICD-10-CM | POA: Diagnosis not present

## 2021-08-25 DIAGNOSIS — M79674 Pain in right toe(s): Secondary | ICD-10-CM

## 2021-08-25 NOTE — Progress Notes (Signed)
?Subjective: ?DERIUS GHOSH is a 76 y.o. male patient who presents to office for follow up evaluation and nail trim. Patient reports that Gabapentin did NOT help and topical Voltaren cream did not help.  Still has pain.  No concerns at this time. ? ?FBS not recorded. Still considered Prediabetic as previously noted ? ?Patient Active Problem List  ? Diagnosis Date Noted  ? Influenza 09/11/2020  ? OSA (obstructive sleep apnea) 04/16/2017  ? 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 IMPAIRED GLUCOSE TOLERANCE 01/18/2010  ? [place] DISEASE, CERVICAL 10/22/2008  ? EXERCISE INDUCED BRONCHOSPASM 07/15/2007  ? ERECTILE DYSFUNCTION, ORGANIC 05/17/2007  ? DYSHIDROSIS 05/17/2007  ? DEGENERATIVE JOINT DISEASE, BOTH KNEES, SEVERE 05/17/2007  ? Essential hypertension 12/21/2006  ? Disorder resulting from impaired renal function 12/21/2006  ? ? ?Current Outpatient Medications on File Prior to Visit  ?Medication Sig Dispense Refill  ? amLODipine (NORVASC) 10 MG tablet TAKE 1 TABLET BY MOUTH EVERY DAY 90 tablet 0  ? aspirin 81 MG tablet Take 81 mg by mouth daily.    ? cyclobenzaprine (FLEXERIL) 10 MG tablet Take 1 tablet (10 mg total) by mouth at bedtime. 10 tablet 0  ? diclofenac Sodium (VOLTAREN) 1 % GEL Apply 4 g topically 4 (four) times daily. 150 g 2  ? gabapentin (NEURONTIN) 300 MG capsule Take 1 capsule (300 mg total) by mouth 2 (two) times daily. 120 capsule 1  ? hydrochlorothiazide (HYDRODIURIL) 25 MG tablet TAKE 1 TABLET BY MOUTH EVERY DAY 90 tablet 1  ? HYDROcodone bit-homatropine (HYCODAN) 5-1.5 MG/5ML syrup Take 5 mLs by mouth every 8 (eight) hours as needed for cough. 120 mL 0  ? losartan (COZAAR) 50 MG tablet TAKE 1 TABLET BY MOUTH EVERY DAY 90 tablet 0  ? olmesartan (BENICAR) 20 MG tablet Take 1 tablet (20 mg total) by mouth daily. 90 tablet 3  ? omeprazole (PRILOSEC) 40 MG capsule TAKE 1 CAPSULE BY MOUTH EVERY DAY 90 capsule 0  ? simvastatin (ZOCOR) 10 MG tablet TAKE 1 TABLET BY MOUTH  EVERYDAY AT BEDTIME 90 tablet 3  ? ?No current facility-administered medications on file prior to visit.  ? ? ?Allergies  ?Allergen Reactions  ? Lisinopril Cough  ? ? ?Objective:  ?General: Alert and oriented x3 in no acute distress ? ?Dermatology: No open lesions bilateral lower extremities, no webspace macerations, no ecchymosis bilateral, all nails x 10 are long with subungual debris consistent with onychomycosis.  ? ?Vascular: Dorsalis Pedis and Posterior Tibial pedal pulses palpable, Capillary Fill Time 3 seconds,(+) pedal hair growth bilateral, no edema bilateral lower extremities, Temperature gradient within normal limits. ? ?Neurology: Gross sensation intact via light touch bilateral, Protective sensation slightly diminished bilateral likely consistent with early neuritis and neuropathy could be a result of his back or a result of prediabetes failed gabapentin in the past. ? ?Musculoskeletal: No reproducible tenderness to palpation noted bilateral however patient does have subjective tingling to all toes and today stiffness or pain with ROM of 1st MTPJs R>L that is unchanged from prior.  Pes planus foot type.  ? ? ?Assessment and Plan: ?Problem List Items Addressed This Visit   ?None ?Visit Diagnoses   ? ? Pain due to onychomycosis of toenails of both feet    -  Primary  ? Neuritis      ? Relevant Orders  ? Ambulatory referral to Neurology  ? Arthritis of foot      ? ?  ?  ? ?-Complete examination performed ?-  Re-Discussed treatment options for continued pain ?-Referral placed to neurology since patient has failed gabapentin in the past and has not made improvement with topical treatment advised daughter that if neurologist pain and thinks this is more arthritis may need additional follow-up with rheumatologist in the future  ?-Meanwhile may try topical capsaicin or nervive cream for pain ?-Advised patient to wear good supportive shoes daily for foot type ?-Discussed continued care for thickened elongated  nails ?-Mechanically debrided all painful nails x10 using a sterile nail nipper without complication ?-Patient to return to office in 3 months for nail trim and follow-up neurology recommendations or sooner if condition worsens. ? ?[name], DPM ? ?

## 2021-08-26 ENCOUNTER — Encounter: Payer: Self-pay | Admitting: Neurology

## 2021-09-09 ENCOUNTER — Emergency Department (HOSPITAL_BASED_OUTPATIENT_CLINIC_OR_DEPARTMENT_OTHER): Payer: Medicare HMO | Admitting: Radiology

## 2021-09-09 ENCOUNTER — Encounter: Payer: Self-pay | Admitting: Adult Health

## 2021-09-09 ENCOUNTER — Emergency Department (HOSPITAL_BASED_OUTPATIENT_CLINIC_OR_DEPARTMENT_OTHER)
Admission: EM | Admit: 2021-09-09 | Discharge: 2021-09-09 | Disposition: A | Discharge status: Home / Self Care | Payer: Medicare HMO | Attending: Emergency Medicine | Admitting: Emergency Medicine

## 2021-09-09 ENCOUNTER — Ambulatory Visit (INDEPENDENT_AMBULATORY_CARE_PROVIDER_SITE_OTHER): Payer: Medicare HMO | Admitting: Adult Health

## 2021-09-09 ENCOUNTER — Other Ambulatory Visit: Payer: Self-pay

## 2021-09-09 ENCOUNTER — Encounter (HOSPITAL_BASED_OUTPATIENT_CLINIC_OR_DEPARTMENT_OTHER): Payer: Self-pay | Admitting: Obstetrics and Gynecology

## 2021-09-09 ENCOUNTER — Emergency Department (HOSPITAL_BASED_OUTPATIENT_CLINIC_OR_DEPARTMENT_OTHER): Payer: Medicare HMO

## 2021-09-09 VITALS — BP 160/60

## 2021-09-09 DIAGNOSIS — Z7982 Long term (current) use of aspirin: Secondary | ICD-10-CM | POA: Diagnosis not present

## 2021-09-09 DIAGNOSIS — I10 Essential (primary) hypertension: Secondary | ICD-10-CM | POA: Diagnosis not present

## 2021-09-09 DIAGNOSIS — R059 Cough, unspecified: Secondary | ICD-10-CM | POA: Diagnosis not present

## 2021-09-09 DIAGNOSIS — Z79899 Other long term (current) drug therapy: Secondary | ICD-10-CM | POA: Diagnosis not present

## 2021-09-09 DIAGNOSIS — M25522 Pain in left elbow: Secondary | ICD-10-CM

## 2021-09-09 DIAGNOSIS — M7989 Other specified soft tissue disorders: Secondary | ICD-10-CM | POA: Insufficient documentation

## 2021-09-09 DIAGNOSIS — G4733 Obstructive sleep apnea (adult) (pediatric): Secondary | ICD-10-CM | POA: Diagnosis not present

## 2021-09-09 DIAGNOSIS — R519 Headache, unspecified: Secondary | ICD-10-CM | POA: Diagnosis not present

## 2021-09-09 DIAGNOSIS — R202 Paresthesia of skin: Secondary | ICD-10-CM | POA: Diagnosis not present

## 2021-09-09 DIAGNOSIS — R079 Chest pain, unspecified: Secondary | ICD-10-CM | POA: Diagnosis not present

## 2021-09-09 DIAGNOSIS — M19022 Primary osteoarthritis, left elbow: Secondary | ICD-10-CM | POA: Diagnosis not present

## 2021-09-09 DIAGNOSIS — M79632 Pain in left forearm: Secondary | ICD-10-CM | POA: Diagnosis not present

## 2021-09-09 LAB — BASIC METABOLIC PANEL
Anion gap: 10 (ref 5–15)
BUN: 12 mg/dL (ref 8–23)
CO2: 27 mmol/L (ref 22–32)
Calcium: 9.9 mg/dL (ref 8.9–10.3)
Chloride: 97 mmol/L — ABNORMAL LOW (ref 98–111)
Creatinine, Ser: 0.9 mg/dL (ref 0.61–1.24)
GFR, Estimated: >60 mL/min (ref >60)
Glucose, Bld: 131 mg/dL — ABNORMAL HIGH (ref 70–99)
Potassium: 3.5 mmol/L (ref 3.5–5.1)
Sodium: 134 mmol/L — ABNORMAL LOW (ref 135–145)

## 2021-09-09 LAB — CBC
HCT: 36.6 % — ABNORMAL LOW (ref 39.0–52.0)
Hemoglobin: 12.7 g/dL — ABNORMAL LOW (ref 13.0–17.0)
MCH: 30.8 pg (ref 26.0–34.0)
MCHC: 34.7 g/dL (ref 30.0–36.0)
MCV: 88.6 fL (ref 80.0–100.0)
Platelets: 272 10*3/uL (ref 150–400)
RBC: 4.13 MIL/uL — ABNORMAL LOW (ref 4.22–5.81)
RDW: 11.6 % (ref 11.5–15.5)
WBC: 8 10*3/uL (ref 4.0–10.5)
nRBC: 0 % (ref 0.0–0.2)

## 2021-09-09 LAB — TROPONIN I (HIGH SENSITIVITY)
Troponin I (High Sensitivity): 3 ng/L (ref <18)
Troponin I (High Sensitivity): 4 ng/L (ref <18)

## 2021-09-09 MED ORDER — LIDOCAINE 5 % EX PTCH: 1.0000 | MEDICATED_PATCH | CUTANEOUS | 0 refills | Status: DC

## 2021-09-09 MED ORDER — CYCLOBENZAPRINE HCL 10 MG PO TABS: 10.0000 mg | ORAL_TABLET | Freq: Two times a day (BID) | ORAL | 0 refills | Status: DC | PRN

## 2021-09-09 MED ORDER — KETOROLAC TROMETHAMINE 60 MG/2ML IM SOLN
60.0000 mg | Freq: Once | INTRAMUSCULAR | Status: AC
  Administered 2021-09-09: 60 mg via INTRAMUSCULAR
  Filled 2021-09-09: qty 2

## 2021-09-09 MED ORDER — LIDOCAINE 5 % EX PTCH
1.0000 | MEDICATED_PATCH | CUTANEOUS | Status: DC
  Administered 2021-09-09: 1 via TRANSDERMAL
  Filled 2021-09-09: qty 1

## 2021-09-09 MED ORDER — DEXAMETHASONE SODIUM PHOSPHATE 10 MG/ML IJ SOLN
10.0000 mg | Freq: Once | INTRAMUSCULAR | Status: AC
  Administered 2021-09-09: 10 mg via INTRAVENOUS
  Filled 2021-09-09: qty 1

## 2021-09-09 NOTE — Discharge Instructions (Addendum)
You may take acetaminophen (Tylenol ) 1000 mg 4 times a day for 1 week. This is the maximum dose of Tylenol (acetaminophen) you can take from all sources. Please check other over-the-counter medications and prescriptions to ensure you are not taking other medications that contain acetaminophen.  You may also take ibuprofen 400 mg 6 times a day alternating with or at the same time as tylenol.  You may take these while using the flexeril and lidoderm patch. ?

## 2021-09-09 NOTE — Progress Notes (Addendum)
? ?Subjective:  ? ? Patient ID: [name], male    DOB: 12/22/1945, 76 y.o.   MRN: [id number] ? ?HPI ?76 year old male who  has a past medical history of Arthritis, Cataract, Complication of anesthesia, GERD (gastroesophageal reflux disease), Hyperlipidemia, Hypertension, Neuromuscular disorder ([place]), and Sleep apnea. ? ?He presents to the office today for acute issues.  ? ?Per patient report, he was sitting on the couch last night watching the NFL draft and had sudden left arm pain in the antecubital area as well as left-sided chest pain.  Noticed that when he tried to raise his arm that he could only bring it about halfway and when he did he would have left-sided chest pain.  When he woke up this morning his entire left arm and wrist to shoulder was swollen. ? ?He denies shortness of breath.  Has some decreased grip strength. ? ?No history of blood clots. ? ? ?Review of Systems ?See HPI  ? ?Past Medical History:  ?Diagnosis Date  ? Arthritis   ? Cataract   ? right eye removed   ? Complication of anesthesia   ? "hard to wake up post op" per pt and wife   ? GERD (gastroesophageal reflux disease)   ? Hyperlipidemia   ? Hypertension   ? Neuromuscular disorder ([place])   ? pinched nerve in foot  ? Sleep apnea   ? wears cpap   ? ? ?Social History  ? ?Socioeconomic History  ? Marital status: Married  ?  Spouse name: Not on file  ? Number of children: 2  ? Years of education: 47  ? Highest education level: High school graduate  ?Occupational History  ? Occupation: retired  ?Tobacco Use  ? Smoking status: Former  ?  Types: Cigarettes  ?  Quit date: 03/01/1967  ?  Years since quitting: 54.5  ? Smokeless tobacco: Never  ? Tobacco comments:  ?  Pt states he was a social smoker  ?Vaping Use  ? Vaping Use: Never used  ?Substance and Sexual Activity  ? Alcohol use: Yes  ?  Comment: holidays  ? Drug use: No  ? Sexual activity: Yes  ?Other Topics Concern  ? Not on file  ?Social History Narrative  ? Retired for an Equities trader for a [employer]  ? Two children who live in [place]  ? Married; [place] 2   ? ?Social Determinants of Health  ? ?Financial Resource Strain: Not on file  ?Food Insecurity: Not on file  ?Transportation Needs: Not on file  ?Physical Activity: Not on file  ?Stress: Not on file  ?Social Connections: Not on file  ?Intimate Partner Violence: Not on file  ? ? ?Past Surgical History:  ?Procedure Laterality Date  ? CATARACT EXTRACTION Right   ? COLONOSCOPY    ? JOINT REPLACEMENT    ? bilateral knee replacenent  ? TOTAL KNEE REVISION  05/30/2011  ? Procedure: TOTAL KNEE REVISION;  Surgeon: Meredith Pel, MD;  Location: [facility];  Service: Orthopedics;  Laterality: Right;  Right total knee arthroplasty polyethylene spacer exchange  ? ? ?Family History  ?Problem Relation Age of Onset  ? Diabetes Sister   ? Stroke Sister   ? Colon cancer Neg Hx   ? Colon polyps Neg Hx   ? Esophageal cancer Neg Hx   ? Rectal cancer Neg Hx   ? Stomach cancer Neg Hx   ? ? ?Allergies  ?Allergen Reactions  ? Lisinopril Cough  ? ? ?  Current Outpatient Medications on File Prior to Visit  ?Medication Sig Dispense Refill  ? amLODipine (NORVASC) 10 MG tablet TAKE 1 TABLET BY MOUTH EVERY DAY 90 tablet 0  ? aspirin 81 MG tablet Take 81 mg by mouth daily.    ? cyclobenzaprine (FLEXERIL) 10 MG tablet Take 1 tablet (10 mg total) by mouth at bedtime. 10 tablet 0  ? diclofenac Sodium (VOLTAREN) 1 % GEL Apply 4 g topically 4 (four) times daily. 150 g 2  ? gabapentin (NEURONTIN) 300 MG capsule Take 1 capsule (300 mg total) by mouth 2 (two) times daily. 120 capsule 1  ? hydrochlorothiazide (HYDRODIURIL) 25 MG tablet TAKE 1 TABLET BY MOUTH EVERY DAY 90 tablet 1  ? HYDROcodone bit-homatropine (HYCODAN) 5-1.5 MG/5ML syrup Take 5 mLs by mouth every 8 (eight) hours as needed for cough. 120 mL 0  ? losartan (COZAAR) 50 MG tablet TAKE 1 TABLET BY MOUTH EVERY DAY 90 tablet 0  ? olmesartan (BENICAR) 20 MG tablet Take 1 tablet (20 mg total) by mouth daily. 90  tablet 3  ? omeprazole (PRILOSEC) 40 MG capsule TAKE 1 CAPSULE BY MOUTH EVERY DAY 90 capsule 0  ? simvastatin (ZOCOR) 10 MG tablet TAKE 1 TABLET BY MOUTH EVERYDAY AT BEDTIME 90 tablet 3  ? ?No current facility-administered medications on file prior to visit.  ? ? ?BP (!) 160/60  ? ? ?   ?Objective:  ? Physical Exam ?Vitals and nursing note reviewed.  ?Constitutional:   ?   Appearance: Normal appearance.  ?Cardiovascular:  ?   Rate and Rhythm: Normal rate and regular rhythm.  ?   Pulses: Normal pulses.  ?   Heart sounds: Normal heart sounds.  ?Pulmonary:  ?   Effort: Pulmonary effort is normal.  ?   Breath sounds: Normal breath sounds.  ?Musculoskeletal:     ?   General: Swelling and tenderness present. Normal range of motion.  ?   Comments: Swelling noted throughout entire left arm.  He does have some discoloration to the left forearm.  Decreased range of motion in left shoulder.  ?Skin: ?   General: Skin is warm and dry.  ?Neurological:  ?   General: No focal deficit present.  ?   Mental Status: He is alert and oriented to person, place, and time.  ?Psychiatric:     ?   Mood and Affect: Mood normal.     ?   Behavior: Behavior normal.     ?   Thought Content: Thought content normal.     ?   Judgment: Judgment normal.  ? ?   ?Assessment & Plan:  ?1. Chest pain, unspecified type ? ?- EKG 12-Lead- Sinus Rhythm, Rate 80  ? ?2. Left arm swelling ?- Concern for DVT or Arterial Thoracic Outlet Syndrome.  ?- I believe he needs further workup for potentially life threatening condition. Will send to ER for further evaluation  ? ?[name], NP ? ? ? ?

## 2021-09-09 NOTE — ED Notes (Signed)
EMT-P provided AVS using Teachback Method. Patient verbalizes understanding of Discharge Instructions. Opportunity for Questioning and Answers were provided by EMT-P. Patient Discharged from ED.  ? ?

## 2021-09-09 NOTE — ED Triage Notes (Signed)
Patient reports to the ER for left arm swelling. Patient reports he was sent here to rule out DVT in the arm. Obvious swelling and patient reports pain up into the left pectoral as well. Denies N/V/D. Denies ShOB.  ?

## 2021-09-09 NOTE — ED Provider Notes (Signed)
?[facility] ?Provider Note ? ? ?CSN: [id number] ?Arrival date & time: 09/09/21  1547 ? ?  ? ?History ? ?Chief Complaint  ?Patient presents with  ? Arm Pain  ? ? ?[name] is a 76 y.o. male. ? ?HPI ? ?  ? ?Last night left AC/elbow  hurts more with bending, radiates to shoulder then feels some tingling in chest ?7/10 pain, when try to raise it in increased to a 9/10 ?Has swelling also to left hand ?Can't think of any triggers or using it more than usual ?No shortness of breath, abdominal pain, neck pain ?Dry cough for a while, when woke up had slight headache taht supbsided now ?When move arm feels some tingling in the chest ?Not having other chest discomfort with walking or exertion, just feels tingling at times with arm movement.  ?Has some leg swelling, chronic intermittent unchanged ?No fevers ?No nausea, vomiting ? ? ?Hx of arthritis, fingers, toes ?No gout history ?No hx of septic arthritis or bacteremia ?Worked in a job for many years with lifting. Has difficulty extending both elbows at baseline. ? ?Past Medical History:  ?Diagnosis Date  ? Arthritis   ? Cataract   ? right eye removed   ? Complication of anesthesia   ? "hard to wake up post op" per pt and wife   ? GERD (gastroesophageal reflux disease)   ? Hyperlipidemia   ? Hypertension   ? Neuromuscular disorder ([place])   ? pinched nerve in foot  ? Sleep apnea   ? wears cpap   ?  ? ?Home Medications ?Prior to Admission medications   ?Medication Sig Start Date End Date Taking? Authorizing Provider  ?cyclobenzaprine (FLEXERIL) 10 MG tablet Take 1 tablet (10 mg total) by mouth 2 (two) times daily as needed for muscle spasms. 09/09/21  Yes Gareth Morgan, MD  ?lidocaine (LIDODERM) 5 % Place 1 patch onto the skin daily. Remove & Discard patch within 12 hours or as directed by MD 09/09/21  Yes Gareth Morgan, MD  ?amLODipine (NORVASC) 10 MG tablet TAKE 1 TABLET BY MOUTH EVERY DAY 06/28/21   [name], NP  ?aspirin 81 MG  tablet Take 81 mg by mouth daily.    [provider]  ?diclofenac Sodium (VOLTAREN) 1 % GEL Apply 4 g topically 4 (four) times daily. 05/26/21   [name], DPM  ?gabapentin (NEURONTIN) 300 MG capsule Take 1 capsule (300 mg total) by mouth 2 (two) times daily. 11/11/20   [name], DPM  ?hydrochlorothiazide (HYDRODIURIL) 25 MG tablet TAKE 1 TABLET BY MOUTH EVERY DAY 04/01/21   Nafziger, Tommi Rumps, NP  ?HYDROcodone bit-homatropine (HYCODAN) 5-1.5 MG/5ML syrup Take 5 mLs by mouth every 8 (eight) hours as needed for cough. 03/17/21   Nafziger, Tommi Rumps, NP  ?losartan (COZAAR) 50 MG tablet TAKE 1 TABLET BY MOUTH EVERY DAY 06/28/21   Nafziger, Tommi Rumps, NP  ?olmesartan (BENICAR) 20 MG tablet Take 1 tablet (20 mg total) by mouth daily. 01/16/20   Nafziger, Tommi Rumps, NP  ?omeprazole (PRILOSEC) 40 MG capsule TAKE 1 CAPSULE BY MOUTH EVERY DAY 06/28/21   Nafziger, Tommi Rumps, NP  ?simvastatin (ZOCOR) 10 MG tablet TAKE 1 TABLET BY MOUTH EVERYDAY AT BEDTIME 10/06/20   [name], NP  ?   ? ?Allergies    ?Lisinopril   ? ?Review of Systems   ?Review of Systems ? ?Physical Exam ?Updated Vital Signs ?BP (!) 147/75   Pulse 81   Temp 98.6 ?F (37 ?C)   Resp (!) 26  Ht '5\' 9"'$  (1.753 m)   Wt 123.4 kg   SpO2 97%   BMI 40.17 kg/m?  ?Physical Exam ?Vitals and nursing note reviewed.  ?Constitutional:   ?   General: He is not in acute distress. ?   Appearance: He is well-developed. He is not diaphoretic.  ?HENT:  ?   Head: Normocephalic and atraumatic.  ?Eyes:  ?   Conjunctiva/sclera: Conjunctivae normal.  ?Cardiovascular:  ?   Rate and Rhythm: Normal rate and regular rhythm.  ?   Heart sounds: Normal heart sounds. No murmur heard. ?  No friction rub. No gallop.  ?Pulmonary:  ?   Effort: Pulmonary effort is normal. No respiratory distress.  ?   Breath sounds: Normal breath sounds. No wheezing or rales.  ?Abdominal:  ?   General: There is no distension.  ?   Palpations: Abdomen is soft.  ?   Tenderness: There is no abdominal tenderness. There  is no guarding.  ?Musculoskeletal:     ?   General: Tenderness present.  ?   Right elbow: Decreased range of motion.  ?   Left elbow: Swelling present. Decreased range of motion (severe pain with ROM elbow, unable to fullly extend elbows bilaterally). Tenderness present.  ?   Cervical back: Normal range of motion.  ?Skin: ?   General: Skin is warm and dry.  ?Neurological:  ?   Mental Status: He is alert and oriented to person, place, and time.  ? ? ?ED Results / Procedures / Treatments   ?Labs ?(all labs ordered are listed, but only abnormal results are displayed) ?Labs Reviewed  ?BASIC METABOLIC PANEL - Abnormal; Notable for the following components:  ?    Result Value  ? Sodium 134 (*)   ? Chloride 97 (*)   ? Glucose, Bld 131 (*)   ? All other components within normal limits  ?CBC - Abnormal; Notable for the following components:  ? RBC 4.13 (*)   ? Hemoglobin 12.7 (*)   ? HCT 36.6 (*)   ? All other components within normal limits  ?TROPONIN I (HIGH SENSITIVITY)  ?TROPONIN I (HIGH SENSITIVITY)  ? ? ?EKG ?EKG Interpretation ? ?Date/Time:  Friday September 09 2021 16:03:42 EDT ?Ventricular Rate:  91 ?PR Interval:  180 ?QRS Duration: 88 ?QT Interval:  370 ?QTC Calculation: 455 ?R Axis:   -77 ?Text Interpretation: Unusual P axis, possible ectopic atrial rhythm with Premature atrial complexes Left anterior fascicular block ST & T wave abnormality, consider inferior ischemia Abnormal ECG When compared with ECG of 30-May-2011 06:50, Ectopic atrial rhythm has replaced Sinus rhythm Left anterior fascicular block is now Present T wave inversion now evident in Inferior leads Confirmed by Gareth Morgan [phone]) on 09/09/2021 4:59:21 PM ? ?Radiology ?DG Chest 2 View ? ?Result Date: 09/09/2021 ?CLINICAL DATA:  Chest pain and left arm swelling EXAM: CHEST - 2 VIEW COMPARISON:  01/11/2017 FINDINGS: The heart size and mediastinal contours are within normal limits. Both lungs are clear. The visualized skeletal structures are  unremarkable. IMPRESSION: No active cardiopulmonary disease. Electronically Signed   By: Inez Catalina M.D.   On: 09/09/2021 19:00  ? ?DG Elbow Complete Left ? ?Result Date: 09/09/2021 ?CLINICAL DATA:  Left elbow pain and swelling. EXAM: LEFT ELBOW - COMPLETE 3+ VIEW COMPARISON:  None. FINDINGS: Mildly decreased bone mineralization. Well corticated 9 mm oval ossicle just medial to the medial epicondyle, chronic. Severe radiocapitellar joint space narrowing with mild-to-moderate peripheral degenerative spurring and calcification of the adjacent joint capsule.  Moderate medial elbow trochlear olecranon joint space narrowing with apparent calcification of the adjacent joint capsule. No definite elbow joint effusion.  No acute fracture or dislocation. Two small, well corticated chronic ossicles within the soft tissues just posterior to the olecranon. IMPRESSION: Moderate to severe osteoarthritis greatest within the radiocapitellar joint. Electronically Signed   By: Yvonne Kendall M.D.   On: 09/09/2021 19:00  ? ?US Venous Img Upper Uni Left ? ?Result Date: 09/09/2021 ?CLINICAL DATA:  Left arm pain and swelling. EXAM: LEFT UPPER EXTREMITY VENOUS DOPPLER ULTRASOUND TECHNIQUE: Gray-scale sonography with graded compression, as well as color Doppler and duplex ultrasound were performed to evaluate the upper extremity deep venous system from the level of the subclavian vein and including the jugular, axillary, basilic, radial, ulnar and upper cephalic vein. Spectral Doppler was utilized to evaluate flow at rest and with distal augmentation maneuvers. COMPARISON:  None. FINDINGS: Contralateral Subclavian Vein: No evidence of thrombus. Normal color Doppler flow and phasicity. Internal Jugular Vein: No thrombus. Normal compressibility, color Doppler flow and phasicity. Subclavian Vein: No thrombus. Normal color Doppler flow and phasicity. Axillary Vein: No thrombus. Normal compressibility, color Doppler flow and augmentation. Cephalic  Vein: No thrombus. Normal compressibility, color Doppler flow and augmentation. Basilic Vein: No thrombus. Normal compressibility, color Doppler flow and augmentation. Brachial Veins: No thrombus. Normal compressibility,

## 2021-09-10 ENCOUNTER — Other Ambulatory Visit: Payer: Self-pay | Admitting: Adult Health

## 2021-09-10 DIAGNOSIS — Z760 Encounter for issue of repeat prescription: Secondary | ICD-10-CM

## 2021-09-10 DIAGNOSIS — I10 Essential (primary) hypertension: Secondary | ICD-10-CM

## 2021-09-27 ENCOUNTER — Ambulatory Visit (INDEPENDENT_AMBULATORY_CARE_PROVIDER_SITE_OTHER): Payer: Medicare HMO | Admitting: Adult Health

## 2021-09-27 ENCOUNTER — Other Ambulatory Visit: Payer: Self-pay | Admitting: Adult Health

## 2021-09-27 ENCOUNTER — Encounter: Payer: Self-pay | Admitting: Adult Health

## 2021-09-27 VITALS — BP 128/60 | HR 78 | Temp 98.0°F | Ht 69.0 in | Wt 273.0 lb

## 2021-09-27 DIAGNOSIS — K219 Gastro-esophageal reflux disease without esophagitis: Secondary | ICD-10-CM | POA: Diagnosis not present

## 2021-09-27 DIAGNOSIS — E785 Hyperlipidemia, unspecified: Secondary | ICD-10-CM

## 2021-09-27 DIAGNOSIS — I10 Essential (primary) hypertension: Secondary | ICD-10-CM | POA: Diagnosis not present

## 2021-09-27 DIAGNOSIS — Z0000 Encounter for general adult medical examination without abnormal findings: Secondary | ICD-10-CM

## 2021-09-27 DIAGNOSIS — G4733 Obstructive sleep apnea (adult) (pediatric): Secondary | ICD-10-CM

## 2021-09-27 DIAGNOSIS — R972 Elevated prostate specific antigen [PSA]: Secondary | ICD-10-CM

## 2021-09-27 DIAGNOSIS — Z125 Encounter for screening for malignant neoplasm of prostate: Secondary | ICD-10-CM

## 2021-09-27 LAB — CBC WITH DIFFERENTIAL/PLATELET
Basophils Absolute: 0 10*3/uL (ref 0.0–0.1)
Basophils Relative: 1.1 % (ref 0.0–3.0)
Eosinophils Absolute: 0.2 10*3/uL (ref 0.0–0.7)
Eosinophils Relative: 3.9 % (ref 0.0–5.0)
HCT: 37.5 % — ABNORMAL LOW (ref 39.0–52.0)
Hemoglobin: 13.1 g/dL (ref 13.0–17.0)
Lymphocytes Relative: 38.9 % (ref 12.0–46.0)
Lymphs Abs: 1.7 10*3/uL (ref 0.7–4.0)
MCHC: 35 g/dL (ref 30.0–36.0)
MCV: 90.2 fl (ref 78.0–100.0)
Monocytes Absolute: 0.5 10*3/uL (ref 0.1–1.0)
Monocytes Relative: 10.8 % (ref 3.0–12.0)
Neutro Abs: 1.9 10*3/uL (ref 1.4–7.7)
Neutrophils Relative %: 45.3 % (ref 43.0–77.0)
Platelets: 217 10*3/uL (ref 150.0–400.0)
RBC: 4.16 Mil/uL — ABNORMAL LOW (ref 4.22–5.81)
RDW: 12.4 % (ref 11.5–15.5)
WBC: 4.2 10*3/uL (ref 4.0–10.5)

## 2021-09-27 LAB — COMPREHENSIVE METABOLIC PANEL
ALT: 13 U/L (ref 0–53)
AST: 13 U/L (ref 0–37)
Albumin: 4.3 g/dL (ref 3.5–5.2)
Alkaline Phosphatase: 93 U/L (ref 39–117)
BUN: 15 mg/dL (ref 6–23)
CO2: 27 mEq/L (ref 19–32)
Calcium: 10.2 mg/dL (ref 8.4–10.5)
Chloride: 101 mEq/L (ref 96–112)
Creatinine, Ser: 0.89 mg/dL (ref 0.40–1.50)
GFR: 83.22 mL/min (ref >60.00)
Glucose, Bld: 147 mg/dL — ABNORMAL HIGH (ref 70–99)
Potassium: 3.6 mEq/L (ref 3.5–5.1)
Sodium: 138 mEq/L (ref 135–145)
Total Bilirubin: 0.9 mg/dL (ref 0.2–1.2)
Total Protein: 7.6 g/dL (ref 6.0–8.3)

## 2021-09-27 LAB — LIPID PANEL
Cholesterol: 155 mg/dL (ref 0–200)
HDL: 45 mg/dL (ref >39.00)
LDL Cholesterol: 89 mg/dL (ref 0–99)
NonHDL: 110.44
Total CHOL/HDL Ratio: 3
Triglycerides: 109 mg/dL (ref 0.0–149.0)
VLDL: 21.8 mg/dL (ref 0.0–40.0)

## 2021-09-27 LAB — HEMOGLOBIN A1C: Hgb A1c MFr Bld: 5.8 % (ref 4.6–6.5)

## 2021-09-27 LAB — TSH: TSH: 1.4 u[IU]/mL (ref 0.35–5.50)

## 2021-09-27 LAB — PSA: PSA: 4.28 ng/mL — ABNORMAL HIGH (ref 0.10–4.00)

## 2021-09-27 MED ORDER — SIMVASTATIN 10 MG PO TABS: ORAL_TABLET | ORAL | 3 refills | Status: DC

## 2021-09-27 MED ORDER — OMEPRAZOLE 40 MG PO CPDR: 40.0000 mg | DELAYED_RELEASE_CAPSULE | Freq: Every day | ORAL | 3 refills | Status: DC

## 2021-09-27 MED ORDER — LOSARTAN POTASSIUM 50 MG PO TABS: 50.0000 mg | ORAL_TABLET | Freq: Every day | ORAL | 3 refills | Status: DC

## 2021-09-27 MED ORDER — AMLODIPINE BESYLATE 10 MG PO TABS: 10.0000 mg | ORAL_TABLET | Freq: Every day | ORAL | 3 refills | Status: DC

## 2021-09-27 NOTE — Patient Instructions (Signed)
Health Maintenance Due  ?Topic Date Due  ? Zoster Vaccines- Shingrix (1 of 2) Never done  ? COVID-19 Vaccine (3 - Booster for Alphonsa Overall series) 04/09/2020  ? ? ? ? Row Labels 09/27/2021  ?  7:24 AM 09/23/2020  ?  7:23 AM 05/19/2019  ? 11:37 AM  ?Depression screen PHQ 2/9   Section Header. No data exists in this row.     ?Decreased Interest   0 0 0  ?Down, Depressed, Hopeless   0 0 0  ?PHQ - 2 Score   0 0 0  ?Altered sleeping    0   ?Tired, decreased energy    0   ?Change in appetite    0   ?Feeling bad or failure about yourself     0   ?Trouble concentrating    0   ?Moving slowly or fidgety/restless    0   ?Suicidal thoughts    0   ?PHQ-9 Score    0   ?Difficult doing work/chores    Not difficult at all   ? ? ?

## 2021-09-27 NOTE — Progress Notes (Signed)
? ?Subjective:  ? ? Patient ID: WORTH KOBER, male    DOB: [birth year], 76 y.o.   MRN: [id number] ? ?HPI ?Patient presents for yearly preventative medicine examination. He is a pleasant 76 year old male who  has a past medical history of Arthritis, Cataract, Complication of anesthesia, GERD (gastroesophageal reflux disease), Hyperlipidemia, Hypertension, Neuromuscular disorder ([place]), and Sleep apnea. ? ?Hypertension -he is managed with Norvasc 10 mg daily, HCTZ 25 mg daily, and Cozaar 50 mg daily.  Denies shortness of breath, dizziness, lightheadedness, chest pain, or syncopal episodes ?BP Readings from Last 3 Encounters:  ?09/27/21 128/60  ?09/09/21 (!) 147/75  ?09/09/21 (!) 160/60  ? ?Hyperlipidemia-takes Zocor 10 mg daily.  He denies myalgia or fatigue ?Lab Results  ?Component Value Date  ? CHOL 135 09/23/2020  ? HDL 37.80 (L) 09/23/2020  ? [place] 84 09/23/2020  ? LDLDIRECT 167.6 07/08/2009  ? TRIG 65.0 09/23/2020  ? CHOLHDL 4 09/23/2020  ? ?OSA-uses CPAP on a nightly basis.  Denies daytime somnolence and feels well rested when he wakes up. ? ?GERD - controlled with prilosec 40 mg daily.  ? ? ?All immunizations and health maintenance protocols were reviewed with the patient and needed orders were placed. ? ?Appropriate screening laboratory values were ordered for the patient including screening of hyperlipidemia, renal function and hepatic function. ?If indicated by BPH, a PSA was ordered. ? ?Medication reconciliation,  past medical history, social history, problem list and allergies were reviewed in detail with the patient ? ?Goals were established with regard to weight loss, exercise, and  diet in compliance with medications ? ?Wt Readings from Last 3 Encounters:  ?09/27/21 273 lb (123.8 kg)  ?09/09/21 272 lb (123.4 kg)  ?05/30/21 273 lb (123.8 kg)  ? ? ?Review of Systems  ?Constitutional: Negative.   ?HENT: Negative.    ?Eyes: Negative.   ?Respiratory: Negative.    ?Cardiovascular: Negative.    ?Gastrointestinal: Negative.   ?Endocrine: Negative.   ?Genitourinary: Negative.   ?Musculoskeletal: Negative.   ?Skin: Negative.   ?Allergic/Immunologic: Negative.   ?Neurological: Negative.   ?Hematological: Negative.   ?Psychiatric/Behavioral: Negative.    ?All other systems reviewed and are negative. ? ?Past Medical History:  ?Diagnosis Date  ? Arthritis   ? Cataract   ? right eye removed   ? Complication of anesthesia   ? "hard to wake up post op" per pt and wife   ? GERD (gastroesophageal reflux disease)   ? Hyperlipidemia   ? Hypertension   ? Neuromuscular disorder ([place])   ? pinched nerve in foot  ? Sleep apnea   ? wears cpap   ? ? ?Social History  ? ?Socioeconomic History  ? Marital status: Married  ?  Spouse name: Not on file  ? Number of children: 2  ? Years of education: 82  ? Highest education level: High school graduate  ?Occupational History  ? Occupation: retired  ?Tobacco Use  ? Smoking status: Former  ?  Types: Cigarettes  ?  Quit date: 02/28/1966  ?  Years since quitting: 55.6  ? Smokeless tobacco: Never  ?Vaping Use  ? Vaping Use: Never used  ?Substance and Sexual Activity  ? Alcohol use: Yes  ?  Comment: holidays  ? Drug use: No  ? Sexual activity: Yes  ?Other Topics Concern  ? Not on file  ?Social History Narrative  ? Retired for an Research scientist (physical sciences) for a [employer]  ? Two children who live in [place]  ? Married;  HH 2   ? ?Social Determinants of Health  ? ?Financial Resource Strain: Not on file  ?Food Insecurity: Not on file  ?Transportation Needs: Not on file  ?Physical Activity: Not on file  ?Stress: Not on file  ?Social Connections: Not on file  ?Intimate Partner Violence: Not on file  ? ? ?Past Surgical History:  ?Procedure Laterality Date  ? CATARACT EXTRACTION Right   ? COLONOSCOPY    ? JOINT REPLACEMENT    ? bilateral knee replacenent  ? TOTAL KNEE REVISION  05/30/2011  ? Procedure: TOTAL KNEE REVISION;  Surgeon: Meredith Pel, MD;  Location: [facility];  Service:  Orthopedics;  Laterality: Right;  Right total knee arthroplasty polyethylene spacer exchange  ? ? ?Family History  ?Problem Relation Age of Onset  ? Diabetes Sister   ? Stroke Sister   ? Colon cancer Neg Hx   ? Colon polyps Neg Hx   ? Esophageal cancer Neg Hx   ? Rectal cancer Neg Hx   ? Stomach cancer Neg Hx   ? ? ?Allergies  ?Allergen Reactions  ? Lisinopril Cough  ? ? ?Current Outpatient Medications on File Prior to Visit  ?Medication Sig Dispense Refill  ? amLODipine (NORVASC) 10 MG tablet TAKE 1 TABLET BY MOUTH EVERY DAY 90 tablet 0  ? aspirin 81 MG tablet Take 81 mg by mouth daily.    ? cyclobenzaprine (FLEXERIL) 10 MG tablet Take 1 tablet (10 mg total) by mouth 2 (two) times daily as needed for muscle spasms. 20 tablet 0  ? gabapentin (NEURONTIN) 300 MG capsule Take 1 capsule (300 mg total) by mouth 2 (two) times daily. 120 capsule 1  ? hydrochlorothiazide (HYDRODIURIL) 25 MG tablet TAKE 1 TABLET BY MOUTH EVERY DAY 90 tablet 1  ? lidocaine (LIDODERM) 5 % Place 1 patch onto the skin daily. Remove & Discard patch within 12 hours or as directed by MD 30 patch 0  ? losartan (COZAAR) 50 MG tablet TAKE 1 TABLET BY MOUTH EVERY DAY 90 tablet 0  ? omeprazole (PRILOSEC) 40 MG capsule TAKE 1 CAPSULE BY MOUTH EVERY DAY 90 capsule 0  ? simvastatin (ZOCOR) 10 MG tablet TAKE 1 TABLET BY MOUTH EVERYDAY AT BEDTIME 90 tablet 3  ? ?No current facility-administered medications on file prior to visit.  ? ? ?BP 128/60   Pulse 78   Temp 98 ?F (36.7 ?C) (Oral)   Ht '5\' 9"'$  (1.753 m)   Wt 273 lb (123.8 kg)   SpO2 97%   BMI 40.32 kg/m?  ? ? ?   ?Objective:  ? Physical Exam ?Vitals and nursing note reviewed.  ?Constitutional:   ?   General: He is not in acute distress. ?   Appearance: Normal appearance. He is well-developed. He is obese.  ?HENT:  ?   Head: Normocephalic and atraumatic.  ?   Right Ear: Tympanic membrane, ear canal and external ear normal. There is no impacted cerumen.  ?   Left Ear: Tympanic membrane, ear canal and  external ear normal. There is no impacted cerumen.  ?   Nose: Nose normal. No congestion or rhinorrhea.  ?   Mouth/Throat:  ?   Mouth: Mucous membranes are moist.  ?   Pharynx: Oropharynx is clear. No oropharyngeal exudate or posterior oropharyngeal erythema.  ?Eyes:  ?   General:     ?   Right eye: No discharge.     ?   Left eye: No discharge.  ?   Extraocular Movements:  Extraocular movements intact.  ?   Conjunctiva/sclera: Conjunctivae normal.  ?   Pupils: Pupils are equal, round, and reactive to light.  ?Neck:  ?   Vascular: No carotid bruit.  ?   Trachea: No tracheal deviation.  ?Cardiovascular:  ?   Rate and Rhythm: Normal rate and regular rhythm.  ?   Pulses: Normal pulses.  ?   Heart sounds: Normal heart sounds. No murmur heard. ?  No friction rub. No gallop.  ?Pulmonary:  ?   Effort: Pulmonary effort is normal. No respiratory distress.  ?   Breath sounds: Normal breath sounds. No stridor. No wheezing, rhonchi or rales.  ?Chest:  ?   Chest wall: No tenderness.  ?Abdominal:  ?   General: Bowel sounds are normal. There is no distension.  ?   Palpations: Abdomen is soft. There is no mass.  ?   Tenderness: There is no abdominal tenderness. There is no right CVA tenderness, left CVA tenderness, guarding or rebound.  ?   Hernia: No hernia is present.  ?Musculoskeletal:     ?   General: No swelling, tenderness, deformity or signs of injury. Normal range of motion.  ?   Right lower leg: No edema.  ?   Left lower leg: No edema.  ?Lymphadenopathy:  ?   Cervical: No cervical adenopathy.  ?Skin: ?   General: Skin is warm and dry.  ?   Capillary Refill: Capillary refill takes less than 2 seconds.  ?   Coloration: Skin is not jaundiced or pale.  ?   Findings: No bruising, erythema, lesion or rash.  ?Neurological:  ?   General: No focal deficit present.  ?   Mental Status: He is alert and oriented to person, place, and time.  ?   Cranial Nerves: No cranial nerve deficit.  ?   Sensory: No sensory deficit.  ?   Motor: No  weakness.  ?   Coordination: Coordination normal.  ?   Gait: Gait normal.  ?   Deep Tendon Reflexes: Reflexes normal.  ?Psychiatric:     ?   Mood and Affect: Mood normal.     ?   Behavior: Behavior normal.     ?   Thought Content: [name]

## 2021-10-12 DIAGNOSIS — G4733 Obstructive sleep apnea (adult) (pediatric): Secondary | ICD-10-CM | POA: Diagnosis not present

## 2021-10-24 ENCOUNTER — Telehealth: Payer: Medicare HMO

## 2021-10-27 ENCOUNTER — Other Ambulatory Visit (INDEPENDENT_AMBULATORY_CARE_PROVIDER_SITE_OTHER): Payer: Medicare HMO

## 2021-10-27 DIAGNOSIS — R972 Elevated prostate specific antigen [PSA]: Secondary | ICD-10-CM | POA: Diagnosis not present

## 2021-10-27 LAB — PSA: PSA: 3.69 ng/mL (ref 0.10–4.00)

## 2021-11-11 DIAGNOSIS — G4733 Obstructive sleep apnea (adult) (pediatric): Secondary | ICD-10-CM | POA: Diagnosis not present

## 2021-11-22 ENCOUNTER — Telehealth: Payer: Self-pay | Admitting: Pharmacist

## 2021-11-22 NOTE — Chronic Care Management (AMB) (Signed)
    Chronic Care Management Pharmacy Assistant   Name: [name]  MRN: [id number] DOB: 03/05/1946  Reason for Encounter: Offer follow up with [name].   Recent office visits:  09/27/21 [name], NP - Patient presented for Routine general medical examination at a health care facility and other concerns. Changed Omeprazole. Stopped Cyclobenzaprine. Stopped Diclofenac. Stopped Gabapentin. Stopped Hydrocodone. Stopped Olmesartan.  09/09/21 [name], NP - Patient presented for Chest pain unspecified and other concerns. No medication changes.  Recent consult visits:  08/25/21 [name], DPM - Patient presented for Pain due to onychomycosis of toenails of both feet and other concerns. No medication changes.  Hospital visits:  Medication Reconciliation was completed by comparing discharge summary, patient's EMR and Pharmacy list, and upon discussion with patient.  Patient presented to [facility] ED on 09/09/21 due to Left elbow pain. Patient was present for 3 hours  New?Medications Started at [facility] Discharge:?? -started none  Medication Changes at Hospital Discharge: -Changed  cyclobenzaprine 10 MG tablet  Medications Discontinued at Hospital Discharge: -Stopped  none  Medications that remain the same after Hospital Discharge:??  -All other medications will remain the same.    Medications: Outpatient Encounter Medications as of 11/22/2021  Medication Sig   amLODipine (NORVASC) 10 MG tablet Take 1 tablet (10 mg total) by mouth daily.   aspirin 81 MG tablet Take 81 mg by mouth daily.   hydrochlorothiazide (HYDRODIURIL) 25 MG tablet TAKE 1 TABLET BY MOUTH EVERY DAY   lidocaine (LIDODERM) 5 % Place 1 patch onto the skin daily. Remove & Discard patch within 12 hours or as directed by MD   losartan (COZAAR) 50 MG tablet Take 1 tablet (50 mg total) by mouth daily.   omeprazole (PRILOSEC) 40 MG capsule Take 1 capsule (40 mg total) by mouth  daily.   simvastatin (ZOCOR) 10 MG tablet TAKE 1 TABLET BY MOUTH EVERYDAY AT BEDTIME   No facility-administered encounter medications on file as of 11/22/2021.   Notes: Call to patient did not reach left voicemail with my contact information for return call to schedule follow up with [facility].  Care Gaps: Zoster Vaccine - Overdue COVID Booster - Overdue BP- 126/60 09/27/21 AWV- Office aware to schedule as of 7/22  Star Rating Drugs: Simvastatin '10mg'$  - Last filled 09/27/21 90DS at [facility] Losartan '50mg'$  - Last filled 09/02/21 90DS at [facility] Pharmacist Assistant [phone]

## 2021-11-24 ENCOUNTER — Ambulatory Visit: Payer: Medicare HMO | Admitting: Podiatry

## 2021-11-24 DIAGNOSIS — M79674 Pain in right toe(s): Secondary | ICD-10-CM

## 2021-11-24 DIAGNOSIS — M79675 Pain in left toe(s): Secondary | ICD-10-CM | POA: Diagnosis not present

## 2021-11-24 DIAGNOSIS — B351 Tinea unguium: Secondary | ICD-10-CM | POA: Diagnosis not present

## 2021-11-24 MED ORDER — KETOCONAZOLE 2 % EX CREA: 1.0000 | TOPICAL_CREAM | Freq: Every day | CUTANEOUS | 0 refills | Status: DC

## 2021-12-01 NOTE — Progress Notes (Signed)
Subjective: 77 y.o. returns the office today for painful, elongated, thickened toenails which he cannot trim himself. Denies any redness or drainage around the nails.  Previously been seen by Dr. Cannon Kettle.  He has an appointment scheduled with neurology given nerve pain, burning, tingling, to his feet that she had ordered.  He denies any ulcerations or any new concerns.    PCP: [name], NP Last Seen: Sep 27, 2021  A1c: 5.8 on Sep 27, 2021  Objective: AAO 3, NAD DP/PT pulses palpable, CRT less than 3 seconds Protective sensation somewhat decreased with Semmes Weinstein monofilament although mild. Nails hypertrophic, dystrophic, elongated, brittle, discolored 10. There is tenderness overlying the nails 1-5 bilaterally. There is no surrounding erythema or drainage along the nail sites. No open lesions or pre-ulcerative lesions are identified. Dry, peeling skin present interdigitally as well as the sulcus of the toes. No other areas of tenderness bilateral lower extremities. No overlying edema, erythema, increased warmth. No pain with calf compression, swelling, warmth, erythema.  Assessment: Patient presents with symptomatic onychomycosis; likely tinea pedis  Plan: -Treatment options including alternatives, risks, complications were discussed -Nails sharply debrided 10 without complication/bleeding. -Follow-up with neurology as ordered by Dr. Cannon Kettle. -Prescribed ketoconazole.  Did debride some of the loose tissue today with any complications or bleeding. -Discussed daily foot inspection. If there are any changes, to call the office immediately.  -Follow-up in 3 months or sooner if any problems are to arise. In the meantime, encouraged to call the office with any questions, concerns, changes symptoms.  Celesta Gentile, DPM

## 2021-12-12 DIAGNOSIS — G4733 Obstructive sleep apnea (adult) (pediatric): Secondary | ICD-10-CM | POA: Diagnosis not present

## 2021-12-19 ENCOUNTER — Other Ambulatory Visit: Payer: Self-pay | Admitting: Podiatry

## 2021-12-19 ENCOUNTER — Other Ambulatory Visit: Payer: Self-pay | Admitting: Adult Health

## 2021-12-19 DIAGNOSIS — I10 Essential (primary) hypertension: Secondary | ICD-10-CM

## 2021-12-19 DIAGNOSIS — Z760 Encounter for issue of repeat prescription: Secondary | ICD-10-CM

## 2022-01-10 NOTE — Progress Notes (Unsigned)
NEUROLOGY CONSULTATION NOTE  [name] MRN: [id number] DOB: [birth year]  Referring provider: [name], DPM Primary care provider: [name], NP  Reason for consult:  neuritis  Assessment/Plan:   ***   Subjective:  [name] is a 76 year old male with HTN, HLD, arthritis and OSA who presents for neuritis.  History supplemented by podiatry notes.  Patient reports ***.  He has arthritis and onychomycosis of toenails in both feet contributing to pain as well.  Hgb A1c from May was 5.8 and TSH was 1.40.     PAST MEDICAL HISTORY: Past Medical History:  Diagnosis Date   Arthritis    Cataract    right eye removed    Complication of anesthesia    "hard to wake up post op" per pt and wife    GERD (gastroesophageal reflux disease)    Hyperlipidemia    Hypertension    Neuromuscular disorder ([place])    pinched nerve in foot   Sleep apnea    wears cpap     PAST SURGICAL HISTORY: Past Surgical History:  Procedure Laterality Date   CATARACT EXTRACTION Right    COLONOSCOPY     JOINT REPLACEMENT     bilateral knee replacenent   TOTAL KNEE REVISION  05/30/2011   Procedure: TOTAL KNEE REVISION;  Surgeon: Meredith Pel, MD;  Location: [facility];  Service: Orthopedics;  Laterality: Right;  Right total knee arthroplasty polyethylene spacer exchange    MEDICATIONS: Current Outpatient Medications on File Prior to Visit  Medication Sig Dispense Refill   amLODipine (NORVASC) 10 MG tablet Take 1 tablet (10 mg total) by mouth daily. 90 tablet 3   aspirin 81 MG tablet Take 81 mg by mouth daily.     hydrochlorothiazide (HYDRODIURIL) 25 MG tablet TAKE 1 TABLET BY MOUTH EVERY DAY 90 tablet 1   ketoconazole (NIZORAL) 2 % cream APPLY 1 APPLICATION TOPICALLY DAILY 60 g 0   lidocaine (LIDODERM) 5 % Place 1 patch onto the skin daily. Remove & Discard patch within 12 hours or as directed by MD 30 patch 0   losartan (COZAAR) 50 MG tablet Take 1 tablet (50 mg total) by mouth  daily. 90 tablet 3   omeprazole (PRILOSEC) 40 MG capsule Take 1 capsule (40 mg total) by mouth daily. 90 capsule 3   simvastatin (ZOCOR) 10 MG tablet TAKE 1 TABLET BY MOUTH EVERYDAY AT BEDTIME 90 tablet 3   No current facility-administered medications on file prior to visit.    ALLERGIES: Allergies  Allergen Reactions   Lisinopril Cough    FAMILY HISTORY: Family History  Problem Relation Age of Onset   Diabetes Sister    Stroke Sister    Colon cancer Neg Hx    Colon polyps Neg Hx    Esophageal cancer Neg Hx    Rectal cancer Neg Hx    Stomach cancer Neg Hx     Objective:  *** General: No acute distress.  Patient appears well-groomed.   Head:  Normocephalic/atraumatic Eyes:  fundi examined but not visualized Neck: supple, no paraspinal tenderness, full range of motion Back: No paraspinal tenderness Heart: regular rate and rhythm Lungs: Clear to auscultation bilaterally. Vascular: No carotid bruits. Neurological Exam: Mental status: alert and oriented to person, place, and time, speech fluent and not dysarthric, language intact. Cranial nerves: CN I: not tested CN II: pupils equal, round and reactive to light, visual fields intact CN III, IV, VI:  full range of motion, no nystagmus, no  ptosis CN V: facial sensation intact. CN VII: upper and lower face symmetric CN VIII: hearing intact CN IX, X: gag intact, uvula midline CN XI: sternocleidomastoid and trapezius muscles intact CN XII: tongue midline Bulk & Tone: normal, no fasciculations. Motor:  muscle strength 5/5 throughout Sensation:  Pinprick, temperature and vibratory sensation intact. Deep Tendon Reflexes:  2+ throughout,  toes downgoing.   Finger to nose testing:  Without dysmetria.   Heel to shin:  Without dysmetria.   Gait:  Normal station and stride.  Romberg negative.    Thank you for allowing me to take part in the care of this patient.  Metta Clines, DO  CC: ***

## 2022-01-11 DIAGNOSIS — G4733 Obstructive sleep apnea (adult) (pediatric): Secondary | ICD-10-CM | POA: Diagnosis not present

## 2022-01-12 ENCOUNTER — Other Ambulatory Visit (HOSPITAL_COMMUNITY): Payer: Self-pay

## 2022-01-12 ENCOUNTER — Encounter: Payer: Self-pay | Admitting: Neurology

## 2022-01-12 ENCOUNTER — Ambulatory Visit: Payer: Medicare HMO | Admitting: Neurology

## 2022-01-12 ENCOUNTER — Telehealth (HOSPITAL_COMMUNITY): Payer: Self-pay | Admitting: Pharmacy Technician

## 2022-01-12 VITALS — BP 102/62 | HR 86 | Ht 69.0 in | Wt 271.8 lb

## 2022-01-12 DIAGNOSIS — M79672 Pain in left foot: Secondary | ICD-10-CM | POA: Diagnosis not present

## 2022-01-12 DIAGNOSIS — R200 Anesthesia of skin: Secondary | ICD-10-CM | POA: Diagnosis not present

## 2022-01-12 DIAGNOSIS — M79671 Pain in right foot: Secondary | ICD-10-CM | POA: Diagnosis not present

## 2022-01-12 DIAGNOSIS — R202 Paresthesia of skin: Secondary | ICD-10-CM | POA: Diagnosis not present

## 2022-01-12 MED ORDER — PREGABALIN 25 MG PO CAPS: 25.0000 mg | ORAL_CAPSULE | Freq: Two times a day (BID) | ORAL | 5 refills | Status: DC

## 2022-01-12 NOTE — Telephone Encounter (Signed)
Patient Advocate Encounter  Prior Authorization for Pregabalin '25MG'$  capsules has been approved.    PA# [id number] Effective dates: 01/12/2022 through 05/14/2022      Lyndel Safe, [place] Patient Advocate Specialist [facility] Patient Advocate Team Direct Number: [phone]  Fax: [phone]

## 2022-01-12 NOTE — Telephone Encounter (Signed)
Patient Advocate Encounter   Received notification that prior authorization for Pregabalin '25MG'$  capsules is required.   PA submitted on 01/12/2022 Key [id number] Status is pending       Lyndel Safe, [place] Patient Advocate Specialist [facility] Patient Advocate Team Direct Number: [phone]  Fax: [phone]

## 2022-01-12 NOTE — Patient Instructions (Signed)
Start pregablin '25mg'$  twice daily.  If no improvement by time for need of refill, contact me and I will increase dose Schedule nerve study of right hand and leg. Further recommendations pending results.  Otherwise, follow up in 4 months.

## 2022-01-20 ENCOUNTER — Telehealth: Payer: Medicare HMO

## 2022-02-11 DIAGNOSIS — G4733 Obstructive sleep apnea (adult) (pediatric): Secondary | ICD-10-CM | POA: Diagnosis not present

## 2022-02-23 ENCOUNTER — Ambulatory Visit (INDEPENDENT_AMBULATORY_CARE_PROVIDER_SITE_OTHER): Payer: Medicare HMO | Admitting: Adult Health

## 2022-02-23 ENCOUNTER — Encounter: Payer: Self-pay | Admitting: Adult Health

## 2022-02-23 ENCOUNTER — Encounter: Payer: Medicare HMO | Admitting: Neurology

## 2022-02-23 ENCOUNTER — Ambulatory Visit: Payer: Medicare HMO | Admitting: Podiatry

## 2022-02-23 VITALS — BP 110/60 | HR 76 | Temp 100.9°F | Ht 69.0 in | Wt 269.0 lb

## 2022-02-23 DIAGNOSIS — R509 Fever, unspecified: Secondary | ICD-10-CM | POA: Diagnosis not present

## 2022-02-23 DIAGNOSIS — U071 COVID-19: Secondary | ICD-10-CM | POA: Diagnosis not present

## 2022-02-23 DIAGNOSIS — R051 Acute cough: Secondary | ICD-10-CM

## 2022-02-23 LAB — POCT INFLUENZA A/B
Influenza A, POC: NEGATIVE
Influenza B, POC: NEGATIVE

## 2022-02-23 LAB — POC COVID19 BINAXNOW

## 2022-02-23 MED ORDER — NIRMATRELVIR/RITONAVIR (PAXLOVID)TABLET: 3.0000 | ORAL_TABLET | Freq: Two times a day (BID) | ORAL | 0 refills | Status: AC

## 2022-02-23 NOTE — Progress Notes (Signed)
Subjective:    Patient ID: [name], male    DOB: 06-19-1945, 76 y.o.   MRN: [id number]  HPI 76 year old male who  has a past medical history of Arthritis, Cataract, Complication of anesthesia, GERD (gastroesophageal reflux disease), Hyperlipidemia, Hypertension, Neuromuscular disorder ([place]), and Sleep apnea.  He presents to the office today for 2 days of a nonproductive cough that feels similar to his cough that he has had in the past.  He does report developing a fever this morning.  Has been taking Tylenol at home. Denies SOB or wheezing    Review of Systems See HPI   Past Medical History:  Diagnosis Date   Arthritis    Cataract    right eye removed    Complication of anesthesia    "hard to wake up post op" per pt and wife    GERD (gastroesophageal reflux disease)    Hyperlipidemia    Hypertension    Neuromuscular disorder ([facility])    pinched nerve in foot   Sleep apnea    wears cpap     Social History   Socioeconomic History   Marital status: Married    Spouse name: Not on file   Number of children: 2   Years of education: 12   Highest education level: High school graduate  Occupational History   Occupation: retired  Tobacco Use   Smoking status: Former    Types: Cigarettes    Quit date: 02/28/1966    Years since quitting: 56.0   Smokeless tobacco: Never  Vaping Use   Vaping Use: Never used  Substance and Sexual Activity   Alcohol use: Yes    Comment: holidays   Drug use: No   Sexual activity: Yes  Other Topics Concern   Not on file  Social History Narrative   Retired for an Research scientist (physical sciences) for a [employer]   Two children who live in [place]   Married; [facility] 2    Right handed   Social Determinants of Health   Financial Resource Strain: [place]  (01/15/2020)   Overall Financial Resource Strain (CARDIA)    Difficulty of Paying Living Expenses: Not hard at all  [place]: Not on file  Transportation Needs: No Transportation  Needs (01/15/2020)   PRAPARE - Hydrologist (Medical): No    Lack of Transportation (Non-Medical): No  Physical Activity: Insufficiently Active (05/19/2019)   Exercise Vital Sign    Days of Exercise per Week: 7 days    Minutes of Exercise per Session: 10 min  Stress: Not on file  Social Connections: Unknown (05/19/2019)   Social Connection and Isolation Panel [NHANES]    Frequency of Communication with Friends and Family: More than three times a week    Frequency of Social Gatherings with Friends and Family: Once a week    Attends Religious Services: Not on Advertising copywriter or Organizations: Not on file    Attends Archivist Meetings: Not on file    Marital Status: Married  Intimate Partner Violence: Not on file    Past Surgical History:  Procedure Laterality Date   CATARACT EXTRACTION Right    COLONOSCOPY     JOINT REPLACEMENT     bilateral knee replacenent   TOTAL KNEE REVISION  05/30/2011   Procedure: TOTAL KNEE REVISION;  Surgeon: Meredith Pel, MD;  Location: [facility];  Service: Orthopedics;  Laterality: Right;  Right total  knee arthroplasty polyethylene spacer exchange    Family History  Problem Relation Age of Onset   Diabetes Sister    Stroke Sister    Colon cancer Neg Hx    Colon polyps Neg Hx    Esophageal cancer Neg Hx    Rectal cancer Neg Hx    Stomach cancer Neg Hx     Allergies  Allergen Reactions   Lisinopril Cough    Current Outpatient Medications on File Prior to Visit  Medication Sig Dispense Refill   amLODipine (NORVASC) 10 MG tablet Take 1 tablet (10 mg total) by mouth daily. 90 tablet 3   aspirin 81 MG tablet Take 81 mg by mouth daily.     hydrochlorothiazide (HYDRODIURIL) 25 MG tablet TAKE 1 TABLET BY MOUTH EVERY DAY 90 tablet 1   ketoconazole (NIZORAL) 2 % cream APPLY 1 APPLICATION TOPICALLY DAILY 60 g 0   lidocaine (LIDODERM) 5 % Place 1 patch onto the skin daily. Remove & Discard patch within  12 hours or as directed by MD 30 patch 0   losartan (COZAAR) 50 MG tablet Take 1 tablet (50 mg total) by mouth daily. 90 tablet 3   omeprazole (PRILOSEC) 40 MG capsule Take 1 capsule (40 mg total) by mouth daily. 90 capsule 3   pregabalin (LYRICA) 25 MG capsule Take 1 capsule (25 mg total) by mouth 2 (two) times daily. 60 capsule 5   simvastatin (ZOCOR) 10 MG tablet TAKE 1 TABLET BY MOUTH EVERYDAY AT BEDTIME 90 tablet 3   No current facility-administered medications on file prior to visit.    BP 110/60   Pulse 76   Temp (!) 100.9 F (38.3 C) (Oral)   Ht '5\' 9"'$  (1.753 m)   Wt 269 lb (122 kg)   SpO2 98%   BMI 39.72 kg/m       Objective:   Physical Exam Vitals and nursing note reviewed.  Constitutional:      Appearance: Normal appearance.  Cardiovascular:     Rate and Rhythm: Normal rate and regular rhythm.     Pulses: Normal pulses.     Heart sounds: Normal heart sounds.  Pulmonary:     Effort: Pulmonary effort is normal.     Breath sounds: Normal breath sounds.  Skin:    General: Skin is warm and dry.  Neurological:     General: No focal deficit present.     Mental Status: He is alert and oriented to person, place, and time.  Psychiatric:        Mood and Affect: Mood normal.        Behavior: Behavior normal.        Thought Content: Thought content normal.       Assessment & Plan:  1. Acute cough  - POC Influenza A/B- negative - POC COVID-19- positive   2. Fever, unspecified fever cause  - POC Influenza A/B - POC COVID-19  3. COVID-19 virus infection  - nirmatrelvir/ritonavir EUA (PAXLOVID) 20 x 150 MG & 10 x '100MG'$  TABS; Take 3 tablets by mouth 2 (two) times daily for 5 days. (Take nirmatrelvir 150 mg two tablets twice daily for 5 days and ritonavir 100 mg one tablet twice daily for 5 days) Patient GFR is 73  Dispense: 30 tablet; Refill: 0  [name], NP

## 2022-03-02 ENCOUNTER — Ambulatory Visit: Payer: Medicare HMO | Admitting: Podiatry

## 2022-03-13 ENCOUNTER — Ambulatory Visit: Payer: Medicare HMO | Admitting: Podiatry

## 2022-03-13 DIAGNOSIS — M79674 Pain in right toe(s): Secondary | ICD-10-CM | POA: Diagnosis not present

## 2022-03-13 DIAGNOSIS — B351 Tinea unguium: Secondary | ICD-10-CM | POA: Diagnosis not present

## 2022-03-13 DIAGNOSIS — M79675 Pain in left toe(s): Secondary | ICD-10-CM

## 2022-03-13 DIAGNOSIS — G4733 Obstructive sleep apnea (adult) (pediatric): Secondary | ICD-10-CM | POA: Diagnosis not present

## 2022-03-13 NOTE — Progress Notes (Unsigned)
Subjective: Chief Complaint  Patient presents with   Nail Problem    Routine foot care     76 y.o. returns the office today for painful, elongated, thickened toenails which he cannot trim himself. Denies any redness or drainage around the nails.    Describes stiffness to the feet (no pain).  He has follow-up with neurology.  PCP: [name], NP Last Seen: February 23, 2022  A1c: 5.8 on Sep 27, 2021  Objective: AAO 3, NAD DP/PT pulses palpable, CRT less than 3 seconds Protective sensation somewhat decreased with Semmes Weinstein monofilament although mild. Nails hypertrophic, dystrophic, elongated, brittle, discolored 10. There is tenderness overlying the nails 1-5 bilaterally. There is no surrounding erythema or drainage along the nail sites. No open lesions or pre-ulcerative lesions are identified. Dry, peeling skin present interdigitally as well as the sulcus of the toes. No other areas of tenderness bilateral lower extremities. No overlying edema, erythema, increased warmth. No pain with calf compression, swelling, warmth, erythema.  Assessment: Patient presents with symptomatic onychomycosis  Plan: -Treatment options including alternatives, risks, complications were discussed -Nails sharply debrided 10 without complication/bleeding. -Follow-up with neurology as ordered by Dr. Cannon Kettle. -Discussed second stretch exercises to help with the stiffness. -Discussed daily foot inspection. If there are any changes, to call the office immediately.  -Follow-up in 3 months or sooner if any problems are to arise. In the meantime, encouraged to call the office with any questions, concerns, changes symptoms.  Celesta Gentile, DPM

## 2022-03-13 NOTE — Patient Instructions (Signed)
For inserts I like POWERSTEPS, SUPERFEET, [employer]  Plantar Fasciitis (Heel Spur Syndrome) with Rehab The plantar fascia is a fibrous, ligament-like, soft-tissue structure that spans the bottom of the foot. Plantar fasciitis is a condition that causes pain in the foot due to inflammation of the tissue. SYMPTOMS  Pain and tenderness on the underneath side of the foot. Pain that worsens with standing or walking. CAUSES  Plantar fasciitis is caused by irritation and injury to the plantar fascia on the underneath side of the foot. Common mechanisms of injury include: Direct trauma to bottom of the foot. Damage to a small nerve that runs under the foot where the main fascia attaches to the heel bone. Stress placed on the plantar fascia due to bone spurs. RISK INCREASES WITH:  Activities that place stress on the plantar fascia (running, jumping, pivoting, or cutting). Poor strength and flexibility. Improperly fitted shoes. Tight calf muscles. Flat feet. Failure to warm-up properly before activity. Obesity. PREVENTION Warm up and stretch properly before activity. Allow for adequate recovery between workouts. Maintain physical fitness: Strength, flexibility, and endurance. Cardiovascular fitness. Maintain a health body weight. Avoid stress on the plantar fascia. Wear properly fitted shoes, including arch supports for individuals who have flat feet.  PROGNOSIS  If treated properly, then the symptoms of plantar fasciitis usually resolve without surgery. However, occasionally surgery is necessary.  RELATED COMPLICATIONS  Recurrent symptoms that may result in a chronic condition. Problems of the lower back that are caused by compensating for the injury, such as limping. Pain or weakness of the foot during push-off following surgery. Chronic inflammation, scarring, and partial or complete fascia tear, occurring more often from repeated injections.  TREATMENT  Treatment initially involves  the use of ice and medication to help reduce pain and inflammation. The use of strengthening and stretching exercises may help reduce pain with activity, especially stretches of the Achilles tendon. These exercises may be performed at home or with a therapist. Your caregiver may recommend that you use heel cups of arch supports to help reduce stress on the plantar fascia. Occasionally, corticosteroid injections are given to reduce inflammation. If symptoms persist for greater than 6 months despite non-surgical (conservative), then surgery may be recommended.   MEDICATION  If pain medication is necessary, then nonsteroidal anti-inflammatory medications, such as aspirin and ibuprofen, or other minor pain relievers, such as acetaminophen, are often recommended. Do not take pain medication within 7 days before surgery. Prescription pain relievers may be given if deemed necessary by your caregiver. Use only as directed and only as much as you need. Corticosteroid injections may be given by your caregiver. These injections should be reserved for the most serious cases, because they may only be given a certain number of times.  HEAT AND COLD Cold treatment (icing) relieves pain and reduces inflammation. Cold treatment should be applied for 10 to 15 minutes every 2 to 3 hours for inflammation and pain and immediately after any activity that aggravates your symptoms. Use ice packs or massage the area with a piece of ice (ice massage). Heat treatment may be used prior to performing the stretching and strengthening activities prescribed by your caregiver, physical therapist, or athletic trainer. Use a heat pack or soak the injury in warm water.  SEEK IMMEDIATE MEDICAL CARE IF: Treatment seems to offer no benefit, or the condition worsens. Any medications produce adverse side effects.  EXERCISES- RANGE OF MOTION (ROM) AND STRETCHING EXERCISES - Plantar Fasciitis (Heel Spur Syndrome) These exercises may help  you when beginning to rehabilitate your injury. Your symptoms may resolve with or without further involvement from your physician, physical therapist or athletic trainer. While completing these exercises, remember:  Restoring tissue flexibility helps normal motion to return to the joints. This allows healthier, less painful movement and activity. An effective stretch should be held for at least 30 seconds. A stretch should never be painful. You should only feel a gentle lengthening or release in the stretched tissue.  RANGE OF MOTION - Toe Extension, Flexion Sit with your right / left leg crossed over your opposite knee. Grasp your toes and gently pull them back toward the top of your foot. You should feel a stretch on the bottom of your toes and/or foot. Hold this stretch for 10 seconds. Now, gently pull your toes toward the bottom of your foot. You should feel a stretch on the top of your toes and or foot. Hold this stretch for 10 seconds. Repeat  times. Complete this stretch 3 times per day.   RANGE OF MOTION - Ankle Dorsiflexion, Active Assisted Remove shoes and sit on a chair that is preferably not on a carpeted surface. Place right / left foot under knee. Extend your opposite leg for support. Keeping your heel down, slide your right / left foot back toward the chair until you feel a stretch at your ankle or calf. If you do not feel a stretch, slide your bottom forward to the edge of the chair, while still keeping your heel down. Hold this stretch for 10 seconds. Repeat 3 times. Complete this stretch 2 times per day.   STRETCH  Gastroc, Standing Place hands on wall. Extend right / left leg, keeping the front knee somewhat bent. Slightly point your toes inward on your back foot. Keeping your right / left heel on the floor and your knee straight, shift your weight toward the wall, not allowing your back to arch. You should feel a gentle stretch in the right / left calf. Hold this position  for 10 seconds. Repeat 3 times. Complete this stretch 2 times per day.  STRETCH  Soleus, Standing Place hands on wall. Extend right / left leg, keeping the other knee somewhat bent. Slightly point your toes inward on your back foot. Keep your right / left heel on the floor, bend your back knee, and slightly shift your weight over the back leg so that you feel a gentle stretch deep in your back calf. Hold this position for 10 seconds. Repeat 3 times. Complete this stretch 2 times per day.  STRETCH  Gastrocsoleus, Standing  Note: This exercise can place a lot of stress on your foot and ankle. Please complete this exercise only if specifically instructed by your caregiver.  Place the ball of your right / left foot on a step, keeping your other foot firmly on the same step. Hold on to the wall or a rail for balance. Slowly lift your other foot, allowing your body weight to press your heel down over the edge of the step. You should feel a stretch in your right / left calf. Hold this position for 10 seconds. Repeat this exercise with a slight bend in your right / left knee. Repeat 3 times. Complete this stretch 2 times per day.   STRENGTHENING EXERCISES - Plantar Fasciitis (Heel Spur Syndrome)  These exercises may help you when beginning to rehabilitate your injury. They may resolve your symptoms with or without further involvement from your physician, physical therapist or athletic trainer.  While completing these exercises, remember:  Muscles can gain both the endurance and the strength needed for everyday activities through controlled exercises. Complete these exercises as instructed by your physician, physical therapist or athletic trainer. Progress the resistance and repetitions only as guided.  STRENGTH - Towel Curls Sit in a chair positioned on a non-carpeted surface. Place your foot on a towel, keeping your heel on the floor. Pull the towel toward your heel by only curling your toes.  Keep your heel on the floor. Repeat 3 times. Complete this exercise 2 times per day.  STRENGTH - Ankle Inversion Secure one end of a rubber exercise band/tubing to a fixed object (table, pole). Loop the other end around your foot just before your toes. Place your fists between your knees. This will focus your strengthening at your ankle. Slowly, pull your big toe up and in, making sure the band/tubing is positioned to resist the entire motion. Hold this position for 10 seconds. Have your muscles resist the band/tubing as it slowly pulls your foot back to the starting position. Repeat 3 times. Complete this exercises 2 times per day.  Document Released: 05/01/2005 Document Revised: 07/24/2011 Document Reviewed: 08/13/2008 [facility] Patient Information 2014 [place], [place].

## 2022-03-14 ENCOUNTER — Other Ambulatory Visit: Payer: Self-pay | Admitting: Podiatry

## 2022-03-23 ENCOUNTER — Ambulatory Visit: Payer: Medicare HMO | Admitting: Neurology

## 2022-03-23 DIAGNOSIS — G5601 Carpal tunnel syndrome, right upper limb: Secondary | ICD-10-CM

## 2022-03-23 DIAGNOSIS — R200 Anesthesia of skin: Secondary | ICD-10-CM | POA: Diagnosis not present

## 2022-03-23 DIAGNOSIS — M5417 Radiculopathy, lumbosacral region: Secondary | ICD-10-CM

## 2022-03-23 DIAGNOSIS — R202 Paresthesia of skin: Secondary | ICD-10-CM | POA: Diagnosis not present

## 2022-03-23 NOTE — Procedures (Signed)
[facility]  [facility] [employer] [facility] [place]  [place], [place] [address] Tel: [phone] Fax: [phone] Test Date:  03/23/2022  Patient: [name] DOB: 07-07-1945 Physician: Narda Amber, DO  Sex: Male Height: '5\' 9"'$  Ref Phys: Metta Clines, DO  ID#: [id number]   Technician:    History: This is a 76 year old man referred for evaluation of generalized paresthesias of hands and feet..  NCV & EMG Findings: Extensive electrodiagnostic testing of the right upper and lower extremities shows:  Right median sensory response is absent.  Right ulnar sensory response shows prolonged peak latency (3.3 ms) and reduced amplitude (4.4 V).  Right radial sensory response is within normal limits. Right median motor response shows severely prolonged latency (11.6 ms).  Of note, there is evidence of a right Martin-Gruber anastomosis, a normal anatomic variant.  Right ulnar motor response shows slowed conduction velocity across the elbow (A Elbow-B Elbow, 31 m/s).  Right peroneal and tibial motor responses are within normal limits.   Chronic motor axonal loss changes are seen affecting the abductor pollicis brevis, tibialis anterior, and flexor digitorum longus muscles, without accompanied active denervation.    Impression: Right median neuropathy at or distal to the wrist, consistent with a clinical diagnosis of carpal tunnel syndrome.  Overall, these findings are severe in degree electrically. Right ulnar neuropathy with slowing across the elbow, predominantly demyelinating, moderate. Chronic L5 radiculopathy affecting the right lower extremity, mild. There is no evidence of a large fiber sensorimotor polyneuropathy affecting the right side.   ___________________________ Narda Amber, DO    Nerve Conduction Studies   Stim Site NR Peak (ms) Norm Peak (ms) O-P Amp (V) Norm O-P Amp  Right Median Anti Sensory (2nd Digit)  34 C  Wrist *NR  <3.8  >10  Right Radial Anti Sensory (Base 1st  Digit)  34 C  Wrist    2.7 <2.8 13.2 >10  Right Sup Peroneal Anti Sensory (Ant Lat Mall)  34 C  12 cm    2.8 <4.6 4.2 >3  Right Sural Anti Sensory (Lat Mall)  34 C  Calf    3.0 <4.6 6.5 >3  Right Ulnar Anti Sensory (5th Digit)  34 C  Wrist    *3.3 <3.2 *4.4 >5     Stim Site NR Onset (ms) Norm Onset (ms) O-P Amp (mV) Norm O-P Amp Site1 Site2 Delta-0 (ms) Dist (cm) Vel (m/s) Norm Vel (m/s)  Right Median Motor (Abd Poll Brev)  34 C  Wrist    *11.6 <4.0 5.8 >5 Elbow Wrist 4.4 32.0 73 >50  Elbow    16.0  4.9  Ulnar-wrist crossover Elbow 11.2 0.0    Ulnar-wrist crossover    4.8  1.2         Right Peroneal Motor (Ext Dig Brev)  34 C  Ankle    3.4 <6.0 3.2 >2.5 B Fib Ankle 8.3 36.0 43 >40  B Fib    11.7  3.1  Poplt B Fib 1.8 8.0 44 >40  Poplt    13.5  2.9         Right Tibial Motor (Abd Hall Brev)  34 C  Ankle    3.1 <6.0 5.2 >4 Knee Ankle 10.1 44.0 44 >40  Knee    13.2  3.4         Right Ulnar Motor (Abd Dig Minimi)  34 C  Wrist    2.8 <3.1 9.7 >7 B Elbow Wrist 4.6 23.0 50 >50  B Elbow  7.4  8.6  A Elbow B Elbow 3.2 10.0 *31 >50  A Elbow    10.6  8.6          Electromyography   Side Muscle Ins.Act Fibs Fasc Recrt Amp Dur Poly Activation Comment  Right 1stDorInt Nml Nml Nml Nml Nml Nml Nml Nml N/A  Right Abd Poll Brev Nml Nml Nml *2- *1+ *1+ *1+ Nml N/A  Right PronatorTeres Nml Nml Nml Nml Nml Nml Nml Nml N/A  Right Biceps Nml Nml Nml Nml Nml Nml Nml Nml N/A  Right Triceps Nml Nml Nml Nml Nml Nml Nml Nml N/A  Right Deltoid Nml Nml Nml Nml Nml Nml Nml Nml N/A  Right AntTibialis Nml Nml Nml *1- *1+ *1+ *1+ Nml N/A  Right Gastroc Nml Nml Nml Nml Nml Nml Nml Nml N/A  Right Flex Dig Long Nml Nml Nml *1- *1+ *1+ *1+ Nml N/A  Right RectFemoris Nml Nml Nml Nml Nml Nml Nml Nml N/A  Right BicepsFemS Nml Nml Nml Nml Nml Nml Nml Nml N/A  Right GluteusMed Nml Nml Nml Nml Nml Nml Nml Nml N/A      Waveforms:

## 2022-03-27 NOTE — Progress Notes (Signed)
LMOVM for patient to call the office back.

## 2022-03-28 ENCOUNTER — Telehealth: Payer: Self-pay

## 2022-03-28 ENCOUNTER — Other Ambulatory Visit: Payer: Self-pay | Admitting: Podiatry

## 2022-03-28 ENCOUNTER — Telehealth: Payer: Self-pay | Admitting: Neurology

## 2022-03-28 DIAGNOSIS — G5600 Carpal tunnel syndrome, unspecified upper limb: Secondary | ICD-10-CM

## 2022-03-28 NOTE — Telephone Encounter (Signed)
See results encounter

## 2022-03-28 NOTE — Telephone Encounter (Addendum)
Patient returned call after hours about his results.

## 2022-03-28 NOTE — Telephone Encounter (Signed)
-----   Message from Pieter Partridge, DO sent at 03/24/2022  3:31 PM EST ----- The nerve test does show evidence of severe carpal tunnel syndrome.  If agreeable, we can send him to a hand specialist for further evaluation and treatment

## 2022-03-28 NOTE — Telephone Encounter (Signed)
Patient advised of his results. Patient agrees to the referral to hand specialist.    Referral sent to [facility].

## 2022-04-11 DIAGNOSIS — G4733 Obstructive sleep apnea (adult) (pediatric): Secondary | ICD-10-CM | POA: Diagnosis not present

## 2022-05-11 DIAGNOSIS — G4733 Obstructive sleep apnea (adult) (pediatric): Secondary | ICD-10-CM | POA: Diagnosis not present

## 2022-06-08 NOTE — Progress Notes (Signed)
NEUROLOGY FOLLOW UP OFFICE NOTE  [facility] [id number]  Assessment/Plan:   Probable small fiber polyneuropathy Bilateral carpal tunnel syndrome     1  Pregablin '25mg'$  twice daily 2  Check labs for other potential causes of neuropathy:  ANA with ENA, IFE, ACE, B1, B6, B12 3  If he desires, I advised calling the hand specialist's office to reschedule 4  Follow up 6 months.     Subjective:  GATSBY CHISMAR is a 77 year old right-handed male with HTN, HLD, arthritis and OSA who follows up for neuropathy.  He is accompanied by his daughter who also provides history.  UPDATE: Current medications: pregablin '25mg'$  BID.  Started pregablin for neuralgia.  Also using a cream (does not remember name)   on the feet which helps.  NCV-EMG of right upper and lower extremities on 03/23/2022 personally reviewed revealed evidence of severe right carpal tunnel syndrome, as well as moderate right ulnar neuropathy across the elbow, and chronic right L5 radiculopathy but no evidence of large fiber sensorimotor polyneuropathy.  He was referred to a hand specialist but he had to cancel appointment because he had to go out of town.     HISTORY: Patient reports bilateral foot pain for past 6 months.  Involves bottom of both feet at the arches.  A burning sensation.  Denies back pain or radicular pain down the legs.  Usually bothersome when on his feet but not laying down.  He has arthritis and onychomycosis of toenails in both feet contributing to pain as well.  He also reports bilateral hand numbness and pain for about a year.  Numbness and tingling and sharp pain involves all fingers except for thumb.  It happens throughout the day but also states it can wake him up at night.  No neck pain.  Wears gloves for arthritic pain which does not help.  Symptoms in the feet overall stable.  Symptoms in the hands have slightly worsened over the year.  Hgb A1c from May 2023 was 5.8 and TSH was 1.40.   Past  medications:  gabapentin.  PAST MEDICAL HISTORY: Past Medical History:  Diagnosis Date   Arthritis    Cataract    right eye removed    Complication of anesthesia    "hard to wake up post op" per pt and wife    GERD (gastroesophageal reflux disease)    Hyperlipidemia    Hypertension    Neuromuscular disorder ([place])    pinched nerve in foot   Sleep apnea    wears cpap     MEDICATIONS: Current Outpatient Medications on File Prior to Visit  Medication Sig Dispense Refill   amLODipine (NORVASC) 10 MG tablet Take 1 tablet (10 mg total) by mouth daily. 90 tablet 3   aspirin 81 MG tablet Take 81 mg by mouth daily.     hydrochlorothiazide (HYDRODIURIL) 25 MG tablet TAKE 1 TABLET BY MOUTH EVERY DAY 90 tablet 1   ketoconazole (NIZORAL) 2 % cream APPLY 1 APPLICATION TOPICALLY DAILY 60 g 0   lidocaine (LIDODERM) 5 % Place 1 patch onto the skin daily. Remove & Discard patch within 12 hours or as directed by MD 30 patch 0   losartan (COZAAR) 50 MG tablet Take 1 tablet (50 mg total) by mouth daily. 90 tablet 3   omeprazole (PRILOSEC) 40 MG capsule Take 1 capsule (40 mg total) by mouth daily. 90 capsule 3   pregabalin (LYRICA) 25 MG capsule Take 1 capsule (25 mg  total) by mouth 2 (two) times daily. 60 capsule 5   simvastatin (ZOCOR) 10 MG tablet TAKE 1 TABLET BY MOUTH EVERYDAY AT BEDTIME 90 tablet 3   No current facility-administered medications on file prior to visit.    ALLERGIES: Allergies  Allergen Reactions   Lisinopril Cough    FAMILY HISTORY: Family History  Problem Relation Age of Onset   Diabetes Sister    Stroke Sister    Colon cancer Neg Hx    Colon polyps Neg Hx    Esophageal cancer Neg Hx    Rectal cancer Neg Hx    Stomach cancer Neg Hx       Objective:  Blood pressure (!) 140/60, pulse 86, height '5\' 9"'$  (1.753 m), weight 275 lb 3.2 oz (124.8 kg), SpO2 97 %. General: No acute distress.  Patient appears well-groomed.     Metta Clines, DO  CC: [name],  NP

## 2022-06-10 IMAGING — DX DG CHEST 2V
2 series · 2 of 2 positions shown · non-contrast
Comparison: 01/11/2017

CLINICAL DATA: Chest pain and left arm swelling

EXAM:
CHEST - 2 VIEW

[chest pa]
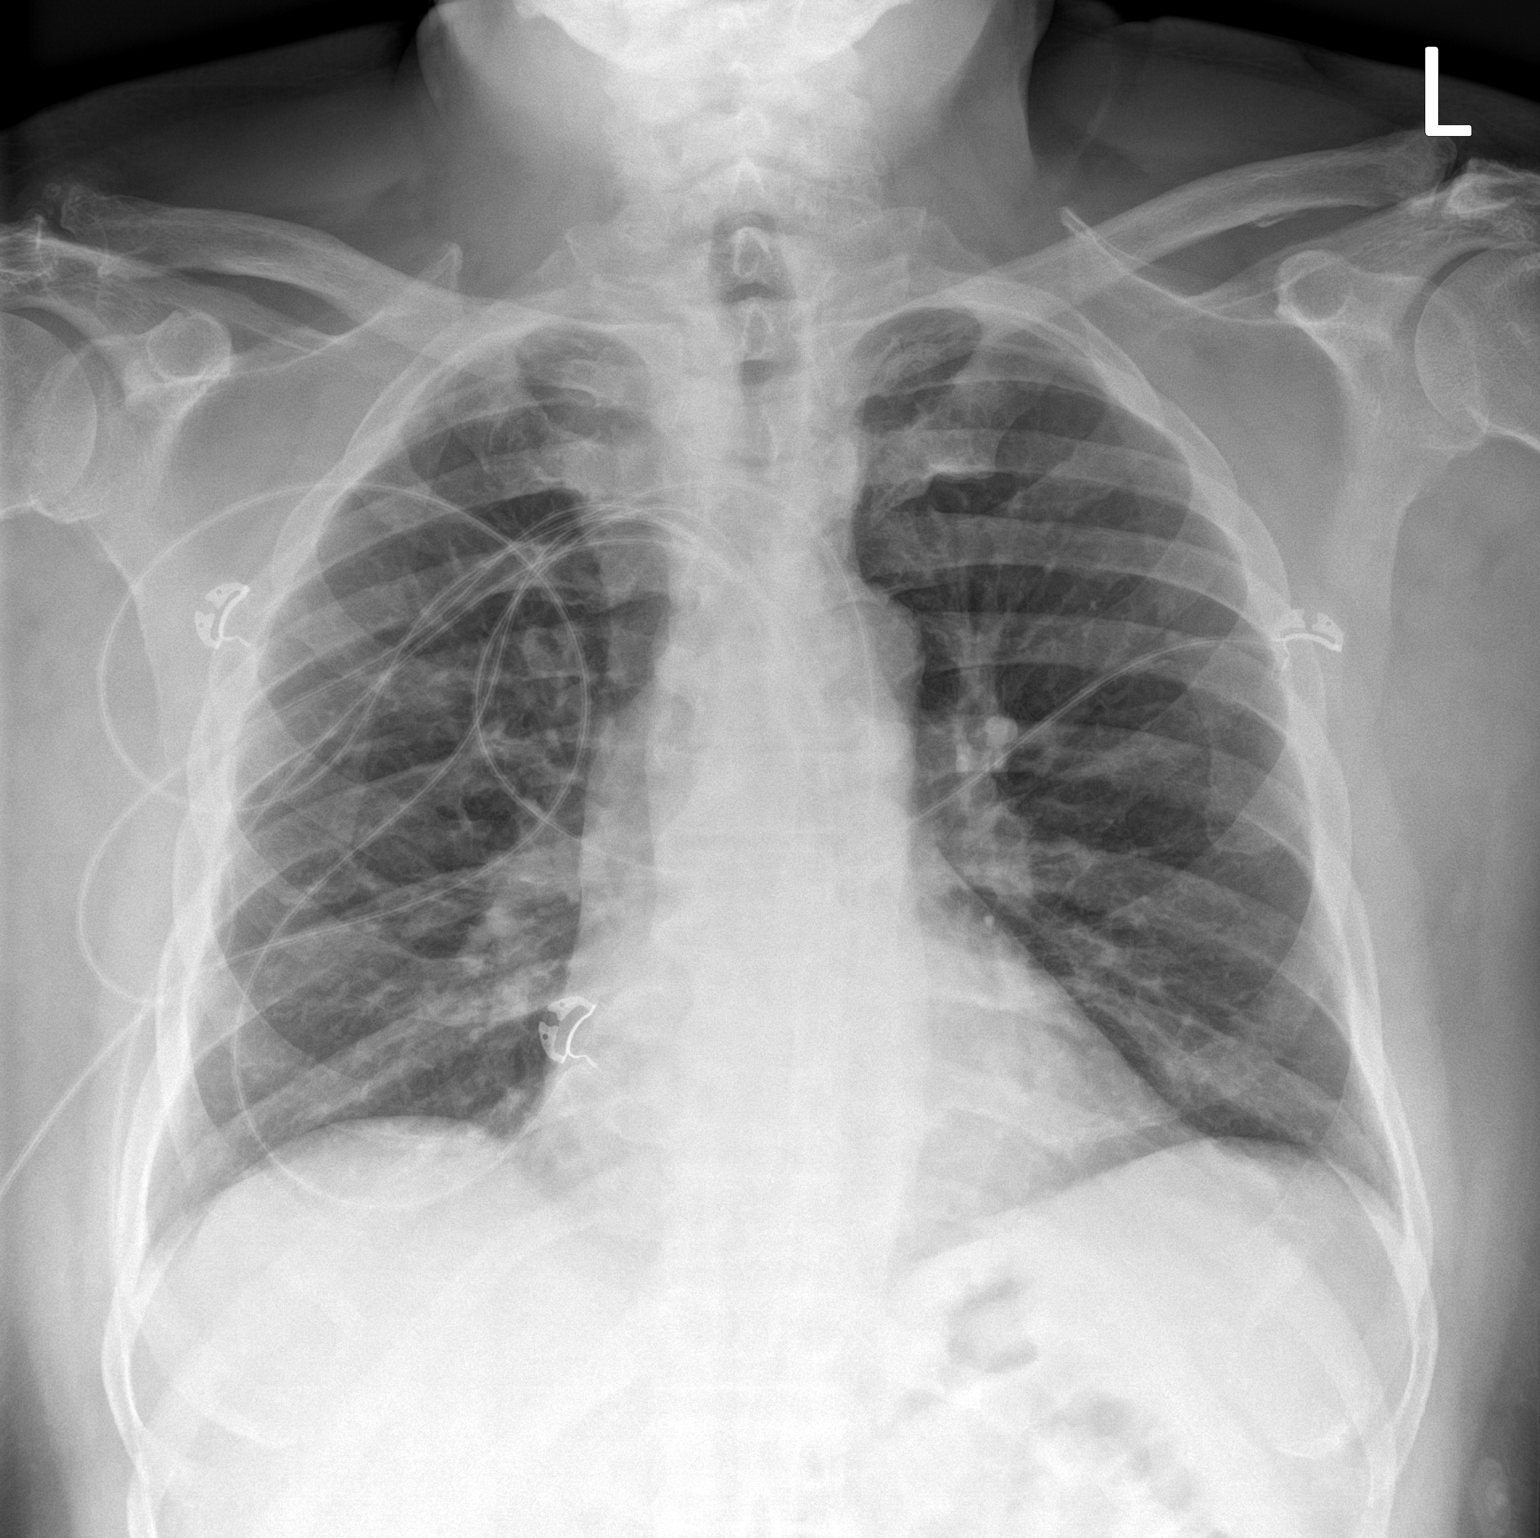

[chest lat]
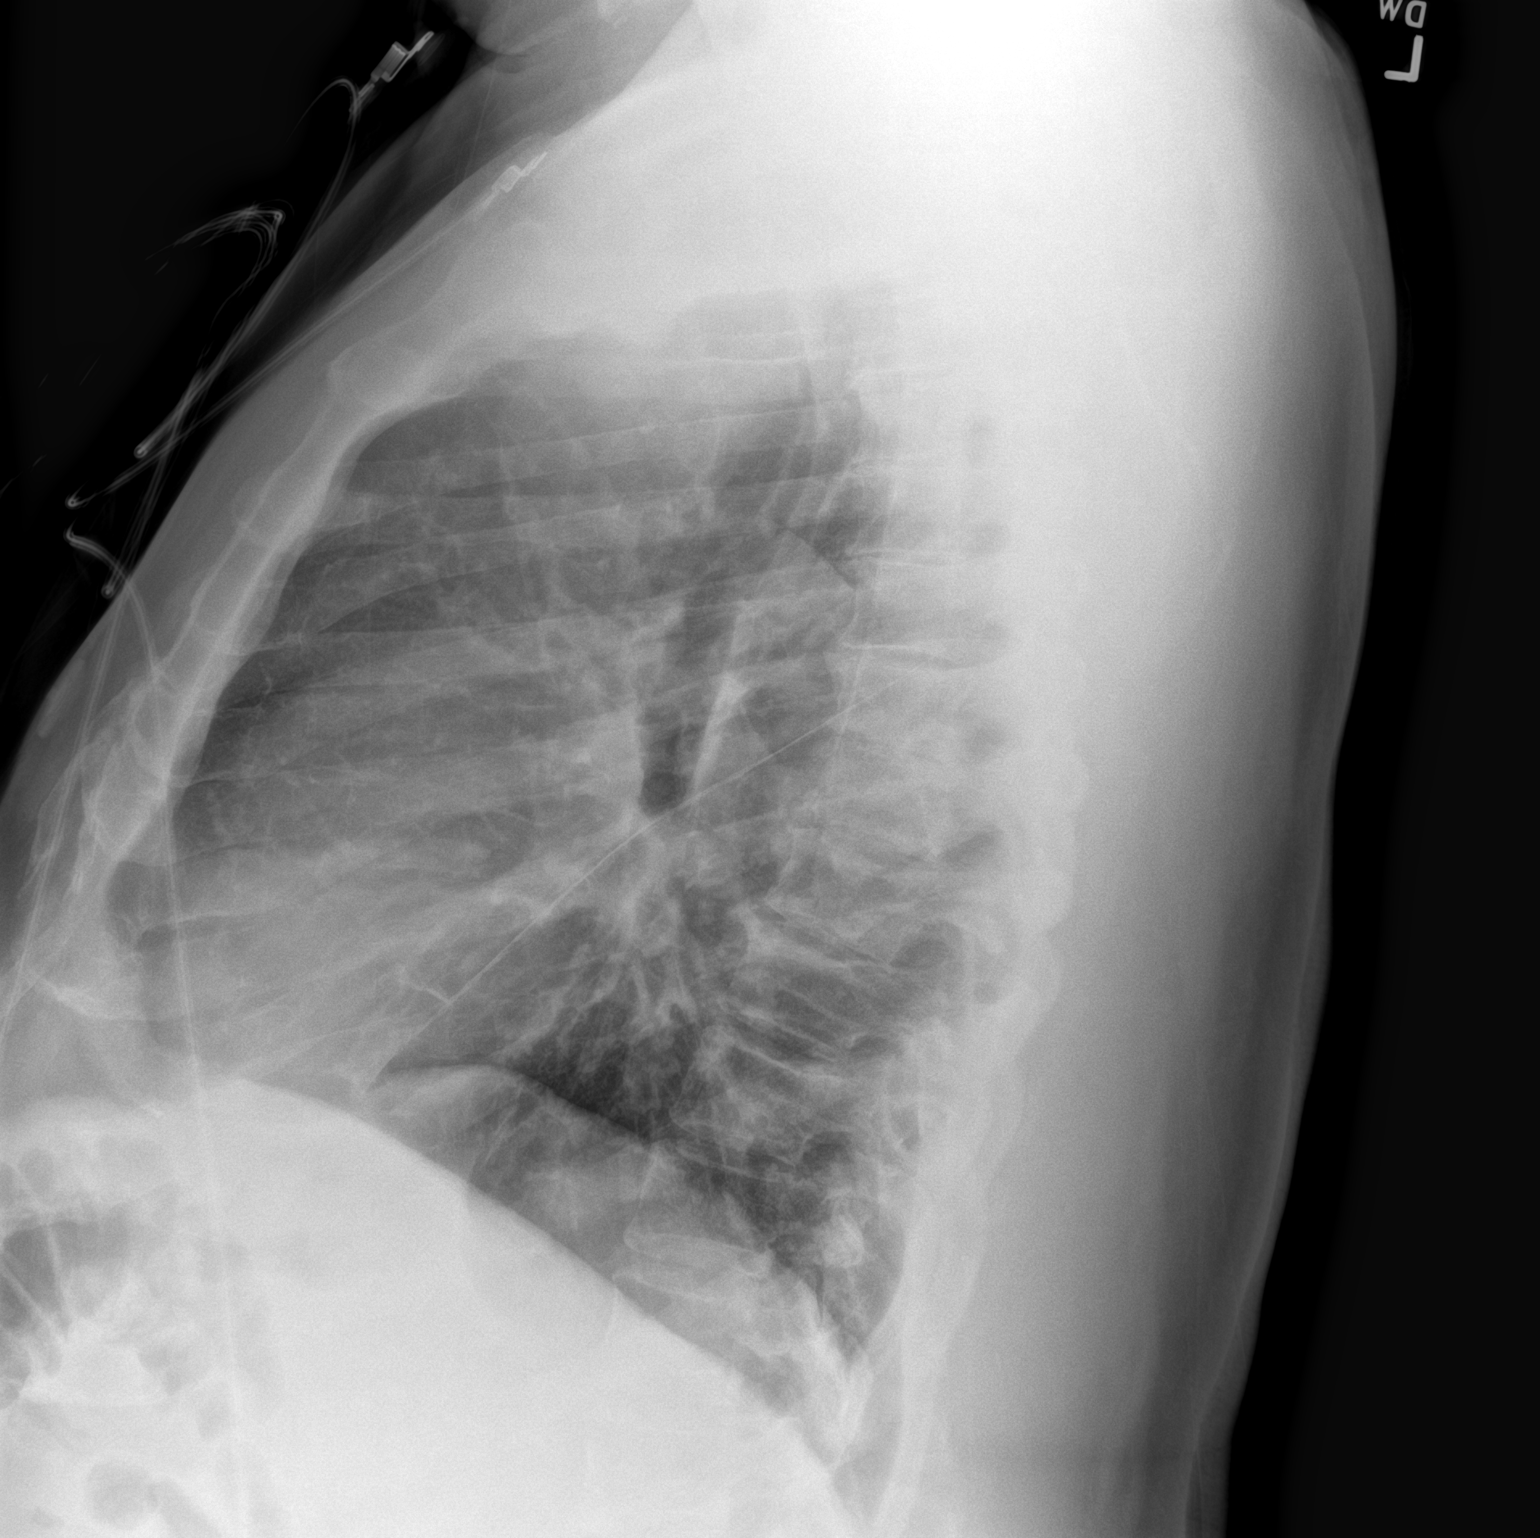

[2 of 2 positions shown; findings below may reference images not displayed]

FINDINGS: The heart size and mediastinal contours are within normal limits.
Both lungs are clear. The visualized skeletal structures are
unremarkable.
IMPRESSION: No active cardiopulmonary disease.

## 2022-06-11 DIAGNOSIS — G4733 Obstructive sleep apnea (adult) (pediatric): Secondary | ICD-10-CM | POA: Diagnosis not present

## 2022-06-12 ENCOUNTER — Ambulatory Visit: Payer: Medicare HMO | Admitting: Neurology

## 2022-06-12 ENCOUNTER — Encounter: Payer: Self-pay | Admitting: Neurology

## 2022-06-12 ENCOUNTER — Ambulatory Visit: Payer: Medicare HMO | Admitting: Podiatry

## 2022-06-12 ENCOUNTER — Other Ambulatory Visit (INDEPENDENT_AMBULATORY_CARE_PROVIDER_SITE_OTHER): Payer: Medicare HMO

## 2022-06-12 VITALS — BP 140/60 | HR 86 | Ht 69.0 in | Wt 275.2 lb

## 2022-06-12 DIAGNOSIS — B351 Tinea unguium: Secondary | ICD-10-CM

## 2022-06-12 DIAGNOSIS — M79675 Pain in left toe(s): Secondary | ICD-10-CM | POA: Diagnosis not present

## 2022-06-12 DIAGNOSIS — G5601 Carpal tunnel syndrome, right upper limb: Secondary | ICD-10-CM

## 2022-06-12 DIAGNOSIS — G629 Polyneuropathy, unspecified: Secondary | ICD-10-CM

## 2022-06-12 DIAGNOSIS — M79674 Pain in right toe(s): Secondary | ICD-10-CM

## 2022-06-12 LAB — VITAMIN B12: Vitamin B-12: 245 pg/mL (ref 211–911)

## 2022-06-12 MED ORDER — PREGABALIN 25 MG PO CAPS: 25.0000 mg | ORAL_CAPSULE | Freq: Two times a day (BID) | ORAL | 5 refills | Status: DC

## 2022-06-12 MED ORDER — KETOCONAZOLE 2 % EX CREA: 1.0000 | TOPICAL_CREAM | Freq: Every day | CUTANEOUS | 0 refills | Status: DC

## 2022-06-12 NOTE — Patient Instructions (Addendum)
Continue pregablin '25mg'$  twice daily Check labs:  ANA with ENA, IFE, ACE, B1, B6, B12 Contact the hand specialist's office about rescheduling, if you desire. Follow up 6 months.

## 2022-06-12 NOTE — Progress Notes (Signed)
Subjective: No chief complaint on file.   77 y.o. returns the office today for painful, elongated, thickened toenails which he cannot trim himself. Denies any redness or drainage around the nails.  The stiffness that he was experiencing to the feet is still present but not as bad and overall doing well he reports.  Asking for refill of the cream as prescribed previously as it was helpful.  PCP: [name], NP Last Seen: February 23, 2022  A1c: 5.8 on Sep 27, 2021  Objective: AAO 3, NAD DP/PT pulses palpable, CRT less than 3 seconds Protective sensation decreased with Semmes Weinstein monofilament although mild. Nails hypertrophic, dystrophic, elongated, brittle, discolored 10. There is tenderness overlying the nails 1-5 bilaterally. There is no surrounding erythema or drainage along the nail sites. No open lesions or pre-ulcerative lesions are identified. Dry, peeling skin present interdigitally as well as the sulcus of the toes. No other areas of tenderness bilateral lower extremities. No overlying edema, erythema, increased warmth. No pain with calf compression, swelling, warmth, erythema.  Assessment: Patient presents with symptomatic onychomycosis  Plan: -Treatment options including alternatives, risks, complications were discussed -Nails sharply debrided 10 without complication/bleeding. -Refilled ketoconazole cream as this was helpful to use as needed. -Discussed daily foot inspection. If there are any changes, to call the office immediately.  -Follow-up in 3 months or sooner if any problems are to arise. In the meantime, encouraged to call the office with any questions, concerns, changes symptoms.  Celesta Gentile, DPM

## 2022-06-13 ENCOUNTER — Telehealth: Payer: Self-pay

## 2022-06-13 NOTE — Telephone Encounter (Signed)
PA request recevied from [facility], Pregablin PA Needed

## 2022-06-14 ENCOUNTER — Other Ambulatory Visit: Payer: Self-pay | Admitting: Adult Health

## 2022-06-14 DIAGNOSIS — I10 Essential (primary) hypertension: Secondary | ICD-10-CM

## 2022-06-14 DIAGNOSIS — Z760 Encounter for issue of repeat prescription: Secondary | ICD-10-CM

## 2022-06-15 LAB — VITAMIN B6: Vitamin B6: 4.6 ng/mL (ref 2.1–21.7)

## 2022-06-15 LAB — VITAMIN B1: Vitamin B1 (Thiamine): 13 nmol/L (ref 8–30)

## 2022-06-15 LAB — ANGIOTENSIN CONVERTING ENZYME: Angiotensin-Converting Enzyme: 47 U/L (ref 9–67)

## 2022-06-16 LAB — IMMUNOFIXATION, SERUM
IgA/Immunoglobulin A, Serum: 665 mg/dL — ABNORMAL HIGH (ref 61–437)
IgG (Immunoglobin G), Serum: 1537 mg/dL (ref 603–1613)
IgM (Immunoglobulin M), Srm: 17 mg/dL (ref 15–143)

## 2022-06-16 LAB — ANA+ENA+DNA/DS+SCL 70+SJOSSA/B
ANA Titer 1: NEGATIVE
ENA RNP Ab: <0.2 AI (ref 0.0–0.9)
ENA SM Ab Ser-aCnc: <0.2 AI (ref 0.0–0.9)
ENA SSA (RO) Ab: <0.2 AI (ref 0.0–0.9)
ENA SSB (LA) Ab: <0.2 AI (ref 0.0–0.9)
Scleroderma (Scl-70) (ENA) Antibody, IgG: <0.2 AI (ref 0.0–0.9)
dsDNA Ab: <1 IU/mL (ref 0–9)

## 2022-06-19 NOTE — Telephone Encounter (Signed)
Patient Advocate Encounter   Received notification that prior authorization for Pregabalin '25MG'$  capsules is required.   PA submitted on 06/19/2022 Key [id number] Status is pending       Lyndel Safe, [place] Patient Advocate Specialist [place] Patient Advocate Team Direct Number: [phone]  Fax: [phone]

## 2022-06-20 NOTE — Telephone Encounter (Signed)
PA has been APPROVED from 06/19/2022-05/15/2023.

## 2022-06-20 NOTE — Progress Notes (Signed)
Patient advised of his lab results and the recommendation to start B12.

## 2022-07-11 DIAGNOSIS — G4733 Obstructive sleep apnea (adult) (pediatric): Secondary | ICD-10-CM | POA: Diagnosis not present

## 2022-08-09 DIAGNOSIS — G4733 Obstructive sleep apnea (adult) (pediatric): Secondary | ICD-10-CM | POA: Diagnosis not present

## 2022-08-14 DIAGNOSIS — G4733 Obstructive sleep apnea (adult) (pediatric): Secondary | ICD-10-CM | POA: Diagnosis not present

## 2022-08-26 ENCOUNTER — Other Ambulatory Visit: Payer: Self-pay | Admitting: Adult Health

## 2022-08-26 DIAGNOSIS — E785 Hyperlipidemia, unspecified: Secondary | ICD-10-CM

## 2022-08-26 DIAGNOSIS — I10 Essential (primary) hypertension: Secondary | ICD-10-CM

## 2022-08-26 DIAGNOSIS — Z760 Encounter for issue of repeat prescription: Secondary | ICD-10-CM

## 2022-09-01 ENCOUNTER — Ambulatory Visit (INDEPENDENT_AMBULATORY_CARE_PROVIDER_SITE_OTHER): Payer: Medicare HMO | Admitting: Adult Health

## 2022-09-01 ENCOUNTER — Encounter: Payer: Self-pay | Admitting: Adult Health

## 2022-09-01 VITALS — BP 140/70 | HR 75 | Temp 97.5°F | Ht 68.5 in | Wt 272.0 lb

## 2022-09-01 DIAGNOSIS — Z0000 Encounter for general adult medical examination without abnormal findings: Secondary | ICD-10-CM

## 2022-09-01 NOTE — Patient Instructions (Signed)
Work on weight loss through diet and exercise Follow up for CPE next month

## 2022-09-01 NOTE — Progress Notes (Signed)
Medicare Annual Preventive Care Visit  (initial annual wellness or annual wellness exam)  Concerns and/or follow up today:  See HM section in Epic for other details of completed HM. See scanned documentation under Media Tab for further documentation HPI, health risk assessment. See Media Tab and Care Teams sections in Epic for other providers.  ROS:   1.) Patient-completed health risk assessment  - completed and reviewed, see scanned documentation in media tab.   2.) Review of Medical History: -PMH, PSH, Family History and current specialty and care providers reviewed and updated and listed below  - see scanned in document in chart and below  Past Medical History:  Diagnosis Date   Arthritis    Cataract    right eye removed    Complication of anesthesia    "hard to wake up post op" per pt and wife    GERD (gastroesophageal reflux disease)    Hyperlipidemia    Hypertension    Neuromuscular disorder    pinched nerve in foot   Sleep apnea    wears cpap     Past Surgical History:  Procedure Laterality Date   CATARACT EXTRACTION Right    COLONOSCOPY     JOINT REPLACEMENT     bilateral knee replacenent   TOTAL KNEE REVISION  05/30/2011   Procedure: TOTAL KNEE REVISION;  Surgeon: Cammy Copa, MD;  Location: [facility] OR;  Service: Orthopedics;  Laterality: Right;  Right total knee arthroplasty polyethylene spacer exchange    Social History   Socioeconomic History   Marital status: Married    Spouse name: Not on file   Number of children: 2   Years of education: 12   Highest education level: High school graduate  Occupational History   Occupation: retired  Tobacco Use   Smoking status: Former    Types: Cigarettes    Quit date: 02/28/1966    Years since quitting: 56.5   Smokeless tobacco: Never  Vaping Use   Vaping Use: Never used  Substance and Sexual Activity   Alcohol use: Yes    Comment: holidays   Drug use: No   Sexual activity: Yes  Other Topics  Concern   Not on file  Social History Narrative   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   Two children who live in [place]   Married; [facility] 2    Right handed   Social Determinants of Health   Financial Resource Strain: Low Risk  (09/01/2022)   Overall Financial Resource Strain (CARDIA)    Difficulty of Paying Living Expenses: Not hard at all  Food Insecurity: No Food Insecurity (09/01/2022)   Hunger Vital Sign    Worried About Running Out of Food in the Last Year: Never true    Ran Out of Food in the Last Year: Never true  Transportation Needs: No Transportation Needs (09/01/2022)   PRAPARE - Administrator, Civil Service (Medical): No    Lack of Transportation (Non-Medical): No  Physical Activity: Insufficiently Active (09/01/2022)   Exercise Vital Sign    Days of Exercise per Week: 3 days    Minutes of Exercise per Session: 30 min  Stress: No Stress Concern Present (09/01/2022)   [employer] of Occupational Health - Occupational Stress Questionnaire    Feeling of Stress : Not at all  Social Connections: Socially Integrated (09/01/2022)   Social Connection and Isolation Panel [NHANES]    Frequency of Communication with Friends and Family: More  than three times a week    Frequency of Social Gatherings with Friends and Family: Three times a week    Attends Religious Services: More than 4 times per year    Active Member of Clubs or Organizations: Yes    Attends Banker Meetings: 1 to 4 times per year    Marital Status: Married  Catering manager Violence: Not on file    Family History  Problem Relation Age of Onset   Diabetes Sister    Stroke Sister    Colon cancer Neg Hx    Colon polyps Neg Hx    Esophageal cancer Neg Hx    Rectal cancer Neg Hx    Stomach cancer Neg Hx     Current Outpatient Medications on File Prior to Visit  Medication Sig Dispense Refill   amLODipine (NORVASC) 10 MG tablet Take 1 tablet (10 mg total)  by mouth daily. 90 tablet 3   aspirin 81 MG tablet Take 81 mg by mouth daily.     hydrochlorothiazide (HYDRODIURIL) 25 MG tablet TAKE 1 TABLET BY MOUTH EVERY DAY 90 tablet 0   ketoconazole (NIZORAL) 2 % cream Apply 1 Application topically daily. 60 g 0   lidocaine (LIDODERM) 5 % Place 1 patch onto the skin daily. Remove & Discard patch within 12 hours or as directed by MD 30 patch 0   losartan (COZAAR) 50 MG tablet Take 1 tablet (50 mg total) by mouth daily. 90 tablet 3   omeprazole (PRILOSEC) 40 MG capsule Take 1 capsule (40 mg total) by mouth daily. 90 capsule 3   pregabalin (LYRICA) 25 MG capsule Take 1 capsule (25 mg total) by mouth 2 (two) times daily. 60 capsule 5   simvastatin (ZOCOR) 10 MG tablet TAKE 1 TABLET BY MOUTH EVERYDAY AT BEDTIME 90 tablet 3   No current facility-administered medications on file prior to visit.     3.) Review of functional ability and level of safety:  Any difficulty hearing?  See scanned documentation  History of falling?  See scanned documentation  Any trouble with IADLs - using a phone, using transportation, grocery shopping, preparing meals, doing housework, doing laundry, taking medications and managing money?  See scanned documentation  Advance Directives?  Discussed briefly and offered more resources and detailed discussion with our trained staff.   See summary of recommendations in Patient Instructions below.  4.) Physical Exam  Vitals:   09/01/22 1003  BP: (!) 140/70  Pulse: 75  Temp: (!) 97.5 F (36.4 C)  SpO2: 98%   Estimated body mass index is 40.76 kg/m as calculated from the following:   Height as of this encounter: 5' 8.5" (1.74 m).   Weight as of this encounter: 272 lb (123.4 kg).  EKG (optional): deferred  General: alert, appear well hydrated and in no acute distress  HEENT: visual acuity grossly intact  CV: HRRR  Lungs: CTA bilaterally  Psych: pleasant and cooperative, no obvious depression or  anxiety  Cognitive function grossly intact  See patient instructions for recommendations.  Education and counseling regarding the above review of health provided with a plan for the following: -see scanned patient completed form for further details -fall prevention strategies discussed  -healthy lifestyle discussed -importance and resources for completing advanced directives discussed -see patient instructions below for any other recommendations provided  4)The following written screening schedule of preventive measures were reviewed with assessment and plan made per below, orders and patient instructions:      AAA screening  done if applicable     Alcohol screening done     Obesity Screening and counseling done     STI screening (Hep C if born [birth year]) offered and per pt wishes     Tobacco Screening done done       Pneumococcal (PPSV23 -one dose after 65, one before if risk factors), influenza yearly and hepatitis B vaccines (if high risk - end stage renal disease, IV drugs, homosexual men, live in home for mentally retarded, hemophilia receiving factors) ASSESSMENT/PLAN: done if applicable      Screening mammograph (yearly if >40) ASSESSMENT/PLAN: utd or ordered      Screening Pap smear/pelvic exam (q2 years) ASSESSMENT/PLAN: n/a, declined      Prostate cancer screening ASSESSMENT/PLAN: n/a, declined      Colorectal cancer screening (FOBT yearly or flex sig q4y or colonoscopy q10y or barium enema q4y) ASSESSMENT/PLAN: utd or ordered      Diabetes outpatient self-management training services ASSESSMENT/PLAN: utd or done      Bone mass measurements(covered q2y if indicated - estrogen def, osteoporosis, hyperparathyroid, vertebral abnormalities, osteoporosis or steroids) ASSESSMENT/PLAN: utd or discussed and ordered per pt wishes      Screening for glaucoma(q1y if high risk - diabetes, FH, AA and > 50 or hispanic and > 65) ASSESSMENT/PLAN: utd or advised      Medical  nutritional therapy for individuals with diabetes or renal disease ASSESSMENT/PLAN: see orders      Cardiovascular screening blood tests (lipids q5y) ASSESSMENT/PLAN: see orders and labs      Diabetes screening tests ASSESSMENT/PLAN: see orders and labs   7.) Summary: -risk factors and conditions per above assessment were discussed and treatment, recommendations and referrals were offered per documentation above and orders and patient instructions.  Encounter for Medicare annual wellness exam  Patient Instructions  Work on weight loss through diet and exercise Follow up for CPE next month   [name], NP

## 2022-09-09 DIAGNOSIS — G4733 Obstructive sleep apnea (adult) (pediatric): Secondary | ICD-10-CM | POA: Diagnosis not present

## 2022-09-11 ENCOUNTER — Ambulatory Visit: Payer: Medicare HMO | Admitting: Podiatry

## 2022-09-11 DIAGNOSIS — M79675 Pain in left toe(s): Secondary | ICD-10-CM

## 2022-09-11 DIAGNOSIS — M79674 Pain in right toe(s): Secondary | ICD-10-CM | POA: Diagnosis not present

## 2022-09-11 DIAGNOSIS — B351 Tinea unguium: Secondary | ICD-10-CM | POA: Diagnosis not present

## 2022-09-11 DIAGNOSIS — B353 Tinea pedis: Secondary | ICD-10-CM

## 2022-09-11 MED ORDER — CICLOPIROX 8 % EX SOLN: Freq: Every day | CUTANEOUS | 2 refills | Status: DC

## 2022-09-11 MED ORDER — CLOTRIMAZOLE-BETAMETHASONE 1-0.05 % EX CREA: 1.0000 | TOPICAL_CREAM | Freq: Two times a day (BID) | CUTANEOUS | 2 refills | Status: DC

## 2022-09-11 NOTE — Patient Instructions (Signed)
Ciclopirox Topical Solution What is this medication? CICLOPIROX (sye kloe PEER ox) treats fungal infections of the nails. It belongs to a group of medications called antifungals. It will not treat infections caused by bacteria or viruses. This medicine may be used for other purposes; ask your health care provider or pharmacist if you have questions. COMMON BRAND NAME(S): Ciclodan Nail Solution, CNL8, Penlac What should I tell my care team before I take this medication? They need to know if you have any of these conditions: Diabetes (high blood sugar) Immune system problems Organ transplant Receiving steroid inhalers, cream, or lotion Seizures Tingling of the fingers or toes or other nerve disorder An unusual or allergic reaction to ciclopirox, other medications, foods, dyes, or preservatives Pregnant or trying to get pregnant Breast-feeding How should I use this medication? This medication is for external use only. Do not take by mouth. Wash your hands before and after use. If you are treating your hands, only wash your hands before use. Do not get it in your eyes. If you do, rinse your eyes with plenty of cool tap water. Use it as directed on the prescription label at the same time every day. Do not use it more often than directed. Use the medication for the full course as directed by your care team, even if you think you are better. Do not stop using it unless your care team tells you to stop it early. Apply a thin film of the medication to the affected area. Talk to your care team about the use of this medication in children. While it may be prescribed for children as young as 12 years for selected conditions, precautions do apply. Overdosage: If you think you have taken too much of this medicine contact a poison control center or emergency room at once. NOTE: This medicine is only for you. Do not share this medicine with others. What if I miss a dose? If you miss a dose, use it as soon as  you can. If it is almost time for your next dose, use only that dose. Do not use double or extra doses. What may interact with this medication? Interactions are not expected. Do not use any other skin products without telling your care team. This list may not describe all possible interactions. Give your health care provider a list of all the medicines, herbs, non-prescription drugs, or dietary supplements you use. Also tell them if you smoke, drink alcohol, or use illegal drugs. Some items may interact with your medicine. What should I watch for while using this medication? Visit your care team for regular checks on your progress. It may be some time before you see the benefit from this medication. Do not use nail polish or other nail cosmetic products on the treated nails. Removal of the unattached, infected nail by your care team is needed with use of this medication. If you have diabetes or numbness in your fingers or toes, talk to your care team about proper nail care. What side effects may I notice from receiving this medication? Side effects that you should report to your care team as soon as possible: Allergic reactions--skin rash, itching, hives, swelling of the face, lips, tongue, or throat Burning, itching, crusting, or peeling of treated skin Side effects that usually do not require medical attention (report to your care team if they continue or are bothersome): Change in nail shape, thickness, or color Mild skin irritation, redness, or dryness This list may not describe all possible side   effects. Call your doctor for medical advice about side effects. You may report side effects to FDA at 1-800-FDA-1088. Where should I keep my medication? Keep out of the reach of children and pets. Store at room temperature between 20 and 25 degrees C (68 and 77 degrees F). This medication is flammable. Avoid exposure to heat, fire, flame, and smoking. Get rid of medications that are no longer needed  or have expired: Take the medication to a medication take-back program. Check with your pharmacy or law enforcement to find a location. If you cannot return the medication, check the label or package insert to see if the medication should be thrown out in the garbage or flushed down the toilet. If you are not sure, ask your care team. If it is safe to put in the trash, take the medication out of the container. Mix the medication with cat litter, dirt, coffee grounds, or other unwanted substance. Seal the mixture in a bag or container. Put it in the trash. NOTE: This sheet is a summary. It may not cover all possible information. If you have questions about this medicine, talk to your doctor, pharmacist, or health care provider.  2023 Elsevier/Gold Standard (2021-04-12 00:00:00)  

## 2022-09-11 NOTE — Progress Notes (Signed)
Subjective: Chief Complaint  Patient presents with   Nail Problem    Thick painful toenails, 3 month follow up    77 y.o. returns the office today for painful, elongated, thickened toenails which he cannot trim himself. Denies any redness or drainage around the nails.  He states that the fungus is about the same.  He has been using ketoconazole cream.  PCP: [name], NP Last Seen: February 23, 2022  A1c: 5.8 on Sep 27, 2021  Objective: AAO 3, NAD DP/PT pulses palpable, CRT less than 3 seconds Protective sensation decreased with Semmes Weinstein monofilament although mild. Nails hypertrophic, dystrophic, elongated, brittle, discolored 10. There is tenderness overlying the nails 1-5 bilaterally. There is no surrounding erythema or drainage along the nail sites. No open lesions or pre-ulcerative lesions are identified. Dry, peeling skin present interdigitally as well as the sulcus of the toes.  Somewhat on the bottom of the foot as well.  There is no pustules, open sores. No other areas of tenderness bilateral lower extremities. No overlying edema, erythema, increased warmth. No pain with calf compression, swelling, warmth, erythema.  Assessment: Patient presents with symptomatic onychomycosis, tinea pedis  Plan: -Treatment options including alternatives, risks, complications were discussed -Nails sharply debrided 10 without complication/bleeding. -Discussed oral, topical treatment.  He wants to continue with topical. -Prescribed Lotrisone for the skin -Penlac for the nails -Discussed daily foot inspection. If there are any changes, to call the office immediately.  -Follow-up in 3 months or sooner if any problems are to arise. In the meantime, encouraged to call the office with any questions, concerns, changes symptoms.  Ovid Curd, DPM

## 2022-09-13 DIAGNOSIS — G4733 Obstructive sleep apnea (adult) (pediatric): Secondary | ICD-10-CM | POA: Diagnosis not present

## 2022-09-29 ENCOUNTER — Encounter: Payer: Self-pay | Admitting: Adult Health

## 2022-09-29 ENCOUNTER — Ambulatory Visit (INDEPENDENT_AMBULATORY_CARE_PROVIDER_SITE_OTHER): Payer: Medicare HMO | Admitting: Adult Health

## 2022-09-29 VITALS — BP 122/82 | HR 72 | Temp 97.4°F | Ht 68.0 in | Wt 263.0 lb

## 2022-09-29 DIAGNOSIS — G4733 Obstructive sleep apnea (adult) (pediatric): Secondary | ICD-10-CM | POA: Diagnosis not present

## 2022-09-29 DIAGNOSIS — Z0000 Encounter for general adult medical examination without abnormal findings: Secondary | ICD-10-CM | POA: Diagnosis not present

## 2022-09-29 DIAGNOSIS — G629 Polyneuropathy, unspecified: Secondary | ICD-10-CM

## 2022-09-29 DIAGNOSIS — K219 Gastro-esophageal reflux disease without esophagitis: Secondary | ICD-10-CM | POA: Diagnosis not present

## 2022-09-29 DIAGNOSIS — Z125 Encounter for screening for malignant neoplasm of prostate: Secondary | ICD-10-CM | POA: Diagnosis not present

## 2022-09-29 DIAGNOSIS — I10 Essential (primary) hypertension: Secondary | ICD-10-CM

## 2022-09-29 DIAGNOSIS — E785 Hyperlipidemia, unspecified: Secondary | ICD-10-CM | POA: Diagnosis not present

## 2022-09-29 LAB — CBC WITH DIFFERENTIAL/PLATELET
Basophils Absolute: 0.1 10*3/uL (ref 0.0–0.1)
Basophils Relative: 1.4 % (ref 0.0–3.0)
Eosinophils Absolute: 0.1 10*3/uL (ref 0.0–0.7)
Eosinophils Relative: 2.4 % (ref 0.0–5.0)
HCT: 38.4 % — ABNORMAL LOW (ref 39.0–52.0)
Hemoglobin: 13.3 g/dL (ref 13.0–17.0)
Lymphocytes Relative: 48.6 % — ABNORMAL HIGH (ref 12.0–46.0)
Lymphs Abs: 2 10*3/uL (ref 0.7–4.0)
MCHC: 34.6 g/dL (ref 30.0–36.0)
MCV: 90 fl (ref 78.0–100.0)
Monocytes Absolute: 0.5 10*3/uL (ref 0.1–1.0)
Monocytes Relative: 11.3 % (ref 3.0–12.0)
Neutro Abs: 1.5 10*3/uL (ref 1.4–7.7)
Neutrophils Relative %: 36.3 % — ABNORMAL LOW (ref 43.0–77.0)
Platelets: 271 10*3/uL (ref 150.0–400.0)
RBC: 4.27 Mil/uL (ref 4.22–5.81)
RDW: 12.1 % (ref 11.5–15.5)
WBC: 4 10*3/uL (ref 4.0–10.5)

## 2022-09-29 LAB — COMPREHENSIVE METABOLIC PANEL
ALT: 15 U/L (ref 0–53)
AST: 19 U/L (ref 0–37)
Albumin: 4 g/dL (ref 3.5–5.2)
Alkaline Phosphatase: 86 U/L (ref 39–117)
BUN: 10 mg/dL (ref 6–23)
CO2: 27 mEq/L (ref 19–32)
Calcium: 9.8 mg/dL (ref 8.4–10.5)
Chloride: 101 mEq/L (ref 96–112)
Creatinine, Ser: 0.9 mg/dL (ref 0.40–1.50)
GFR: 82.35 mL/min (ref >60.00)
Glucose, Bld: 129 mg/dL — ABNORMAL HIGH (ref 70–99)
Potassium: 3.8 mEq/L (ref 3.5–5.1)
Sodium: 136 mEq/L (ref 135–145)
Total Bilirubin: 0.8 mg/dL (ref 0.2–1.2)
Total Protein: 7.5 g/dL (ref 6.0–8.3)

## 2022-09-29 LAB — LIPID PANEL
Cholesterol: 144 mg/dL (ref 0–200)
HDL: 35.9 mg/dL — ABNORMAL LOW (ref >39.00)
LDL Cholesterol: 97 mg/dL (ref 0–99)
NonHDL: 108.4
Total CHOL/HDL Ratio: 4
Triglycerides: 58 mg/dL (ref 0.0–149.0)
VLDL: 11.6 mg/dL (ref 0.0–40.0)

## 2022-09-29 LAB — PSA: PSA: 3.98 ng/mL (ref 0.10–4.00)

## 2022-09-29 LAB — TSH: TSH: 1.59 u[IU]/mL (ref 0.35–5.50)

## 2022-09-29 MED ORDER — AMLODIPINE BESYLATE 10 MG PO TABS
10.0000 mg | ORAL_TABLET | Freq: Every day | ORAL | 3 refills | Status: DC
Start: 2022-09-29 — End: 2023-12-11

## 2022-09-29 MED ORDER — LOSARTAN POTASSIUM 50 MG PO TABS
50.0000 mg | ORAL_TABLET | Freq: Every day | ORAL | 3 refills | Status: DC
Start: 2022-09-29 — End: 2024-01-02

## 2022-09-29 MED ORDER — OMEPRAZOLE 40 MG PO CPDR
40.0000 mg | DELAYED_RELEASE_CAPSULE | Freq: Every day | ORAL | 3 refills | Status: DC
Start: 2022-09-29 — End: 2024-01-02

## 2022-09-29 NOTE — Progress Notes (Signed)
Subjective:    Patient ID: [name], male    DOB: Nov 22, 1945, 77 y.o.   MRN: [id number]  HPI Patient presents for yearly preventative medicine examination.  He is a pleasant 77 year old male who  has a past medical history of Arthritis, Cataract, Complication of anesthesia, GERD (gastroesophageal reflux disease), Hyperlipidemia, Hypertension, Neuromuscular disorder (HCC), and Sleep apnea.  Hypertension -he is managed with Norvasc 10 mg daily, HCTZ 25 mg daily, and Cozaar 50 mg daily.  Denies shortness of breath, dizziness, lightheadedness, chest pain, or syncopal episodes BP Readings from Last 3 Encounters:  09/29/22 122/82  09/01/22 (!) 140/70  06/12/22 (!) 140/60   Hyperlipidemia-takes Zocor 10 mg daily.  He denies myalgia or fatigue Lab Results  Component Value Date   CHOL 155 09/27/2021   HDL 45.00 09/27/2021   LDLCALC 89 09/27/2021   LDLDIRECT 167.6 07/08/2009   TRIG 109.0 09/27/2021   CHOLHDL 3 09/27/2021   OSA-uses CPAP on a nightly basis.  Denies daytime somnolence and feels well rested when he wakes up.  GERD - controlled with prilosec 40 mg daily.   Neuropathy - managed by neurology. Currently prescribed Lyrica 25mg  BID.    All immunizations and health maintenance protocols were reviewed with the patient and needed orders were placed.  Appropriate screening laboratory values were ordered for the patient including screening of hyperlipidemia, renal function and hepatic function. If indicated by BPH, a PSA was ordered.  Medication reconciliation,  past medical history, social history, problem list and allergies were reviewed in detail with the patient  Goals were established with regard to weight loss, exercise, and  diet in compliance with medications. He is walking more and eating better.  Wt Readings from Last 3 Encounters:  09/29/22 263 lb (119.3 kg)  09/01/22 272 lb (123.4 kg)  06/12/22 275 lb 3.2 oz (124.8 kg)   Review of Systems  Constitutional:  Negative.   HENT: Negative.    Eyes: Negative.   Respiratory: Negative.    Cardiovascular: Negative.   Gastrointestinal: Negative.   Endocrine: Negative.   Genitourinary: Negative.   Musculoskeletal: Negative.   Skin: Negative.   Allergic/Immunologic: Negative.   Neurological: Negative.   Hematological: Negative.   Psychiatric/Behavioral: Negative.    All other systems reviewed and are negative.  Past Medical History:  Diagnosis Date   Arthritis    Cataract    right eye removed    Complication of anesthesia    "hard to wake up post op" per pt and wife    GERD (gastroesophageal reflux disease)    Hyperlipidemia    Hypertension    Neuromuscular disorder (HCC)    pinched nerve in foot   Sleep apnea    wears cpap     Social History   Socioeconomic History   Marital status: Married    Spouse name: Not on file   Number of children: 2   Years of education: 12   Highest education level: High school graduate  Occupational History   Occupation: retired  Tobacco Use   Smoking status: Former    Types: Cigarettes    Quit date: 02/28/1966    Years since quitting: 56.6   Smokeless tobacco: Never  Vaping Use   Vaping Use: Never used  Substance and Sexual Activity   Alcohol use: Yes    Comment: holidays   Drug use: No   Sexual activity: Yes  Other Topics Concern   Not on file  Social History Narrative   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   Two children who live in [place]   Married; [facility] 2    Right handed   Social Determinants of Health   Financial Resource Strain: Low Risk  (09/01/2022)   Overall Financial Resource Strain (CARDIA)    Difficulty of Paying Living Expenses: Not hard at all  Food Insecurity: No Food Insecurity (09/01/2022)   Hunger Vital Sign    Worried About Running Out of Food in the Last Year: Never true    Ran Out of Food in the Last Year: Never true  Transportation Needs: No Transportation Needs (09/01/2022)   PRAPARE -  Administrator, Civil Service (Medical): No    Lack of Transportation (Non-Medical): No  Physical Activity: Insufficiently Active (09/01/2022)   Exercise Vital Sign    Days of Exercise per Week: 3 days    Minutes of Exercise per Session: 30 min  Stress: No Stress Concern Present (09/01/2022)   [employer] of Occupational Health - Occupational Stress Questionnaire    Feeling of Stress : Not at all  Social Connections: Socially Integrated (09/01/2022)   Social Connection and Isolation Panel [NHANES]    Frequency of Communication with Friends and Family: More than three times a week    Frequency of Social Gatherings with Friends and Family: Three times a week    Attends Religious Services: More than 4 times per year    Active Member of Clubs or Organizations: Yes    Attends Banker Meetings: 1 to 4 times per year    Marital Status: Married  Catering manager Violence: Not on file    Past Surgical History:  Procedure Laterality Date   CATARACT EXTRACTION Right    COLONOSCOPY     JOINT REPLACEMENT     bilateral knee replacenent   TOTAL KNEE REVISION  05/30/2011   Procedure: TOTAL KNEE REVISION;  Surgeon: Cammy Copa, MD;  Location: [facility] OR;  Service: Orthopedics;  Laterality: Right;  Right total knee arthroplasty polyethylene spacer exchange    Family History  Problem Relation Age of Onset   Diabetes Sister    Stroke Sister    Colon cancer Neg Hx    Colon polyps Neg Hx    Esophageal cancer Neg Hx    Rectal cancer Neg Hx    Stomach cancer Neg Hx     Allergies  Allergen Reactions   Lisinopril Cough    Current Outpatient Medications on File Prior to Visit  Medication Sig Dispense Refill   amLODipine (NORVASC) 10 MG tablet Take 1 tablet (10 mg total) by mouth daily. 90 tablet 3   amoxicillin (AMOXIL) 500 MG capsule SMARTSIG:4 Capsule(s) By Mouth     amoxicillin (AMOXIL) 875 MG tablet SMARTSIG:1 Tablet(s) By Mouth     aspirin 81 MG tablet Take  81 mg by mouth daily.     chlorhexidine (PERIDEX) 0.12 % solution PLEASE SEE ATTACHED FOR DETAILED DIRECTIONS     ciclopirox (PENLAC) 8 % solution Apply topically at bedtime. Apply over nail and surrounding skin. Apply daily over previous coat. After seven (7) days, may remove with alcohol and continue cycle. 6.6 mL 2   clotrimazole-betamethasone (LOTRISONE) cream Apply 1 Application topically 2 (two) times daily. 30 g 2   hydrochlorothiazide (HYDRODIURIL) 25 MG tablet TAKE 1 TABLET BY MOUTH EVERY DAY 90 tablet 0   lidocaine (LIDODERM) 5 % Place 1 patch onto the skin daily. Remove & Discard patch within 12 hours or  as directed by MD 30 patch 0   losartan (COZAAR) 50 MG tablet Take 1 tablet (50 mg total) by mouth daily. 90 tablet 3   omeprazole (PRILOSEC) 40 MG capsule Take 1 capsule (40 mg total) by mouth daily. 90 capsule 3   pregabalin (LYRICA) 25 MG capsule Take 1 capsule (25 mg total) by mouth 2 (two) times daily. 60 capsule 5   simvastatin (ZOCOR) 10 MG tablet TAKE 1 TABLET BY MOUTH EVERYDAY AT BEDTIME 90 tablet 3   No current facility-administered medications on file prior to visit.    BP 122/82   Pulse 72   Temp (!) 97.4 F (36.3 C) (Oral)   Ht 5\' 8"  (1.727 m)   Wt 263 lb (119.3 kg)   SpO2 98%   BMI 39.99 kg/m       Objective:   Physical Exam Vitals and nursing note reviewed.  Constitutional:      General: He is not in acute distress.    Appearance: Normal appearance. He is obese. He is not ill-appearing.  HENT:     Head: Normocephalic and atraumatic.     Right Ear: Tympanic membrane, ear canal and external ear normal. There is no impacted cerumen.     Left Ear: Tympanic membrane, ear canal and external ear normal. There is no impacted cerumen.     Nose: Nose normal. No congestion or rhinorrhea.     Mouth/Throat:     Mouth: Mucous membranes are moist.     Pharynx: Oropharynx is clear.  Eyes:     Extraocular Movements: Extraocular movements intact.      Conjunctiva/sclera: Conjunctivae normal.     Pupils: Pupils are equal, round, and reactive to light.  Neck:     Vascular: No carotid bruit.  Cardiovascular:     Rate and Rhythm: Normal rate and regular rhythm.     Pulses: Normal pulses.     Heart sounds: No murmur heard.    No friction rub. No gallop.  Pulmonary:     Effort: Pulmonary effort is normal.     Breath sounds: Normal breath sounds.  Abdominal:     General: Abdomen is flat. Bowel sounds are normal. There is no distension.     Palpations: Abdomen is soft. There is no mass.     Tenderness: There is no abdominal tenderness. There is no guarding or rebound.     Hernia: No hernia is present.  Musculoskeletal:        General: Normal range of motion.     Cervical back: Normal range of motion and neck supple.     Right lower leg: 1+ Pitting Edema present.     Left lower leg: 1+ Pitting Edema present.  Lymphadenopathy:     Cervical: No cervical adenopathy.  Skin:    General: Skin is warm and dry.     Capillary Refill: Capillary refill takes less than 2 seconds.  Neurological:     General: No focal deficit present.     Mental Status: He is alert and oriented to person, place, and time.  Psychiatric:        Mood and Affect: Mood normal.        Behavior: Behavior normal.        Thought Content: Thought content normal.        Judgment: Judgment normal.       Assessment & Plan:  1. Routine general medical examination at a health care facility Today patient counseled on age appropriate routine health  concerns for screening and prevention, each reviewed and up to date or declined. Immunizations reviewed and up to date or declined. Labs ordered and reviewed. Risk factors for depression reviewed and negative. Hearing function and visual acuity are intact. ADLs screened and addressed as needed. Functional ability and level of safety reviewed and appropriate. Education, counseling and referrals performed based on assessed risks today.  Patient provided with a copy of personalized plan for preventive services. - Advised getting shingles vaccination at pharmacy. - Contine to   2. Essential hypertension - Well controlled. No change in medication  - CBC with Differential/Platelet; Future - Lipid panel; Future - Comprehensive metabolic panel; Future - TSH; Future - amLODipine (NORVASC) 10 MG tablet; Take 1 tablet (10 mg total) by mouth daily.  Dispense: 90 tablet; Refill: 3 - losartan (COZAAR) 50 MG tablet; Take 1 tablet (50 mg total) by mouth daily.  Dispense: 90 tablet; Refill: 3  3. Prostate cancer screening  - PSA; Future  4. OSA (obstructive sleep apnea) - Continue with CPAP  - CBC with Differential/Platelet; Future - Lipid panel; Future - Comprehensive metabolic panel; Future - TSH; Future  5. Hyperlipidemia, unspecified hyperlipidemia type - Continue statin  - CBC with Differential/Platelet; Future - Lipid panel; Future - Comprehensive metabolic panel; Future - TSH; Future  6. Gastroesophageal reflux disease without esophagitis - Continue PPI  - CBC with Differential/Platelet; Future - Lipid panel; Future - Comprehensive metabolic panel; Future - TSH; Future - omeprazole (PRILOSEC) 40 MG capsule; Take 1 capsule (40 mg total) by mouth daily.  Dispense: 90 capsule; Refill: 3  7. Neuropathy - Per neurology  - CBC with Differential/Platelet; Future - Lipid panel; Future - Comprehensive metabolic panel; Future - TSH; Future  [name], NP

## 2022-09-29 NOTE — Patient Instructions (Addendum)
It was great seeing you today   We will follow up with you regarding your lab work   Please let me know if you need anything   

## 2022-10-14 DIAGNOSIS — G4733 Obstructive sleep apnea (adult) (pediatric): Secondary | ICD-10-CM | POA: Diagnosis not present

## 2022-11-13 DIAGNOSIS — G4733 Obstructive sleep apnea (adult) (pediatric): Secondary | ICD-10-CM | POA: Diagnosis not present

## 2022-12-07 NOTE — Progress Notes (Signed)
NEUROLOGY FOLLOW UP OFFICE NOTE  [name] [id number]  Assessment/Plan:   Probable small fiber neuropathy Bilateral carpal tunnel syndrome     1  Increase pregablin to 50mg  twice daily.  We can increase dose to 75mg  twice daily in 4 weeks if needed. 2  Check B12 level 4  Follow up 6 months.     Subjective:  [name] is a 77 year old right-handed male with HTN, HLD, arthritis and OSA who follows up for neuropathy.  He is accompanied by his daughter who also provides history.  UPDATE: Current medications: pregablin 25mg  BID, B12 daily.  Neuropathy labs from 06/12/2022:  negative ANA/dsDNA/Scl70/SSA/SSB abs, ACE 47, B1 13, B6 4.6, IFE with polyclonal increase IgA but no M-spike, but B12 245.  Advised to start OTC supplement.   HISTORY: Patient reports bilateral foot pain for past 6 months.  Involves bottom of both feet at the arches.  A burning sensation.  Denies back pain or radicular pain down the legs.  Usually bothersome when on his feet but not laying down.  He has arthritis and onychomycosis of toenails in both feet contributing to pain as well.  He also reports bilateral hand numbness and pain for about a year.  Numbness and tingling and sharp pain involves all fingers except for thumb.  It happens throughout the day but also states it can wake him up at night.  No neck pain.  Wears gloves for arthritic pain which does not help.  Symptoms in the feet overall stable.  Symptoms in the hands have slightly worsened over the year.  Hgb A1c from May 2023 was 5.8 and TSH was 1.40.  NCV-EMG of right upper and lower extremities on 03/23/2022 personally reviewed revealed evidence of severe right carpal tunnel syndrome, as well as moderate right ulnar neuropathy across the elbow, and chronic right L5 radiculopathy but no evidence of large fiber sensorimotor polyneuropathy.     Past medications:  gabapentin.  PAST MEDICAL HISTORY: Past Medical History:  Diagnosis Date    Arthritis    Cataract    right eye removed    Complication of anesthesia    "hard to wake up post op" per pt and wife    GERD (gastroesophageal reflux disease)    Hyperlipidemia    Hypertension    Neuromuscular disorder (HCC)    pinched nerve in foot   Sleep apnea    wears cpap     MEDICATIONS: Current Outpatient Medications on File Prior to Visit  Medication Sig Dispense Refill   amLODipine (NORVASC) 10 MG tablet Take 1 tablet (10 mg total) by mouth daily. 90 tablet 3   amoxicillin (AMOXIL) 500 MG capsule SMARTSIG:4 Capsule(s) By Mouth     amoxicillin (AMOXIL) 875 MG tablet SMARTSIG:1 Tablet(s) By Mouth     aspirin 81 MG tablet Take 81 mg by mouth daily.     chlorhexidine (PERIDEX) 0.12 % solution PLEASE SEE ATTACHED FOR DETAILED DIRECTIONS     ciclopirox (PENLAC) 8 % solution Apply topically at bedtime. Apply over nail and surrounding skin. Apply daily over previous coat. After seven (7) days, may remove with alcohol and continue cycle. 6.6 mL 2   clotrimazole-betamethasone (LOTRISONE) cream Apply 1 Application topically 2 (two) times daily. 30 g 2   hydrochlorothiazide (HYDRODIURIL) 25 MG tablet TAKE 1 TABLET BY MOUTH EVERY DAY 90 tablet 0   lidocaine (LIDODERM) 5 % Place 1 patch onto the skin daily. Remove & Discard patch within 12 hours  or as directed by MD 30 patch 0   losartan (COZAAR) 50 MG tablet Take 1 tablet (50 mg total) by mouth daily. 90 tablet 3   omeprazole (PRILOSEC) 40 MG capsule Take 1 capsule (40 mg total) by mouth daily. 90 capsule 3   pregabalin (LYRICA) 25 MG capsule Take 1 capsule (25 mg total) by mouth 2 (two) times daily. 60 capsule 5   simvastatin (ZOCOR) 10 MG tablet TAKE 1 TABLET BY MOUTH EVERYDAY AT BEDTIME 90 tablet 3   No current facility-administered medications on file prior to visit.    ALLERGIES: Allergies  Allergen Reactions   Lisinopril Cough    FAMILY HISTORY: Family History  Problem Relation Age of Onset   Diabetes Sister    Stroke  Sister    Colon cancer Neg Hx    Colon polyps Neg Hx    Esophageal cancer Neg Hx    Rectal cancer Neg Hx    Stomach cancer Neg Hx       Objective:  Blood pressure (!) 153/75, pulse 73, height 5\' 9"  (1.753 m), weight 273 lb (123.8 kg), SpO2 96%. General: No acute distress.  Patient appears well-groomed.   Head:  Normocephalic/atraumatic Neck:  Supple.  No paraspinal tenderness.  Full range of motion. Heart:  Regular rate and rhythm. Neuro:  Alert and oriented.  Speech fluent and not dysarthric.  Language intact.  CN II-XII intact.  Bulk and tone normal.  Muscle strength 5/5 throughout.  Sensation to pinprick intact.  Vibratory sensation slightly decreased in toes.  Deep tendon reflexes 2+ throughout.  Gait normal.  Romberg negative.    Shon Millet, DO  CC: [name], NP

## 2022-12-11 ENCOUNTER — Other Ambulatory Visit: Payer: Medicare HMO

## 2022-12-11 ENCOUNTER — Ambulatory Visit: Payer: Medicare HMO | Admitting: Podiatry

## 2022-12-11 ENCOUNTER — Ambulatory Visit: Payer: Medicare HMO | Admitting: Neurology

## 2022-12-11 ENCOUNTER — Encounter: Payer: Self-pay | Admitting: Podiatry

## 2022-12-11 ENCOUNTER — Encounter: Payer: Self-pay | Admitting: Neurology

## 2022-12-11 VITALS — BP 153/75 | HR 73 | Ht 69.0 in | Wt 273.0 lb

## 2022-12-11 DIAGNOSIS — E538 Deficiency of other specified B group vitamins: Secondary | ICD-10-CM

## 2022-12-11 DIAGNOSIS — G629 Polyneuropathy, unspecified: Secondary | ICD-10-CM

## 2022-12-11 DIAGNOSIS — M79674 Pain in right toe(s): Secondary | ICD-10-CM

## 2022-12-11 DIAGNOSIS — B351 Tinea unguium: Secondary | ICD-10-CM | POA: Diagnosis not present

## 2022-12-11 DIAGNOSIS — M79675 Pain in left toe(s): Secondary | ICD-10-CM | POA: Diagnosis not present

## 2022-12-11 LAB — VITAMIN B12: Vitamin B-12: 754 pg/mL (ref 211–911)

## 2022-12-11 MED ORDER — PREGABALIN 50 MG PO CAPS: 50.0000 mg | ORAL_CAPSULE | Freq: Two times a day (BID) | ORAL | 5 refills | Status: DC

## 2022-12-11 NOTE — Progress Notes (Signed)
Subjective: Chief Complaint  Patient presents with   Nail Problem    Nail trim     77 y.o. returns the office today for painful, elongated, thickened toenails which he cannot trim himself.  He has been using the Penlac without too much improved.  No swelling redness or drainage.  No other concerns.    PCP: [name], NP Last Seen: 09/29/2022  A1c: 5.8 on Sep 27, 2021  Objective: AAO 3, NAD DP/PT pulses palpable, CRT less than 3 seconds Protective sensation decreased with Semmes Weinstein monofilament although mild. Nails hypertrophic, dystrophic, elongated, brittle, discolored 10. There is tenderness overlying the nails 1-5 bilaterally. There is no surrounding erythema or drainage along the nail sites. No open lesions or pre-ulcerative lesions are identified. The skin interdigitally is doing better.  There is no open lesions or any peeling skin present. No other areas of tenderness bilateral lower extremities. No overlying edema, erythema, increased warmth. No pain with calf compression, swelling, warmth, erythema.  Assessment: Patient presents with symptomatic onychomycosis, tinea pedis  Plan: -Treatment options including alternatives, risks, complications were discussed -Nails sharply debrided 10 without complication/bleeding. -Discussed option to oral medication or other topical medications. He wants to continue with the current treatment plan.  -Prescribed Lotrisone for the skin prn -Discussed daily foot inspection. If there are any changes, to call the office immediately.  -Follow-up in 3 months or sooner if any problems are to arise. In the meantime, encouraged to call the office with any questions, concerns, changes symptoms.  Ovid Curd, DPM

## 2022-12-11 NOTE — Patient Instructions (Addendum)
Increase pregablin to 50mg  twice daily.  If no improvement in 4 weeks, contact me Check vitamin B12 level Follow up 6 months.

## 2022-12-14 DIAGNOSIS — G4733 Obstructive sleep apnea (adult) (pediatric): Secondary | ICD-10-CM | POA: Diagnosis not present

## 2023-01-14 DIAGNOSIS — G4733 Obstructive sleep apnea (adult) (pediatric): Secondary | ICD-10-CM | POA: Diagnosis not present

## 2023-01-26 ENCOUNTER — Ambulatory Visit (INDEPENDENT_AMBULATORY_CARE_PROVIDER_SITE_OTHER): Payer: Medicare HMO | Admitting: Adult Health

## 2023-01-26 ENCOUNTER — Encounter: Payer: Self-pay | Admitting: Adult Health

## 2023-01-26 VITALS — BP 144/64 | HR 68 | Temp 97.9°F | Ht 69.0 in | Wt 272.0 lb

## 2023-01-26 DIAGNOSIS — R051 Acute cough: Secondary | ICD-10-CM

## 2023-01-26 LAB — POC COVID19 BINAXNOW: SARS Coronavirus 2 Ag: NEGATIVE

## 2023-01-26 MED ORDER — HYDROCODONE BIT-HOMATROP MBR 5-1.5 MG/5ML PO SOLN: 5.0000 mL | Freq: Three times a day (TID) | ORAL | 0 refills | Status: DC | PRN

## 2023-01-26 NOTE — Progress Notes (Signed)
Subjective:    Patient ID: [name], male    DOB: November 18, 1945, 77 y.o.   MRN: [id number]  Cough    77 year old male who  has a past medical history of Arthritis, Cataract, Complication of anesthesia, GERD (gastroesophageal reflux disease), Hyperlipidemia, Hypertension, Neuromuscular disorder (HCC), and Sleep apnea.  He presents to the office today for an acute issue. He reports that over the last 5 days he has had a cough and nasal drainage. Symptoms are worse at night.   He denies fevers,chills, headache, sinus pressure, shortness of breath, wheezing, or feeling ill.   Review of Systems  Respiratory:  Positive for cough.    See HPI   Past Medical History:  Diagnosis Date   Arthritis    Cataract    right eye removed    Complication of anesthesia    "hard to wake up post op" per pt and wife    GERD (gastroesophageal reflux disease)    Hyperlipidemia    Hypertension    Neuromuscular disorder (HCC)    pinched nerve in foot   Sleep apnea    wears cpap     Social History   Socioeconomic History   Marital status: Married    Spouse name: Not on file   Number of children: 2   Years of education: 12   Highest education level: High school graduate  Occupational History   Occupation: retired  Tobacco Use   Smoking status: Former    Current packs/day: 0.00    Types: Cigarettes    Quit date: 02/28/1966    Years since quitting: 56.9   Smokeless tobacco: Never  Vaping Use   Vaping status: Never Used  Substance and Sexual Activity   Alcohol use: Yes    Comment: holidays   Drug use: No   Sexual activity: Yes  Other Topics Concern   Not on file  Social History Narrative   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   Two children who live in [place]   Married; [facility]    Right handed   Social Determinants of Health   Financial Resource Strain: Low Risk  (09/01/2022)   Overall Financial Resource Strain (CARDIA)    Difficulty of Paying Living  Expenses: Not hard at all  Food Insecurity: No Food Insecurity (09/01/2022)   Hunger Vital Sign    Worried About Running Out of Food in the Last Year: Never true    Ran Out of Food in the Last Year: Never true  Transportation Needs: No Transportation Needs (09/01/2022)   PRAPARE - Administrator, Civil Service (Medical): No    Lack of Transportation (Non-Medical): No  Physical Activity: Insufficiently Active (09/01/2022)   Exercise Vital Sign    Days of Exercise per Week: 3 days    Minutes of Exercise per Session: 30 min  Stress: No Stress Concern Present (09/01/2022)   [employer] of Occupational Health - Occupational Stress Questionnaire    Feeling of Stress : Not at all  Social Connections: Socially Integrated (09/01/2022)   Social Connection and Isolation Panel [NHANES]    Frequency of Communication with Friends and Family: More than three times a week    Frequency of Social Gatherings with Friends and Family: Three times a week    Attends Religious Services: More than 4 times per year    Active Member of Clubs or Organizations: Yes    Attends Banker Meetings: 1 to 4  times per year    Marital Status: Married  Catering manager Violence: Not on file    Past Surgical History:  Procedure Laterality Date   CATARACT EXTRACTION Right    COLONOSCOPY     JOINT REPLACEMENT     bilateral knee replacenent   TOTAL KNEE REVISION  05/30/2011   Procedure: TOTAL KNEE REVISION;  Surgeon: Cammy Copa, MD;  Location: [facility] OR;  Service: Orthopedics;  Laterality: Right;  Right total knee arthroplasty polyethylene spacer exchange    Family History  Problem Relation Age of Onset   Diabetes Sister    Stroke Sister    Colon cancer Neg Hx    Colon polyps Neg Hx    Esophageal cancer Neg Hx    Rectal cancer Neg Hx    Stomach cancer Neg Hx     Allergies  Allergen Reactions   Lisinopril Cough    Current Outpatient Medications on File Prior to Visit  Medication  Sig Dispense Refill   amLODipine (NORVASC) 10 MG tablet Take 1 tablet (10 mg total) by mouth daily. 90 tablet 3   amoxicillin (AMOXIL) 500 MG capsule SMARTSIG:4 Capsule(s) By Mouth     amoxicillin (AMOXIL) 875 MG tablet SMARTSIG:1 Tablet(s) By Mouth     aspirin 81 MG tablet Take 81 mg by mouth daily.     chlorhexidine (PERIDEX) 0.12 % solution PLEASE SEE ATTACHED FOR DETAILED DIRECTIONS     ciclopirox (PENLAC) 8 % solution Apply topically at bedtime. Apply over nail and surrounding skin. Apply daily over previous coat. After seven (7) days, may remove with alcohol and continue cycle. 6.6 mL 2   clotrimazole-betamethasone (LOTRISONE) cream Apply 1 Application topically 2 (two) times daily. 30 g 2   cyanocobalamin (VITAMIN B12) 500 MCG tablet Take 500 mcg by mouth daily.     hydrochlorothiazide (HYDRODIURIL) 25 MG tablet TAKE 1 TABLET BY MOUTH EVERY DAY 90 tablet 0   ketoconazole (NIZORAL) 2 % cream Apply 1 Application topically daily.     lidocaine (LIDODERM) 5 % Place 1 patch onto the skin daily. Remove & Discard patch within 12 hours or as directed by MD 30 patch 0   losartan (COZAAR) 50 MG tablet Take 1 tablet (50 mg total) by mouth daily. 90 tablet 3   omeprazole (PRILOSEC) 40 MG capsule Take 1 capsule (40 mg total) by mouth daily. 90 capsule 3   pregabalin (LYRICA) 50 MG capsule Take 1 capsule (50 mg total) by mouth 2 (two) times daily. 60 capsule 5   simvastatin (ZOCOR) 10 MG tablet TAKE 1 TABLET BY MOUTH EVERYDAY AT BEDTIME 90 tablet 3   No current facility-administered medications on file prior to visit.    BP (!) 144/64   Pulse 68   Temp 97.9 F (36.6 C) (Oral)   Ht 5\' 9"  (1.753 m)   Wt 272 lb (123.4 kg)   SpO2 98%   BMI 40.17 kg/m       Objective:   Physical Exam Vitals and nursing note reviewed.  Constitutional:      Appearance: Normal appearance.  Cardiovascular:     Rate and Rhythm: Normal rate and regular rhythm.     Pulses: Normal pulses.     Heart sounds: Normal  heart sounds.  Pulmonary:     Effort: Pulmonary effort is normal.     Breath sounds: Normal breath sounds.  Musculoskeletal:        General: Normal range of motion.  Skin:    General: Skin is warm  and dry.  Neurological:     General: No focal deficit present.     Mental Status: He is alert and oriented to person, place, and time.  Psychiatric:        Mood and Affect: Mood normal.        Behavior: Behavior normal.        Thought Content: Thought content normal.        Judgment: Judgment normal.       Assessment & Plan:  1. Acute cough  - HYDROcodone bit-homatropine (HYCODAN) 5-1.5 MG/5ML syrup; Take 5 mLs by mouth every 8 (eight) hours as needed for cough.  Dispense: 120 mL; Refill: 0 - POC COVID-19- negative    [name], NP

## 2023-02-12 DIAGNOSIS — G4733 Obstructive sleep apnea (adult) (pediatric): Secondary | ICD-10-CM | POA: Diagnosis not present

## 2023-02-15 ENCOUNTER — Other Ambulatory Visit: Payer: Self-pay | Admitting: Adult Health

## 2023-02-15 DIAGNOSIS — I10 Essential (primary) hypertension: Secondary | ICD-10-CM

## 2023-02-15 DIAGNOSIS — Z760 Encounter for issue of repeat prescription: Secondary | ICD-10-CM

## 2023-02-20 ENCOUNTER — Other Ambulatory Visit: Payer: Self-pay | Admitting: Podiatry

## 2023-02-20 ENCOUNTER — Other Ambulatory Visit: Payer: Self-pay | Admitting: Adult Health

## 2023-02-20 DIAGNOSIS — Z760 Encounter for issue of repeat prescription: Secondary | ICD-10-CM

## 2023-02-20 DIAGNOSIS — I10 Essential (primary) hypertension: Secondary | ICD-10-CM

## 2023-03-10 ENCOUNTER — Other Ambulatory Visit: Payer: Self-pay | Admitting: Adult Health

## 2023-03-10 ENCOUNTER — Other Ambulatory Visit: Payer: Self-pay | Admitting: Podiatry

## 2023-03-10 DIAGNOSIS — I10 Essential (primary) hypertension: Secondary | ICD-10-CM

## 2023-03-10 DIAGNOSIS — Z760 Encounter for issue of repeat prescription: Secondary | ICD-10-CM

## 2023-03-13 ENCOUNTER — Encounter: Payer: Self-pay | Admitting: Podiatry

## 2023-03-13 ENCOUNTER — Ambulatory Visit: Payer: Medicare HMO | Admitting: Podiatry

## 2023-03-13 DIAGNOSIS — B351 Tinea unguium: Secondary | ICD-10-CM | POA: Diagnosis not present

## 2023-03-13 DIAGNOSIS — M79674 Pain in right toe(s): Secondary | ICD-10-CM | POA: Diagnosis not present

## 2023-03-13 DIAGNOSIS — M79675 Pain in left toe(s): Secondary | ICD-10-CM

## 2023-03-13 NOTE — Progress Notes (Signed)
Subjective: Chief Complaint  Patient presents with   Routine Post Op    PATIENT STATES THAT EVERYTHING IS FINE , PATIENT STATES HE IS NOT IN MUCH PAIN     77 y.o. returns the office today for painful, elongated, thickened toenails which he cannot trim himself.  He has been using the Penlac. No swelling redness or drainage.  He has been using moisturizer and feels well.  No other concerns.    PCP: [name], NP Last Seen: 01/26/23  A1c: 5.8 on Sep 27, 2021  Objective: AAO 3, NAD DP/PT pulses palpable, CRT less than 3 seconds Protective sensation decreased with Semmes Weinstein monofilament although mild. Nails hypertrophic, dystrophic, elongated, brittle, discolored 10. There is tenderness overlying the nails 1-5 bilaterally. There is no surrounding erythema or drainage along the nail sites. No open lesions or pre-ulcerative lesions are identified. No significant atrophy resolve ulceration.  Dry skin present plantar aspect from the arch.  No skin breakdown or warmth. No pain with calf compression, swelling, warmth, erythema.  Assessment: Patient presents with symptomatic onychomycosis  Plan: -Treatment options including alternatives, risks, complications were discussed -Nails sharply debrided 10 without complication/bleeding. -Discussed moisturizer for the feet daily.  Do not apply interdigitally. -Discussed daily foot inspection. If there are any changes, to call the office immediately.  -Follow-up in 3 months or sooner if any problems are to arise. In the meantime, encouraged to call the office with any questions, concerns, changes symptoms.  Ovid Curd, DPM

## 2023-04-13 DIAGNOSIS — H524 Presbyopia: Secondary | ICD-10-CM | POA: Diagnosis not present

## 2023-04-13 DIAGNOSIS — Z0100 Encounter for examination of eyes and vision without abnormal findings: Secondary | ICD-10-CM | POA: Diagnosis not present

## 2023-05-15 DIAGNOSIS — G4733 Obstructive sleep apnea (adult) (pediatric): Secondary | ICD-10-CM | POA: Diagnosis not present

## 2023-06-14 ENCOUNTER — Ambulatory Visit: Payer: HMO | Admitting: Podiatry

## 2023-06-14 ENCOUNTER — Encounter: Payer: Self-pay | Admitting: Podiatry

## 2023-06-14 DIAGNOSIS — M79674 Pain in right toe(s): Secondary | ICD-10-CM | POA: Diagnosis not present

## 2023-06-14 DIAGNOSIS — B351 Tinea unguium: Secondary | ICD-10-CM

## 2023-06-14 DIAGNOSIS — M79675 Pain in left toe(s): Secondary | ICD-10-CM | POA: Diagnosis not present

## 2023-06-14 MED ORDER — AMMONIUM LACTATE 12 % EX CREA: 1.0000 | TOPICAL_CREAM | CUTANEOUS | 0 refills | Status: DC | PRN

## 2023-06-14 MED ORDER — CICLOPIROX 8 % EX SOLN: Freq: Every day | CUTANEOUS | 2 refills | Status: DC

## 2023-06-14 NOTE — Progress Notes (Signed)
Subjective: Chief Complaint  Patient presents with   RFC    Rm#11 RFC patient has no other concerns at this time.    78 y.o. returns the office today for painful, elongated, thickened toenails which he cannot trim himself.  He has been using the Penlac and needs a refill.  No other concerns.    PCP: [name], NP Last Seen: 01/26/23  Objective: AAO 3, NAD DP/PT pulses palpable, CRT less than 3 seconds Protective sensation decreased with Semmes Weinstein monofilament although mild. Nails hypertrophic, dystrophic, elongated, brittle, discolored 10. There is tenderness overlying the nails 1-5 bilaterally. There is no surrounding erythema or drainage along the nail sites. No open lesions or pre-ulcerative lesions are identified. Dry skin present plantar aspect from the arch.  No skin breakdown or warmth. No pain with calf compression, swelling, warmth, erythema.  Assessment: Patient presents with symptomatic onychomycosis  Plan: -Treatment options including alternatives, risks, complications were discussed -Nails sharply debrided 10 without complication/bleeding.  Refill Penlac -Discussed moisturizer for the feet daily.  Do not apply interdigitally.  Prescribed AmLactin -Discussed daily foot inspection. If there are any changes, to call the office immediately.  -Follow-up in 3 months or sooner if any problems are to arise. In the meantime, encouraged to call the office with any questions, concerns, changes symptoms.  Ovid Curd, DPM

## 2023-06-14 NOTE — Progress Notes (Signed)
NEUROLOGY FOLLOW UP OFFICE NOTE  [name] [id number]  Assessment/Plan:   Probable small fiber neuropathy Bilateral carpal tunnel syndrome B12 deficiency     1  Increase pregablin to 75mg  twice daily.  We can increase dose in 6 weeks if needed. 2  Will have him wear wrist splints at night.  If symptoms do not improve, repeat NCV-EMG of upper extremities bilaterally and send to hand surgeon if needed 3  Follow up 6 months.      Subjective:  [name] is a 78 year old right-handed male with HTN, HLD, arthritis and OSA who follows up for neuropathy.  He is accompanied by his daughter who also provides history.  UPDATE: Current medications: pregablin 50mg  BID, B12 daily.  Repeat B12 level was 754.  Increased pregablin to 50mg  twice daily.  No improvement.   HISTORY: Patient reports bilateral foot pain for past 6 months.  Involves bottom of both feet at the arches.  A burning sensation.  Denies back pain or radicular pain down the legs.  Usually bothersome when on his feet but not laying down.  He has arthritis and onychomycosis of toenails in both feet contributing to pain as well.  He also reports bilateral hand numbness and pain for about a year.  Numbness and tingling and sharp pain involves all fingers except for thumb.  It happens throughout the day but also states it can wake him up at night.  No neck pain.  Wears gloves for arthritic pain which does not help.  Symptoms in the feet overall stable.  Symptoms in the hands have slightly worsened over the year.  Hgb A1c from May 2023 was 5.8 and TSH was 1.40.  NCV-EMG of right upper and lower extremities on 03/23/2022 personally reviewed revealed evidence of severe right carpal tunnel syndrome, as well as moderate right ulnar neuropathy across the elbow, and chronic right L5 radiculopathy but no evidence of large fiber sensorimotor polyneuropathy.  Neuropathy labs from 06/12/2022:  negative ANA/dsDNA/Scl70/SSA/SSB abs,  ACE 47, B1 13, B6 4.6, IFE with polyclonal increase IgA but no M-spike, but B12 245.  Advised to start OTC supplement.   Past medications:  gabapentin.  PAST MEDICAL HISTORY: Past Medical History:  Diagnosis Date   Arthritis    Cataract    right eye removed    Complication of anesthesia    "hard to wake up post op" per pt and wife    GERD (gastroesophageal reflux disease)    Hyperlipidemia    Hypertension    Neuromuscular disorder (HCC)    pinched nerve in foot   Sleep apnea    wears cpap     MEDICATIONS: Current Outpatient Medications on File Prior to Visit  Medication Sig Dispense Refill   amLODipine (NORVASC) 10 MG tablet Take 1 tablet (10 mg total) by mouth daily. 90 tablet 3   amoxicillin (AMOXIL) 500 MG capsule SMARTSIG:4 Capsule(s) By Mouth     amoxicillin (AMOXIL) 875 MG tablet SMARTSIG:1 Tablet(s) By Mouth     aspirin 81 MG tablet Take 81 mg by mouth daily.     chlorhexidine (PERIDEX) 0.12 % solution PLEASE SEE ATTACHED FOR DETAILED DIRECTIONS     ciclopirox (PENLAC) 8 % solution Apply topically at bedtime. Apply over nail and surrounding skin. Apply daily over previous coat. After seven (7) days, may remove with alcohol and continue cycle. 6.6 mL 2   clotrimazole-betamethasone (LOTRISONE) cream Apply 1 Application topically 2 (two) times daily. 30 g 2  cyanocobalamin (VITAMIN B12) 500 MCG tablet Take 500 mcg by mouth daily.     hydrochlorothiazide (HYDRODIURIL) 25 MG tablet TAKE 1 TABLET BY MOUTH EVERY DAY 90 tablet 0   HYDROcodone bit-homatropine (HYCODAN) 5-1.5 MG/5ML syrup Take 5 mLs by mouth every 8 (eight) hours as needed for cough. 120 mL 0   ketoconazole (NIZORAL) 2 % cream APPLY 1 APPLICATION TOPICALLY DAILY 60 g 0   lidocaine (LIDODERM) 5 % Place 1 patch onto the skin daily. Remove & Discard patch within 12 hours or as directed by MD 30 patch 0   losartan (COZAAR) 50 MG tablet Take 1 tablet (50 mg total) by mouth daily. 90 tablet 3   omeprazole (PRILOSEC) 40  MG capsule Take 1 capsule (40 mg total) by mouth daily. 90 capsule 3   pregabalin (LYRICA) 50 MG capsule Take 1 capsule (50 mg total) by mouth 2 (two) times daily. 60 capsule 5   simvastatin (ZOCOR) 10 MG tablet TAKE 1 TABLET BY MOUTH EVERYDAY AT BEDTIME 90 tablet 3   No current facility-administered medications on file prior to visit.    ALLERGIES: Allergies  Allergen Reactions   Lisinopril Cough    FAMILY HISTORY: Family History  Problem Relation Age of Onset   Diabetes Sister    Stroke Sister    Colon cancer Neg Hx    Colon polyps Neg Hx    Esophageal cancer Neg Hx    Rectal cancer Neg Hx    Stomach cancer Neg Hx       Objective:  Blood pressure (!) 146/75, pulse 76, height 5\' 9"  (1.753 m), weight 277 lb (125.6 kg), SpO2 97%. General: No acute distress.  Patient appears well-groomed.       Shon Millet, DO  CC: [name], NP

## 2023-06-15 ENCOUNTER — Encounter: Payer: Self-pay | Admitting: Neurology

## 2023-06-15 ENCOUNTER — Other Ambulatory Visit: Payer: HMO

## 2023-06-15 ENCOUNTER — Ambulatory Visit: Payer: HMO | Admitting: Neurology

## 2023-06-15 VITALS — BP 146/75 | HR 76 | Ht 69.0 in | Wt 277.0 lb

## 2023-06-15 DIAGNOSIS — G629 Polyneuropathy, unspecified: Secondary | ICD-10-CM

## 2023-06-15 DIAGNOSIS — E538 Deficiency of other specified B group vitamins: Secondary | ICD-10-CM | POA: Diagnosis not present

## 2023-06-15 DIAGNOSIS — G5601 Carpal tunnel syndrome, right upper limb: Secondary | ICD-10-CM

## 2023-06-15 LAB — VITAMIN B12: Vitamin B-12: >2000 pg/mL — ABNORMAL HIGH (ref 200–1100)

## 2023-06-15 MED ORDER — PREGABALIN 75 MG PO CAPS: 75.0000 mg | ORAL_CAPSULE | Freq: Two times a day (BID) | ORAL | 5 refills | Status: DC

## 2023-06-15 NOTE — Patient Instructions (Addendum)
Increase pregablin to 75mg  twice daily.  If no improvement in 6 weeks, contact me and we can still increase dose Wear bilateral wrist splints at night Check B12 level .........Marland KitchenYour provider has requested that you have labwork completed today. The lab is located on the Second floor at [address], within the [facility] office. When you get off the elevator, turn right and go in the [facility] [address]; the first brown door on the left.  Tell the ladies behind the desk that you are there for lab work. If you are not called within 15 minutes please check with the front desk.   Once you complete your labs you are free to go. You will receive a call or message via MyChart with your lab results.

## 2023-06-19 ENCOUNTER — Telehealth: Payer: Self-pay | Admitting: Neurology

## 2023-06-19 NOTE — Telephone Encounter (Signed)
Patient is returning a call to the office he states he thinks It was nurse that called him

## 2023-06-19 NOTE — Progress Notes (Signed)
 Patient advised.

## 2023-06-19 NOTE — Telephone Encounter (Signed)
 See results note.

## 2023-08-11 ENCOUNTER — Other Ambulatory Visit: Payer: Self-pay | Admitting: Adult Health

## 2023-08-11 DIAGNOSIS — E785 Hyperlipidemia, unspecified: Secondary | ICD-10-CM

## 2023-08-12 ENCOUNTER — Other Ambulatory Visit: Payer: Self-pay | Admitting: Adult Health

## 2023-08-12 DIAGNOSIS — Z760 Encounter for issue of repeat prescription: Secondary | ICD-10-CM

## 2023-08-12 DIAGNOSIS — I10 Essential (primary) hypertension: Secondary | ICD-10-CM

## 2023-08-13 DIAGNOSIS — G4733 Obstructive sleep apnea (adult) (pediatric): Secondary | ICD-10-CM | POA: Diagnosis not present

## 2023-08-28 ENCOUNTER — Other Ambulatory Visit: Payer: Self-pay | Admitting: Adult Health

## 2023-08-28 DIAGNOSIS — E785 Hyperlipidemia, unspecified: Secondary | ICD-10-CM

## 2023-09-13 ENCOUNTER — Encounter: Payer: Self-pay | Admitting: Podiatry

## 2023-09-13 ENCOUNTER — Ambulatory Visit: Payer: HMO | Admitting: Podiatry

## 2023-09-13 DIAGNOSIS — B351 Tinea unguium: Secondary | ICD-10-CM | POA: Diagnosis not present

## 2023-09-13 DIAGNOSIS — M79674 Pain in right toe(s): Secondary | ICD-10-CM

## 2023-09-13 DIAGNOSIS — M79675 Pain in left toe(s): Secondary | ICD-10-CM | POA: Diagnosis not present

## 2023-09-13 MED ORDER — CICLOPIROX 8 % EX SOLN: Freq: Every day | CUTANEOUS | 0 refills | Status: DC

## 2023-09-13 NOTE — Progress Notes (Signed)
 Subjective: Chief Complaint  Patient presents with   RFC    RM#13 RFC patient states ins will not pay for medication for toe nail treatment is requesting something else.    78 y.o. returns the office today for painful, elongated, thickened toenails which he cannot trim himself.  He has been using the Penlac  and needs a refill again today. No open wounds.  No other concerns.    PCP: [place], NP Last Seen: 01/26/23  Objective: AAO 3, NAD DP/PT pulses palpable, CRT less than 3 seconds Protective sensation decreased with Semmes Weinstein monofilament Nails hypertrophic, dystrophic, elongated, brittle, discolored 10. There is tenderness overlying the nails 1-5 bilaterally. There is no surrounding erythema or drainage along the nail sites. No open lesions or pre-ulcerative lesions are identified. Dry skin present plantar aspect from the arch.  No skin breakdown or warmth. No pain with calf compression, swelling, warmth, erythema.  Assessment: Symptomatic onychomycosis  Plan: -Treatment options including alternatives, risks, complications were discussed -Nails sharply debrided 10 without complication/bleeding.  Refill Penlac  -Continue moisturizer for the feet daily.  Do not apply interdigitally.  -Discussed daily foot inspection. If there are any changes, to call the office immediately.  -Follow-up in 3 months or sooner if any problems are to arise. In the meantime, encouraged to call the office with any questions, concerns, changes symptoms.  Bobbie Burows, DPM

## 2023-09-17 ENCOUNTER — Telehealth: Payer: Self-pay

## 2023-09-17 NOTE — Telephone Encounter (Signed)
 PA request received from covermymeds for ciclopirox  8% solution. PA submitted and waiting on response.  Rosendo Conroy  (Key: [id number]) Rx #: [phone]

## 2023-09-20 NOTE — Telephone Encounter (Signed)
Ciclopirox was denied.

## 2023-09-24 NOTE — Telephone Encounter (Signed)
 I called and left message for patient to return call to office to discuss.

## 2023-09-25 NOTE — Telephone Encounter (Signed)
 Patient returned call and options were discussed. He opted to try the OTC medication.

## 2023-10-13 ENCOUNTER — Other Ambulatory Visit: Payer: Self-pay | Admitting: Adult Health

## 2023-10-13 ENCOUNTER — Other Ambulatory Visit: Payer: Self-pay | Admitting: Podiatry

## 2023-10-13 DIAGNOSIS — K219 Gastro-esophageal reflux disease without esophagitis: Secondary | ICD-10-CM

## 2023-10-13 DIAGNOSIS — I10 Essential (primary) hypertension: Secondary | ICD-10-CM

## 2023-10-13 DIAGNOSIS — Z760 Encounter for issue of repeat prescription: Secondary | ICD-10-CM

## 2023-10-16 NOTE — Telephone Encounter (Signed)
 Pt has a CPE tomorrow. Will call pt to see if any refills he has on hand to cover him.

## 2023-10-16 NOTE — Telephone Encounter (Signed)
 Spoke to pt and he stated that he has enough medication.

## 2023-10-17 ENCOUNTER — Ambulatory Visit (INDEPENDENT_AMBULATORY_CARE_PROVIDER_SITE_OTHER)

## 2023-10-17 ENCOUNTER — Other Ambulatory Visit: Payer: Self-pay | Admitting: Adult Health

## 2023-10-17 ENCOUNTER — Encounter: Payer: Self-pay | Admitting: Adult Health

## 2023-10-17 ENCOUNTER — Ambulatory Visit (INDEPENDENT_AMBULATORY_CARE_PROVIDER_SITE_OTHER): Admitting: Adult Health

## 2023-10-17 VITALS — BP 130/60 | HR 72 | Temp 98.0°F | Ht 69.0 in | Wt 269.0 lb

## 2023-10-17 DIAGNOSIS — I10 Essential (primary) hypertension: Secondary | ICD-10-CM

## 2023-10-17 DIAGNOSIS — G4733 Obstructive sleep apnea (adult) (pediatric): Secondary | ICD-10-CM

## 2023-10-17 DIAGNOSIS — Z0000 Encounter for general adult medical examination without abnormal findings: Secondary | ICD-10-CM

## 2023-10-17 DIAGNOSIS — R7301 Impaired fasting glucose: Secondary | ICD-10-CM | POA: Diagnosis not present

## 2023-10-17 DIAGNOSIS — K219 Gastro-esophageal reflux disease without esophagitis: Secondary | ICD-10-CM

## 2023-10-17 DIAGNOSIS — Z125 Encounter for screening for malignant neoplasm of prostate: Secondary | ICD-10-CM | POA: Diagnosis not present

## 2023-10-17 DIAGNOSIS — E785 Hyperlipidemia, unspecified: Secondary | ICD-10-CM

## 2023-10-17 DIAGNOSIS — G629 Polyneuropathy, unspecified: Secondary | ICD-10-CM | POA: Diagnosis not present

## 2023-10-17 LAB — COMPREHENSIVE METABOLIC PANEL WITH GFR
ALT: 15 U/L (ref 0–53)
AST: 16 U/L (ref 0–37)
Albumin: 4.2 g/dL (ref 3.5–5.2)
Alkaline Phosphatase: 84 U/L (ref 39–117)
BUN: 14 mg/dL (ref 6–23)
CO2: 27 meq/L (ref 19–32)
Calcium: 9.7 mg/dL (ref 8.4–10.5)
Chloride: 105 meq/L (ref 96–112)
Creatinine, Ser: 1.04 mg/dL (ref 0.40–1.50)
GFR: 68.73 mL/min (ref >60.00)
Glucose, Bld: 129 mg/dL — ABNORMAL HIGH (ref 70–99)
Potassium: 3.8 meq/L (ref 3.5–5.1)
Sodium: 140 meq/L (ref 135–145)
Total Bilirubin: 0.9 mg/dL (ref 0.2–1.2)
Total Protein: 7.4 g/dL (ref 6.0–8.3)

## 2023-10-17 LAB — CBC
HCT: 37.7 % — ABNORMAL LOW (ref 39.0–52.0)
Hemoglobin: 13.1 g/dL (ref 13.0–17.0)
MCHC: 34.6 g/dL (ref 30.0–36.0)
MCV: 91.7 fl (ref 78.0–100.0)
Platelets: 222 10*3/uL (ref 150.0–400.0)
RBC: 4.12 Mil/uL — ABNORMAL LOW (ref 4.22–5.81)
RDW: 12.3 % (ref 11.5–15.5)
WBC: 4.6 10*3/uL (ref 4.0–10.5)

## 2023-10-17 LAB — LIPID PANEL
Cholesterol: 137 mg/dL (ref 0–200)
HDL: 40.7 mg/dL (ref >39.00)
LDL Cholesterol: 80 mg/dL (ref 0–99)
NonHDL: 96.19
Total CHOL/HDL Ratio: 3
Triglycerides: 79 mg/dL (ref 0.0–149.0)
VLDL: 15.8 mg/dL (ref 0.0–40.0)

## 2023-10-17 LAB — HEMOGLOBIN A1C: Hgb A1c MFr Bld: 5.5 % (ref 4.6–6.5)

## 2023-10-17 LAB — TSH: TSH: 1.61 u[IU]/mL (ref 0.35–5.50)

## 2023-10-17 LAB — PSA: PSA: 3.97 ng/mL (ref 0.10–4.00)

## 2023-10-17 NOTE — Patient Instructions (Addendum)
 It was great seeing you today   We will follow up with you regarding your lab work   Please let me know if you need anything   You can get the shingles and tetanus booster at the pharmacy

## 2023-10-17 NOTE — Progress Notes (Signed)
 Subjective:    Patient ID: [name], male    DOB: 1945-06-20, 78 y.o.   MRN: [id number]  HPI  Patient presents for yearly preventative medicine examination. He is a pleasant 78 year old male who  has a past medical history of Arthritis, Cataract, Complication of anesthesia, GERD (gastroesophageal reflux disease), Hyperlipidemia, Hypertension, Neuromuscular disorder (HCC), and Sleep apnea.  Hypertension -he is managed with Norvasc  10 mg daily, HCTZ 25 mg daily, and Cozaar  50 mg daily.  Denies shortness of breath, dizziness, lightheadedness, chest pain, or syncopal episodes BP Readings from Last 3 Encounters:  10/17/23 130/60  06/15/23 (!) 146/75  01/26/23 (!) 144/64   Hyperlipidemia-takes Zocor  10 mg daily.  He denies myalgia or fatigue Lab Results  Component Value Date   CHOL 144 09/29/2022   HDL 35.90 (L) 09/29/2022   LDLCALC 97 09/29/2022   LDLDIRECT 167.6 07/08/2009   TRIG 58.0 09/29/2022   CHOLHDL 4 09/29/2022    OSA-uses CPAP on a nightly basis.  Denies daytime somnolence and feels well rested when he wakes up.  GERD - controlled with prilosec 40 mg daily.   Neuropathy - managed by neurology. Currently prescribed Lyrica  75 mg BID. Reports that he still has burning pain in his feet.    All immunizations and health maintenance protocols were reviewed with the patient and needed orders were placed. Refused Tdap and Shingles vaccination   Appropriate screening laboratory values were ordered for the patient including screening of hyperlipidemia, renal function and hepatic function. If indicated by BPH, a PSA was ordered.  Medication reconciliation,  past medical history, social history, problem list and allergies were reviewed in detail with the patient  Goals were established with regard to weight loss, exercise, and  diet in compliance with medications. He has been working out more often, going to [employer] 3x a week and has changes his diet.  Wt Readings from Last  3 Encounters:  10/17/23 269 lb (122 kg)  06/15/23 277 lb (125.6 kg)  01/26/23 272 lb (123.4 kg)    Review of Systems  Constitutional: Negative.   HENT: Negative.    Eyes: Negative.   Respiratory: Negative.    Cardiovascular: Negative.   Gastrointestinal: Negative.   Endocrine: Negative.   Genitourinary: Negative.   Musculoskeletal:  Positive for arthralgias.  Skin: Negative.   Allergic/Immunologic: Negative.   Neurological: Negative.   Hematological: Negative.   Psychiatric/Behavioral: Negative.    All other systems reviewed and are negative.  Past Medical History:  Diagnosis Date   Arthritis    Cataract    right eye removed    Complication of anesthesia    "hard to wake up post op" per pt and wife    GERD (gastroesophageal reflux disease)    Hyperlipidemia    Hypertension    Neuromuscular disorder (HCC)    pinched nerve in foot   Sleep apnea    wears cpap     Social History   Socioeconomic History   Marital status: Married    Spouse name: Not on file   Number of children: 2   Years of education: 12   Highest education level: High school graduate  Occupational History   Occupation: retired  Tobacco Use   Smoking status: Former    Current packs/day: 0.00    Types: Cigarettes    Quit date: 02/28/1966    Years since quitting: 57.6   Smokeless tobacco: Never  Vaping Use   Vaping status: Never Used  Substance and  Sexual Activity   Alcohol use: Yes    Comment: holidays   Drug use: No   Sexual activity: Yes  Other Topics Concern   Not on file  Social History Narrative   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   Two children who live in [place]   Married; [facility] 2    Right handed   Social Drivers of Health   Financial Resource Strain: Low Risk  (09/01/2022)   Overall Financial Resource Strain (CARDIA)    Difficulty of Paying Living Expenses: Not hard at all  Food Insecurity: No Food Insecurity (09/01/2022)   Hunger Vital Sign     Worried About Running Out of Food in the Last Year: Never true    Ran Out of Food in the Last Year: Never true  Transportation Needs: No Transportation Needs (09/01/2022)   PRAPARE - Administrator, Civil Service (Medical): No    Lack of Transportation (Non-Medical): No  Physical Activity: Insufficiently Active (09/01/2022)   Exercise Vital Sign    Days of Exercise per Week: 3 days    Minutes of Exercise per Session: 30 min  Stress: No Stress Concern Present (09/01/2022)   [employer] of Occupational Health - Occupational Stress Questionnaire    Feeling of Stress : Not at all  Social Connections: Socially Integrated (09/01/2022)   Social Connection and Isolation Panel [NHANES]    Frequency of Communication with Friends and Family: More than three times a week    Frequency of Social Gatherings with Friends and Family: Three times a week    Attends Religious Services: More than 4 times per year    Active Member of Clubs or Organizations: Yes    Attends Banker Meetings: 1 to 4 times per year    Marital Status: Married  Catering manager Violence: Not on file    Past Surgical History:  Procedure Laterality Date   CATARACT EXTRACTION Right    COLONOSCOPY     JOINT REPLACEMENT     bilateral knee replacenent   TOTAL KNEE REVISION  05/30/2011   Procedure: TOTAL KNEE REVISION;  Surgeon: Jasmine Mesi, MD;  Location: [facility] OR;  Service: Orthopedics;  Laterality: Right;  Right total knee arthroplasty polyethylene spacer exchange    Family History  Problem Relation Age of Onset   Diabetes Sister    Stroke Sister    Colon cancer Neg Hx    Colon polyps Neg Hx    Esophageal cancer Neg Hx    Rectal cancer Neg Hx    Stomach cancer Neg Hx     Allergies  Allergen Reactions   Lisinopril  Cough    Current Outpatient Medications on File Prior to Visit  Medication Sig Dispense Refill   amLODipine  (NORVASC ) 10 MG tablet Take 1 tablet (10 mg total) by mouth daily.  90 tablet 3   aspirin  81 MG tablet Take 81 mg by mouth daily.     hydrochlorothiazide  (HYDRODIURIL ) 25 MG tablet TAKE 1 TABLET BY MOUTH EVERY DAY 90 tablet 0   losartan  (COZAAR ) 50 MG tablet Take 1 tablet (50 mg total) by mouth daily. 90 tablet 3   omeprazole  (PRILOSEC) 40 MG capsule Take 1 capsule (40 mg total) by mouth daily. 90 capsule 3   pregabalin  (LYRICA ) 75 MG capsule Take 1 capsule (75 mg total) by mouth 2 (two) times daily. 60 capsule 5   simvastatin  (ZOCOR ) 10 MG tablet TAKE 1 TABLET BY MOUTH EVERYDAY AT BEDTIME 30  tablet 0   No current facility-administered medications on file prior to visit.    BP 130/60   Pulse 72   Temp 98 F (36.7 C) (Oral)   Ht 5\' 9"  (1.753 m)   Wt 269 lb (122 kg)   SpO2 97%   BMI 39.72 kg/m       Objective:   Physical Exam Vitals and nursing note reviewed.  Constitutional:      General: He is not in acute distress.    Appearance: Normal appearance. He is obese. He is not ill-appearing.  HENT:     Head: Normocephalic and atraumatic.     Right Ear: Tympanic membrane, ear canal and external ear normal. There is no impacted cerumen.     Left Ear: Tympanic membrane, ear canal and external ear normal. There is no impacted cerumen.     Nose: Nose normal. No congestion or rhinorrhea.     Mouth/Throat:     Mouth: Mucous membranes are moist.     Pharynx: Oropharynx is clear.  Eyes:     Extraocular Movements: Extraocular movements intact.     Conjunctiva/sclera: Conjunctivae normal.     Pupils: Pupils are equal, round, and reactive to light.  Neck:     Vascular: No carotid bruit.  Cardiovascular:     Rate and Rhythm: Normal rate and regular rhythm.     Pulses: Normal pulses.     Heart sounds: No murmur heard.    No friction rub. No gallop.  Pulmonary:     Effort: Pulmonary effort is normal.     Breath sounds: Normal breath sounds.  Abdominal:     General: Abdomen is flat. Bowel sounds are normal. There is no distension.     Palpations:  Abdomen is soft. There is no mass.     Tenderness: There is no abdominal tenderness. There is no guarding or rebound.     Hernia: No hernia is present.  Musculoskeletal:        General: Normal range of motion.     Cervical back: Normal range of motion and neck supple.  Lymphadenopathy:     Cervical: No cervical adenopathy.  Skin:    General: Skin is warm and dry.     Capillary Refill: Capillary refill takes less than 2 seconds.  Neurological:     General: No focal deficit present.     Mental Status: He is alert and oriented to person, place, and time.  Psychiatric:        Mood and Affect: Mood normal.        Behavior: Behavior normal.        Thought Content: Thought content normal.        Judgment: Judgment normal.       Assessment & Plan:   1. Routine general medical examination at a health care facility (Primary) Today patient counseled on age appropriate routine health concerns for screening and prevention, each reviewed and up to date or declined. Immunizations reviewed and up to date or declined. Labs ordered and reviewed. Risk factors for depression reviewed and negative. Hearing function and visual acuity are intact. ADLs screened and addressed as needed. Functional ability and level of safety reviewed and appropriate. Education, counseling and referrals performed based on assessed risks today. Patient provided with a copy of personalized plan for preventive services. - Continue with weight loss measures - Follow up in one year or sooner if needed  2. Essential hypertension - Controlled. No change in medication  - Lipid  panel; Future - TSH; Future - CBC; Future - Comprehensive metabolic panel with GFR; Future  3. Hyperlipidemia, unspecified hyperlipidemia type - Consider dose change in statin  - Lipid panel; Future - TSH; Future - CBC; Future - Comprehensive metabolic panel with GFR; Future  4. OSA (obstructive sleep apnea) - Continue with CPAP  - Lipid panel;  Future - TSH; Future - CBC; Future - Comprehensive metabolic panel with GFR; Future  5. Gastroesophageal reflux disease without esophagitis - Continue PPI  - Lipid panel; Future - TSH; Future - CBC; Future - Comprehensive metabolic panel with GFR; Future  6. Neuropathy - Follow up with Neurology as directed  - Lipid panel; Future - TSH; Future - CBC; Future - Comprehensive metabolic panel with GFR; Future  7. Prostate cancer screening  - PSA; Future  Alto Atta, NP

## 2023-10-18 ENCOUNTER — Ambulatory Visit: Payer: Self-pay | Admitting: Adult Health

## 2023-10-19 NOTE — Progress Notes (Signed)
 NEUROLOGY FOLLOW UP OFFICE NOTE  [name] [id number]  Assessment/Plan:   Probable small fiber neuropathy Bilateral carpal tunnel syndrome (severe on right)      1  Increase pregablin to 100mg  twice daily.  Contact me in 8 weeks with update and we can increase to 150mg  twice daily if needed. 2  Refer to hand surgeon regarding carpal tunnel syndrome 3  Advised to refrain from driving if he cannot appropriately feel the break and gas pedal below his foot. 4  Follow up with PCP regarding blood pressure 5  Follow up 6 months.     Subjective:  [name] is a 78 year old right-handed male with HTN, HLD, arthritis and OSA who follows up for neuropathy.  He is accompanied by his daughter who also provides history.  UPDATE: Current medications: pregablin 75mg  BID  Repeat B12 was >2000, so he was advised to discontinue supplement.  Increased pregablin to 75mg  twice daily.  Minimal benefit.  Difficult to drive due to the burning in the feet which causes him to not feel the break and gas pedal.    He was advised to wear wrist splints at night.  They help for awhile but still gets the numbness off and on.     HISTORY: Patient reports bilateral foot pain for past 6 months.  Involves bottom of both feet at the arches.  A burning sensation.  Denies back pain or radicular pain down the legs.  Usually bothersome when on his feet but not laying down.  He has arthritis and onychomycosis of toenails in both feet contributing to pain as well.  He also reports bilateral hand numbness and pain for about a year.  Numbness and tingling and sharp pain involves all fingers except for thumb.  It happens throughout the day but also states it can wake him up at night.  No neck pain.  Wears gloves for arthritic pain which does not help.  Symptoms in the feet overall stable.  Symptoms in the hands have slightly worsened over the year.  Hgb A1c from May 2023 was 5.8 and TSH was 1.40.  NCV-EMG of right  upper and lower extremities on 03/23/2022 personally reviewed revealed evidence of severe right carpal tunnel syndrome, as well as moderate right ulnar neuropathy across the elbow, and chronic right L5 radiculopathy but no evidence of large fiber sensorimotor polyneuropathy.  Neuropathy labs from 06/12/2022:  negative ANA/dsDNA/Scl70/SSA/SSB abs, ACE 47, B1 13, B6 4.6, IFE with polyclonal increase IgA but no M-spike, but B12 245.  Advised to start OTC supplement.  Repeat B12 level was 754 but symptoms did not improve.   Past medications:  gabapentin .  PAST MEDICAL HISTORY: Past Medical History:  Diagnosis Date   Arthritis    Cataract    right eye removed    Complication of anesthesia    "hard to wake up post op" per pt and wife    GERD (gastroesophageal reflux disease)    Hyperlipidemia    Hypertension    Neuromuscular disorder (HCC)    pinched nerve in foot   Sleep apnea    wears cpap     MEDICATIONS: Current Outpatient Medications on File Prior to Visit  Medication Sig Dispense Refill   amLODipine  (NORVASC ) 10 MG tablet Take 1 tablet (10 mg total) by mouth daily. 90 tablet 3   aspirin  81 MG tablet Take 81 mg by mouth daily.     hydrochlorothiazide  (HYDRODIURIL ) 25 MG tablet TAKE 1 TABLET BY  MOUTH EVERY DAY 90 tablet 0   losartan  (COZAAR ) 50 MG tablet Take 1 tablet (50 mg total) by mouth daily. 90 tablet 3   omeprazole  (PRILOSEC) 40 MG capsule Take 1 capsule (40 mg total) by mouth daily. 90 capsule 3   pregabalin  (LYRICA ) 75 MG capsule Take 1 capsule (75 mg total) by mouth 2 (two) times daily. 60 capsule 5   simvastatin  (ZOCOR ) 10 MG tablet TAKE 1 TABLET BY MOUTH EVERYDAY AT BEDTIME 30 tablet 0   No current facility-administered medications on file prior to visit.    ALLERGIES: Allergies  Allergen Reactions   Lisinopril  Cough    FAMILY HISTORY: Family History  Problem Relation Age of Onset   Diabetes Sister    Stroke Sister    Colon cancer Neg Hx    Colon polyps Neg Hx     Esophageal cancer Neg Hx    Rectal cancer Neg Hx    Stomach cancer Neg Hx       Objective:  Blood pressure (!) 140/65, pulse 71, height 5\' 9"  (1.753 m), weight 270 lb 12.8 oz (122.8 kg), SpO2 98%. General: No acute distress.  Patient appears well-groomed.   Head:  Normocephalic/atraumatic Neck:  Supple.  No paraspinal tenderness.  Full range of motion. Heart:  Regular rate and rhythm. Neuro:  Alert and oriented.  Speech fluent and not dysarthric.  Language intact.  CN II-XII intact.  Bulk and tone normal.  Muscle strength 5/5 throughout.  Sensation to light touch reduced in first 3 digits bilaterally.  Tinel's sign negative.  Pinprick sensation reduced in toes bilaterally.  Vibratory sensation intact.  Deep tendon reflexes 2+ throughout, toes downgoing.  Gait normal.  Romberg negative.     Janne Members, DO  CC: Alto Atta, NP

## 2023-10-22 ENCOUNTER — Encounter: Payer: Self-pay | Admitting: Neurology

## 2023-10-22 ENCOUNTER — Ambulatory Visit: Admitting: Neurology

## 2023-10-22 VITALS — BP 140/65 | HR 71 | Ht 69.0 in | Wt 270.8 lb

## 2023-10-22 DIAGNOSIS — G5601 Carpal tunnel syndrome, right upper limb: Secondary | ICD-10-CM

## 2023-10-22 DIAGNOSIS — G629 Polyneuropathy, unspecified: Secondary | ICD-10-CM | POA: Diagnosis not present

## 2023-10-22 MED ORDER — PREGABALIN 100 MG PO CAPS: 100.0000 mg | ORAL_CAPSULE | Freq: Two times a day (BID) | ORAL | 5 refills | Status: DC

## 2023-10-22 NOTE — Patient Instructions (Signed)
 Increase pregablin to 100mg  twice daily.  If no improvement in 8 weeks, contact me Refer to hand surgery regarding carpal tunnel syndrome Follow up 6 months.

## 2023-11-03 DIAGNOSIS — G4733 Obstructive sleep apnea (adult) (pediatric): Secondary | ICD-10-CM | POA: Diagnosis not present

## 2023-11-04 ENCOUNTER — Other Ambulatory Visit: Payer: Self-pay | Admitting: Adult Health

## 2023-11-04 DIAGNOSIS — E785 Hyperlipidemia, unspecified: Secondary | ICD-10-CM

## 2023-11-15 DIAGNOSIS — R202 Paresthesia of skin: Secondary | ICD-10-CM | POA: Diagnosis not present

## 2023-11-15 DIAGNOSIS — R200 Anesthesia of skin: Secondary | ICD-10-CM | POA: Diagnosis not present

## 2023-12-10 ENCOUNTER — Other Ambulatory Visit: Payer: Self-pay | Admitting: Adult Health

## 2023-12-10 DIAGNOSIS — I10 Essential (primary) hypertension: Secondary | ICD-10-CM

## 2023-12-14 ENCOUNTER — Ambulatory Visit: Admitting: Podiatry

## 2023-12-14 VITALS — Ht 69.0 in | Wt 270.0 lb

## 2023-12-14 DIAGNOSIS — B353 Tinea pedis: Secondary | ICD-10-CM

## 2023-12-14 DIAGNOSIS — M79675 Pain in left toe(s): Secondary | ICD-10-CM

## 2023-12-14 DIAGNOSIS — B351 Tinea unguium: Secondary | ICD-10-CM

## 2023-12-14 DIAGNOSIS — M79674 Pain in right toe(s): Secondary | ICD-10-CM | POA: Diagnosis not present

## 2023-12-14 DIAGNOSIS — G629 Polyneuropathy, unspecified: Secondary | ICD-10-CM | POA: Diagnosis not present

## 2023-12-14 NOTE — Progress Notes (Signed)
 Subjective: Chief Complaint  Patient presents with   Nail Problem    Rm 11 Patient is here for routine foot care and nail trimming. Patient would like more information about Nervive cream for nerve pain in both feet.   78 y.o. returns the office today for painful, elongated, thickened toenails which he cannot trim himself. He is using OTC medications for the nails at this time.   He still gets tingling to the foot. He is still on Lyrica . He does follow with neurology.   PCP: Merna Huxley, NP Last Seen: 01/26/23  Objective: AAO 3, NAD DP/PT pulses palpable, CRT less than 3 seconds Protective sensation decreased with Semmes Weinstein monofilament Nails hypertrophic, dystrophic, elongated, brittle, discolored 10. There is tenderness overlying the nails 1-5 bilaterally. There is no surrounding erythema or drainage along the nail sites. No open lesions or pre-ulcerative lesions are identified. Dry skin present plantar aspect from the arch.  No skin breakdown or warmth. Macerated skin noted along the first interspace of the left foot without any drainage or pus. No pain with calf compression, swelling, warmth, erythema.  Assessment: Symptomatic onychomycosis  Plan: Symptomatic onychomycosis -Nails sharply debrided 10 without complication/bleeding. He is using OTC medication.  Tinea pedis, maceration -Discussed drying thoroughly between the toes.  Applied Betadine to the left first interspace today. -Continue moisturizer for the feet daily.  Do not apply interdigitally.   Neuropathy -Continue pregabalin  for neuropathy.  Discussed over-the-counter medications as well. -Discussed daily foot inspection. If there are any changes, to call the office immediately.    -Follow-up in 3 months or sooner if any problems are to arise. In the meantime, encouraged to call the office with any questions, concerns, changes symptoms.   Donnice Fees, DPM

## 2023-12-15 ENCOUNTER — Other Ambulatory Visit: Payer: Self-pay | Admitting: Adult Health

## 2023-12-15 DIAGNOSIS — I10 Essential (primary) hypertension: Secondary | ICD-10-CM

## 2023-12-15 DIAGNOSIS — Z760 Encounter for issue of repeat prescription: Secondary | ICD-10-CM

## 2023-12-17 ENCOUNTER — Ambulatory Visit: Payer: HMO | Admitting: Neurology

## 2023-12-18 ENCOUNTER — Other Ambulatory Visit: Payer: Self-pay | Admitting: Neurology

## 2023-12-18 ENCOUNTER — Telehealth: Payer: Self-pay | Admitting: Neurology

## 2023-12-18 MED ORDER — PREGABALIN 150 MG PO CAPS: 150.0000 mg | ORAL_CAPSULE | Freq: Two times a day (BID) | ORAL | 5 refills | Status: DC

## 2023-12-18 NOTE — Telephone Encounter (Signed)
 Patient is taken Lyrica  100mg  bid please advise on recommendations

## 2023-12-18 NOTE — Telephone Encounter (Signed)
 Pt's daughter called in stating the pt wants to up the dosage for his nerve pain. He can feel a difference with the current dose, but he still thinks he needs a larger dose.

## 2023-12-18 NOTE — Telephone Encounter (Signed)
 Patient advised of Lyrica  increase dose to pharmacy, thanked me for calling.

## 2023-12-19 DIAGNOSIS — G5621 Lesion of ulnar nerve, right upper limb: Secondary | ICD-10-CM | POA: Diagnosis not present

## 2023-12-19 DIAGNOSIS — G5613 Other lesions of median nerve, bilateral upper limbs: Secondary | ICD-10-CM | POA: Diagnosis not present

## 2023-12-19 DIAGNOSIS — G5603 Carpal tunnel syndrome, bilateral upper limbs: Secondary | ICD-10-CM | POA: Diagnosis not present

## 2023-12-29 ENCOUNTER — Other Ambulatory Visit: Payer: Self-pay | Admitting: Adult Health

## 2023-12-29 DIAGNOSIS — K219 Gastro-esophageal reflux disease without esophagitis: Secondary | ICD-10-CM

## 2023-12-29 DIAGNOSIS — I10 Essential (primary) hypertension: Secondary | ICD-10-CM

## 2024-01-29 DIAGNOSIS — G4733 Obstructive sleep apnea (adult) (pediatric): Secondary | ICD-10-CM | POA: Diagnosis not present

## 2024-03-11 NOTE — Progress Notes (Signed)
   03/11/2024  Patient ID: Marsha LITTIE Sorrel, male   DOB: Aug 22, 1945, 78 y.o.   MRN: [id number]  Pharmacy Quality Measure Review  This patient is appearing on a report for being at risk of failing the adherence measure for cholesterol (statin) medications this calendar year.   Medication: Simvastatin  Last fill date: 02/13/24 for 90 day supply  Insurance report was not up to date. No action needed at this time.   Jon VEAR Lindau, PharmD Clinical Pharmacist [phone]

## 2024-03-17 ENCOUNTER — Ambulatory Visit: Admitting: Podiatry

## 2024-03-17 DIAGNOSIS — M79675 Pain in left toe(s): Secondary | ICD-10-CM

## 2024-03-17 DIAGNOSIS — M79674 Pain in right toe(s): Secondary | ICD-10-CM

## 2024-03-17 DIAGNOSIS — B351 Tinea unguium: Secondary | ICD-10-CM

## 2024-03-17 NOTE — Progress Notes (Signed)
 Subjective: Chief Complaint  Patient presents with   Nail Problem    Nail trim     78 y.o. returns the office today for painful, elongated, thickened toenails which he cannot trim himself. He is using OTC medications for the nails at this time.    PCP: Merna Huxley, NP Last Seen: 01/26/23  Objective: AAO 3, NAD DP/PT pulses palpable, CRT less than 3 seconds Protective sensation decreased with Semmes Weinstein monofilament Nails hypertrophic, dystrophic, elongated, brittle, discolored 10. There is tenderness overlying the nails 1-5 bilaterally. There is no surrounding erythema or drainage along the nail sites. No open lesions or pre-ulcerative lesions are identified. No pain with calf compression, swelling, warmth, erythema.  Assessment: Symptomatic onychomycosis  Plan: Symptomatic onychomycosis -Nails sharply debrided 10 without complication/bleeding. Continue topical medication (using OTC).   Neuropathy -Continue pregabalin  for neuropathy. Follows with neurology.  -Discussed daily foot inspection. If there are any changes, to call the office immediately.   No follow-ups on file.  [name] DPM

## 2024-03-29 ENCOUNTER — Other Ambulatory Visit: Payer: Self-pay | Admitting: Podiatry

## 2024-04-09 ENCOUNTER — Telehealth: Payer: Self-pay

## 2024-04-09 NOTE — Telephone Encounter (Signed)
 PA request received fr Ciclopirox  8% solution. PA submitted and waiting on response.  Trevone l Grieger  (Key: [id number])

## 2024-04-15 NOTE — Telephone Encounter (Signed)
 PA was denied

## 2024-04-17 ENCOUNTER — Telehealth: Payer: Self-pay

## 2024-04-17 NOTE — Progress Notes (Signed)
 Attempted to follow up, unable to reach. Left a voicemail with direct call back number.   Woodie Jock, PharmD PGY1 Pharmacy Resident  04/17/2024

## 2024-04-17 NOTE — Progress Notes (Signed)
 Patient appearing on report for [employer] - Hypertension Control report due to last documented ambulatory blood pressure of 140/65 on 10/22/2023. Next appointment with PCP is NEEDS SCHEDULED.   Outreached patient to discuss hypertension control and medication management. Spoke with Terre over the phone, he states that he is not available at this time and would prefer a call back at [facility] today. Will follow up.  Woodie Jock, PharmD PGY1 Pharmacy Resident  04/17/2024

## 2024-05-10 ENCOUNTER — Other Ambulatory Visit: Payer: Self-pay | Admitting: Adult Health

## 2024-05-10 DIAGNOSIS — E785 Hyperlipidemia, unspecified: Secondary | ICD-10-CM

## 2024-05-16 NOTE — Progress Notes (Signed)
 [name]                                          MRN: [id number]   05/16/2024   The [facility] Quality Team Specialist reviewed this patient medical record for the purposes of chart review for care gap closure. The following were reviewed: abstraction for care gap closure-controlling blood pressure.    [facility] Quality Team

## 2024-05-26 NOTE — Progress Notes (Unsigned)
 "  NEUROLOGY FOLLOW UP OFFICE NOTE  [name] [id number]  Assessment/Plan:   Probable small fiber neuropathy Bilateral carpal tunnel syndrome (severe on right)      1  Increase pregablin to 100mg  twice daily.  Contact me in 8 weeks with update and we can increase to 150mg  twice daily if needed. 2  Refer to hand surgeon regarding carpal tunnel syndrome 3  Advised to refrain from driving if he cannot appropriately feel the break and gas pedal below his foot. 4  Follow up with PCP regarding blood pressure 5  Follow up 6 months.     Subjective:  [name] is a 79 year old right-handed male with HTN, HLD, arthritis and OSA who follows up for neuropathy.  He is accompanied by his daughter who also provides history.  UPDATE: Current medications: pregablin 150mg  BID  Increased pregablin to 100mg  twice daily.  Ineffective, so it was further increased to 150mg  twice daily.  ***  Regarding carpal tunnel syndrome, he was referred to a hand surgeon.  ***   HISTORY: Patient reports bilateral foot pain for past 6 months.  Involves bottom of both feet at the arches.  A burning sensation.  Denies back pain or radicular pain down the legs.  Usually bothersome when on his feet but not laying down.  He has arthritis and onychomycosis of toenails in both feet contributing to pain as well.  He also reports bilateral hand numbness and pain for about a year.  Numbness and tingling and sharp pain involves all fingers except for thumb.  It happens throughout the day but also states it can wake him up at night.  No neck pain.  Wears gloves for arthritic pain which does not help.  Symptoms in the feet overall stable.  Symptoms in the hands have slightly worsened over the year.  Hgb A1c from May 2023 was 5.8 and TSH was 1.40.  NCV-EMG of right upper and lower extremities on 03/23/2022 personally reviewed revealed evidence of severe right carpal tunnel syndrome, as well as moderate right ulnar neuropathy  across the elbow, and chronic right L5 radiculopathy but no evidence of large fiber sensorimotor polyneuropathy.  Neuropathy labs from 06/12/2022:  negative ANA/dsDNA/Scl70/SSA/SSB abs, ACE 47, B1 13, B6 4.6, IFE with polyclonal increase IgA but no M-spike, but B12 245.  Advised to start OTC supplement.  Repeat B12 level was 754 but symptoms did not improve.   Past medications:  gabapentin .  PAST MEDICAL HISTORY: Past Medical History:  Diagnosis Date   Arthritis    Cataract    right eye removed    Complication of anesthesia    hard to wake up post op per pt and wife    GERD (gastroesophageal reflux disease)    Hyperlipidemia    Hypertension    Neuromuscular disorder (HCC)    pinched nerve in foot   Sleep apnea    wears cpap     MEDICATIONS: Current Outpatient Medications on File Prior to Visit  Medication Sig Dispense Refill   amLODipine  (NORVASC ) 10 MG tablet TAKE 1 TABLET BY MOUTH EVERY DAY 90 tablet 3   ammonium lactate  (AMLACTIN) 12 % cream APPLY 1 APPLICATION TOPICALLY AS NEEDED FOR DRY SKIN. 385 g 0   aspirin  81 MG tablet Take 81 mg by mouth daily.     hydrochlorothiazide  (HYDRODIURIL ) 25 MG tablet TAKE 1 TABLET BY MOUTH EVERY DAY 90 tablet 1   losartan  (COZAAR ) 50 MG tablet TAKE 1 TABLET BY MOUTH  EVERY DAY 90 tablet 3   omeprazole  (PRILOSEC) 40 MG capsule TAKE 1 CAPSULE (40 MG TOTAL) BY MOUTH DAILY. 90 capsule 3   pregabalin  (LYRICA ) 150 MG capsule Take 1 capsule (150 mg total) by mouth 2 (two) times daily. 60 capsule 5   simvastatin  (ZOCOR ) 10 MG tablet TAKE 1 TABLET BY MOUTH EVERYDAY AT BEDTIME 90 tablet 1   No current facility-administered medications on file prior to visit.    ALLERGIES: Allergies  Allergen Reactions   Lisinopril  Cough    FAMILY HISTORY: Family History  Problem Relation Age of Onset   Diabetes Sister    Stroke Sister    Colon cancer Neg Hx    Colon polyps Neg Hx    Esophageal cancer Neg Hx    Rectal cancer Neg Hx    Stomach cancer Neg Hx        Objective:  *** General: No acute distress.  Patient appears well-groomed.   Head:  Normocephalic/atraumatic Neck:  Supple.  No paraspinal tenderness.  Full range of motion. Heart:  Regular rate and rhythm. Neuro:  Alert and oriented.  Speech fluent and not dysarthric.  Language intact.  CN II-XII intact.  Bulk and tone normal.  Muscle strength 5/5 throughout.  Sensation to light touch reduced in first 3 digits bilaterally.  Tinel's sign negative.  Pinprick sensation reduced in toes bilaterally.  Vibratory sensation intact.  Deep tendon reflexes 2+ throughout, toes downgoing.  Gait normal.  Romberg negative. ***     Juliene Dunnings, DO  CC: Darleene Shape, NP ***       "

## 2024-05-27 ENCOUNTER — Ambulatory Visit: Admitting: Neurology

## 2024-05-27 ENCOUNTER — Encounter: Payer: Self-pay | Admitting: Neurology

## 2024-05-27 VITALS — BP 138/74 | HR 87 | Ht 69.0 in | Wt 271.2 lb

## 2024-05-27 DIAGNOSIS — G5601 Carpal tunnel syndrome, right upper limb: Secondary | ICD-10-CM | POA: Diagnosis not present

## 2024-05-27 DIAGNOSIS — G629 Polyneuropathy, unspecified: Secondary | ICD-10-CM

## 2024-05-27 MED ORDER — DULOXETINE HCL 60 MG PO CPEP: 60.0000 mg | ORAL_CAPSULE | Freq: Every day | ORAL | 5 refills | Status: AC

## 2024-05-27 MED ORDER — SUPPORT COMPRESSION SOCK MENS MISC: 1 refills | Status: AC

## 2024-05-27 MED ORDER — DULOXETINE HCL 30 MG PO CPEP: 30.0000 mg | ORAL_CAPSULE | Freq: Every day | ORAL | 0 refills | Status: AC

## 2024-05-27 NOTE — Patient Instructions (Addendum)
 Start duloxetine  30mg  capsule daily for 7 days. Then start duloxetine  60mg  capsule daily.  If no improvement in pain after 8 weeks, contact me Will prescribe compression stockings 13 triple E Follow up 6 months.

## 2024-06-07 ENCOUNTER — Other Ambulatory Visit: Payer: Self-pay | Admitting: Adult Health

## 2024-06-07 DIAGNOSIS — Z760 Encounter for issue of repeat prescription: Secondary | ICD-10-CM

## 2024-06-07 DIAGNOSIS — I10 Essential (primary) hypertension: Secondary | ICD-10-CM

## 2024-06-17 ENCOUNTER — Ambulatory Visit: Admitting: Podiatry

## 2024-06-18 ENCOUNTER — Other Ambulatory Visit: Payer: Self-pay | Admitting: Neurology

## 2024-07-15 ENCOUNTER — Ambulatory Visit: Admitting: Podiatry

## 2024-12-08 ENCOUNTER — Ambulatory Visit: Payer: Self-pay | Admitting: Neurology
